# Patient Record
Sex: Female | Born: 1960 | Race: Black or African American | Hispanic: No | Smoking: Never smoker
Health system: Southern US, Community
[De-identification: ages and names within clinical notes are randomized; demographics above are authoritative.]

## PROBLEM LIST (undated history)

## (undated) DIAGNOSIS — E0590 Thyrotoxicosis, unspecified without thyrotoxic crisis or storm: Secondary | ICD-10-CM

## (undated) DIAGNOSIS — J309 Allergic rhinitis, unspecified: Secondary | ICD-10-CM

## (undated) DIAGNOSIS — N9489 Other specified conditions associated with female genital organs and menstrual cycle: Secondary | ICD-10-CM

## (undated) DIAGNOSIS — N760 Acute vaginitis: Secondary | ICD-10-CM

## (undated) DIAGNOSIS — L732 Hidradenitis suppurativa: Secondary | ICD-10-CM

## (undated) DIAGNOSIS — K5790 Diverticulosis of intestine, part unspecified, without perforation or abscess without bleeding: Secondary | ICD-10-CM

## (undated) DIAGNOSIS — I10 Essential (primary) hypertension: Secondary | ICD-10-CM

## (undated) DIAGNOSIS — D649 Anemia, unspecified: Secondary | ICD-10-CM

## (undated) DIAGNOSIS — K219 Gastro-esophageal reflux disease without esophagitis: Secondary | ICD-10-CM

## (undated) DIAGNOSIS — B9689 Other specified bacterial agents as the cause of diseases classified elsewhere: Secondary | ICD-10-CM

## (undated) DIAGNOSIS — E559 Vitamin D deficiency, unspecified: Secondary | ICD-10-CM

## (undated) DIAGNOSIS — F419 Anxiety disorder, unspecified: Secondary | ICD-10-CM

## (undated) DIAGNOSIS — M069 Rheumatoid arthritis, unspecified: Secondary | ICD-10-CM

## (undated) DIAGNOSIS — N189 Chronic kidney disease, unspecified: Secondary | ICD-10-CM

## (undated) DIAGNOSIS — E669 Obesity, unspecified: Secondary | ICD-10-CM

## (undated) DIAGNOSIS — T8859XA Other complications of anesthesia, initial encounter: Secondary | ICD-10-CM

## (undated) HISTORY — PX: CHOLECYSTECTOMY: SHX55

## (undated) HISTORY — DX: Chronic kidney disease, unspecified: N18.9

## (undated) HISTORY — DX: Obesity, unspecified: E66.9

## (undated) HISTORY — DX: Diverticulosis of intestine, part unspecified, without perforation or abscess without bleeding: K57.90

## (undated) HISTORY — DX: Other specified bacterial agents as the cause of diseases classified elsewhere: B96.89

## (undated) HISTORY — DX: Anemia, unspecified: D64.9

## (undated) HISTORY — DX: Essential (primary) hypertension: I10

## (undated) HISTORY — DX: Other specified conditions associated with female genital organs and menstrual cycle: N94.89

## (undated) HISTORY — PX: DILATION AND CURETTAGE OF UTERUS: SHX78

## (undated) HISTORY — DX: Vitamin D deficiency, unspecified: E55.9

## (undated) HISTORY — PX: AXILLARY HIDRADENITIS EXCISION: SUR522

## (undated) HISTORY — DX: Gastro-esophageal reflux disease without esophagitis: K21.9

## (undated) HISTORY — DX: Allergic rhinitis, unspecified: J30.9

## (undated) HISTORY — DX: Acute vaginitis: N76.0

## (undated) HISTORY — DX: Rheumatoid arthritis, unspecified: M06.9

---

## 1998-07-18 ENCOUNTER — Emergency Department (HOSPITAL_COMMUNITY): Admission: EM | Admit: 1998-07-18 | Discharge: 1998-07-18 | Payer: Self-pay

## 2000-04-11 ENCOUNTER — Emergency Department (HOSPITAL_COMMUNITY): Admission: EM | Admit: 2000-04-11 | Discharge: 2000-04-11 | Payer: Self-pay | Admitting: *Deleted

## 2001-05-02 ENCOUNTER — Emergency Department (HOSPITAL_COMMUNITY): Admission: EM | Admit: 2001-05-02 | Discharge: 2001-05-02 | Payer: Self-pay | Admitting: Emergency Medicine

## 2001-06-12 ENCOUNTER — Other Ambulatory Visit: Admission: RE | Admit: 2001-06-12 | Discharge: 2001-06-12 | Payer: Self-pay | Admitting: Obstetrics and Gynecology

## 2001-09-30 ENCOUNTER — Emergency Department (HOSPITAL_COMMUNITY): Admission: EM | Admit: 2001-09-30 | Discharge: 2001-09-30 | Payer: Self-pay | Admitting: Emergency Medicine

## 2002-03-20 ENCOUNTER — Emergency Department (HOSPITAL_COMMUNITY): Admission: EM | Admit: 2002-03-20 | Discharge: 2002-03-20 | Payer: Self-pay | Admitting: Emergency Medicine

## 2002-06-24 ENCOUNTER — Other Ambulatory Visit: Admission: RE | Admit: 2002-06-24 | Discharge: 2002-06-24 | Payer: Self-pay | Admitting: Obstetrics and Gynecology

## 2002-07-13 ENCOUNTER — Emergency Department (HOSPITAL_COMMUNITY): Admission: EM | Admit: 2002-07-13 | Discharge: 2002-07-13 | Payer: Self-pay | Admitting: Emergency Medicine

## 2002-09-26 ENCOUNTER — Emergency Department (HOSPITAL_COMMUNITY): Admission: EM | Admit: 2002-09-26 | Discharge: 2002-09-26 | Payer: Self-pay | Admitting: Emergency Medicine

## 2002-09-26 ENCOUNTER — Encounter: Payer: Self-pay | Admitting: Emergency Medicine

## 2003-09-15 ENCOUNTER — Other Ambulatory Visit: Admission: RE | Admit: 2003-09-15 | Discharge: 2003-09-15 | Payer: Self-pay | Admitting: Obstetrics and Gynecology

## 2004-09-17 ENCOUNTER — Other Ambulatory Visit: Admission: RE | Admit: 2004-09-17 | Discharge: 2004-09-17 | Payer: Self-pay | Admitting: Obstetrics and Gynecology

## 2004-12-03 ENCOUNTER — Encounter: Admission: RE | Admit: 2004-12-03 | Discharge: 2004-12-03 | Payer: Self-pay | Admitting: Family Medicine

## 2005-09-19 ENCOUNTER — Other Ambulatory Visit: Admission: RE | Admit: 2005-09-19 | Discharge: 2005-09-19 | Payer: Self-pay | Admitting: Obstetrics and Gynecology

## 2005-12-05 ENCOUNTER — Encounter: Admission: RE | Admit: 2005-12-05 | Discharge: 2005-12-05 | Payer: Self-pay | Admitting: Family Medicine

## 2006-01-06 ENCOUNTER — Emergency Department (HOSPITAL_COMMUNITY): Admission: EM | Admit: 2006-01-06 | Discharge: 2006-01-06 | Payer: Self-pay | Admitting: Emergency Medicine

## 2006-04-22 ENCOUNTER — Encounter (INDEPENDENT_AMBULATORY_CARE_PROVIDER_SITE_OTHER): Payer: Self-pay | Admitting: Specialist

## 2006-04-22 ENCOUNTER — Ambulatory Visit (HOSPITAL_BASED_OUTPATIENT_CLINIC_OR_DEPARTMENT_OTHER): Admission: RE | Admit: 2006-04-22 | Discharge: 2006-04-22 | Payer: Self-pay | Admitting: General Surgery

## 2006-12-08 ENCOUNTER — Encounter: Admission: RE | Admit: 2006-12-08 | Discharge: 2006-12-08 | Payer: Self-pay | Admitting: Family Medicine

## 2007-12-15 ENCOUNTER — Encounter: Admission: RE | Admit: 2007-12-15 | Discharge: 2007-12-15 | Payer: Self-pay | Admitting: Family Medicine

## 2008-07-15 ENCOUNTER — Ambulatory Visit (HOSPITAL_COMMUNITY): Admission: RE | Admit: 2008-07-15 | Discharge: 2008-07-15 | Payer: Self-pay | Admitting: Obstetrics and Gynecology

## 2008-07-15 ENCOUNTER — Encounter (INDEPENDENT_AMBULATORY_CARE_PROVIDER_SITE_OTHER): Payer: Self-pay | Admitting: Obstetrics and Gynecology

## 2008-07-15 HISTORY — PX: HYSTEROSCOPY W/D&C: SHX1775

## 2008-10-02 ENCOUNTER — Emergency Department (HOSPITAL_COMMUNITY): Admission: EM | Admit: 2008-10-02 | Discharge: 2008-10-03 | Payer: Self-pay | Admitting: Emergency Medicine

## 2008-10-28 ENCOUNTER — Emergency Department (HOSPITAL_COMMUNITY): Admission: EM | Admit: 2008-10-28 | Discharge: 2008-10-28 | Payer: Self-pay | Admitting: Emergency Medicine

## 2008-11-25 ENCOUNTER — Emergency Department (HOSPITAL_COMMUNITY): Admission: EM | Admit: 2008-11-25 | Discharge: 2008-11-25 | Payer: Self-pay | Admitting: Emergency Medicine

## 2008-12-15 ENCOUNTER — Encounter: Admission: RE | Admit: 2008-12-15 | Discharge: 2008-12-15 | Payer: Self-pay | Admitting: Family Medicine

## 2009-01-02 ENCOUNTER — Ambulatory Visit (HOSPITAL_COMMUNITY): Admission: RE | Admit: 2009-01-02 | Discharge: 2009-01-02 | Payer: Self-pay | Admitting: General Surgery

## 2009-01-02 ENCOUNTER — Encounter (INDEPENDENT_AMBULATORY_CARE_PROVIDER_SITE_OTHER): Payer: Self-pay | Admitting: General Surgery

## 2009-05-13 ENCOUNTER — Emergency Department (HOSPITAL_COMMUNITY): Admission: EM | Admit: 2009-05-13 | Discharge: 2009-05-14 | Payer: Self-pay | Admitting: Emergency Medicine

## 2009-10-22 ENCOUNTER — Emergency Department (HOSPITAL_COMMUNITY): Admission: EM | Admit: 2009-10-22 | Discharge: 2009-10-22 | Payer: Self-pay | Admitting: Family Medicine

## 2009-11-23 ENCOUNTER — Ambulatory Visit (HOSPITAL_COMMUNITY): Admission: RE | Admit: 2009-11-23 | Discharge: 2009-11-23 | Payer: Self-pay | Admitting: Obstetrics and Gynecology

## 2009-11-23 HISTORY — PX: HYSTEROSCOPY W/ ENDOMETRIAL ABLATION: SUR665

## 2010-01-01 ENCOUNTER — Encounter: Admission: RE | Admit: 2010-01-01 | Discharge: 2010-01-01 | Payer: Self-pay | Admitting: Family Medicine

## 2010-01-08 ENCOUNTER — Emergency Department (HOSPITAL_COMMUNITY)
Admission: EM | Admit: 2010-01-08 | Discharge: 2010-01-08 | Payer: Self-pay | Source: Home / Self Care | Admitting: Family Medicine

## 2010-06-08 ENCOUNTER — Ambulatory Visit: Payer: Self-pay | Admitting: Nurse Practitioner

## 2010-06-08 DIAGNOSIS — E669 Obesity, unspecified: Secondary | ICD-10-CM

## 2010-06-08 DIAGNOSIS — A6000 Herpesviral infection of urogenital system, unspecified: Secondary | ICD-10-CM | POA: Insufficient documentation

## 2010-06-08 DIAGNOSIS — L732 Hidradenitis suppurativa: Secondary | ICD-10-CM

## 2010-06-08 DIAGNOSIS — K219 Gastro-esophageal reflux disease without esophagitis: Secondary | ICD-10-CM | POA: Insufficient documentation

## 2010-06-13 ENCOUNTER — Encounter (INDEPENDENT_AMBULATORY_CARE_PROVIDER_SITE_OTHER): Payer: Self-pay | Admitting: Internal Medicine

## 2010-06-14 ENCOUNTER — Encounter (INDEPENDENT_AMBULATORY_CARE_PROVIDER_SITE_OTHER): Payer: Self-pay | Admitting: Nurse Practitioner

## 2010-06-22 ENCOUNTER — Telehealth (INDEPENDENT_AMBULATORY_CARE_PROVIDER_SITE_OTHER): Payer: Self-pay | Admitting: Nurse Practitioner

## 2010-07-01 ENCOUNTER — Encounter: Payer: Self-pay | Admitting: Family Medicine

## 2010-07-09 ENCOUNTER — Telehealth (INDEPENDENT_AMBULATORY_CARE_PROVIDER_SITE_OTHER): Payer: Self-pay | Admitting: Nurse Practitioner

## 2010-07-11 ENCOUNTER — Encounter: Payer: Self-pay | Admitting: Internal Medicine

## 2010-07-11 ENCOUNTER — Telehealth (INDEPENDENT_AMBULATORY_CARE_PROVIDER_SITE_OTHER): Payer: Self-pay | Admitting: Internal Medicine

## 2010-07-12 ENCOUNTER — Encounter (INDEPENDENT_AMBULATORY_CARE_PROVIDER_SITE_OTHER): Payer: Self-pay | Admitting: Internal Medicine

## 2010-07-12 NOTE — Letter (Signed)
Summary: REQUESTING RECORDS FROM DR. DEAN MITCHELL  REQUESTING RECORDS FROM DR. DEAN MITCHELL   Imported By: Roland Earl 06/14/2010 11:58:55  _____________________________________________________________________  External Attachment:    Type:   Image     Comment:   External Document

## 2010-07-12 NOTE — Assessment & Plan Note (Signed)
Summary: NEW - Establish care   Vital Signs:  Patient profile:   50 year old female Menstrual status:  endometrial abilation LMP:     12/2009 Height:      63 inches Weight:      260.0 pounds BMI:     46.22 Temp:     97.6 degrees F oral Pulse rate:   88 / minute Pulse rhythm:   regular Resp:     20 per minute BP sitting:   126 / 100  (left arm) Cuff size:   regular  Vitals Entered By: Isla Pence (June 08, 2010 11:32 AM)  Nutrition Counseling: Patient's BMI is greater than 25 and therefore counseled on weight management options. CC: new establish.......Marland Kitchenhidradentis and yeast infection, Abdominal Pain Is Patient Diabetic? No Pain Assessment Patient in pain? no       Does patient need assistance? Functional Status Self care Ambulation Normal LMP (date): 12/2009     Menstrual Status endometrial abilation Enter LMP: 12/2009 Last PAP Result historical per pt   CC:  new establish.......Marland Kitchenhidradentis and yeast infection and Abdominal Pain.  History of Present Illness:  Pt into the office to establish care. Pt was displaced from her job in September. She was working at [employer] as a Research scientist (physical sciences).  Pt was previously going to several specialist: [facility] - Dr. Francesca Oman OB-GYN - Dr. Raphael Gibney Dermatology - Dr. Allyson Sabal  Dyspepsia History:      She has no alarm features of dyspepsia including no history of melena, hematochezia, dysphagia, persistent vomiting, or involuntary weight loss > 5%.  There is a prior history of GERD.  The patient does not have a prior history of documented ulcer disease.  The dominant symptom is heartburn or acid reflux.  An H-2 blocker medication is not currently being taken.     Habits & Providers  Alcohol-Tobacco-Diet     Alcohol drinks/day: 0     Tobacco Status: never  Exercise-Depression-Behavior     Does Patient Exercise: yes     Exercise Counseling: not indicated; exercise is adequate     Type of exercise:  [employer]     Have you felt down or hopeless? no     Have you felt little pleasure in things? no     Depression Counseling: not indicated; screening negative for depression     Drug Use: never  Current Medications (verified): 1)  Alprazolam 0.5 Mg Tabs (Alprazolam) .... Take One Tablet By Mouth 3 Times A Day As Needed *lewis Mitchell 2)  Valacyclovir Hcl 500 Mg Tabs (Valacyclovir Hcl) .... Take One Tablet By Mouth Twice A Day If Needed For Outbreak For 3 Days Then Take 1 Tablet By Mouth Once Daily *carol Amber 3)  Ibuprofen 800 Mg Tabs (Ibuprofen) .... Take One Tablet By Mouth 3 Times A Day If Needed For Pain *dean Alroy Dust 4)  Pantoprazole Sodium 40 Mg Tbec (Pantoprazole Sodium) .... Take One Tablet By Mouth Every Day Donnie Coffin 5)  Ultra Woman  Cr-Tabs (Specialty Vitamins Products) .... Take One Tablet By Mouth Daily  Allergies (verified): No Known Drug Allergies  Family History: mother - htn, diabetes (deceased at age 60) father - htn (deceased at age 33) brother - htn Son - sickle Cell, leukemia (remission)  Social History: 1 child  widow denies any tobacco, ETOH or drug useSmoking Status:  never Drug Use:  never Does Patient Exercise:  yes  Review of Systems CV:  Denies chest pain or discomfort. Resp:  Denies cough.  GI:  Denies abdominal pain, nausea, and vomiting. GU:  Complains of discharge. MS:  Complains of joint pain; pt is taking ibuprofen as needed for aches.. Derm:  Complains of itching and lesion(s).  Physical Exam  General:  alert.   Head:  normocephalic.   Lungs:  normal breath sounds.   Heart:  normal rate and regular rhythm.   Skin:  color normal.   Psych:  Oriented X3.     Impression & Recommendations:  Problem # 1:  HIDRADENITIS SUPPURATIVA (ICD-705.83) handout given pt to take antibiotics will also advise pt to start betasept  Problem # 2:  ELEVATED BLOOD PRESSURE (ICD-796.2) DASH diet no current meds but advised pt to  monitor  Problem # 3:  OBESITY (ICD-278.00) pt to start going to the gym and decrease weight  Problem # 4:  NEED PROPHYLACTIC VACCINATION&INOCULATION FLU (ICD-V04.81) given today in office  Problem # 5:  CANDIDIASIS OF VULVA AND VAGINA (ICD-112.1)  Her updated medication list for this problem includes:    Fluconazole 150 Mg Tabs (Fluconazole) ..... One tablet by mouth x 1 dose  Problem # 6:  GERD (ICD-530.81)  Her updated medication list for this problem includes:    Pantoprazole Sodium 40 Mg Tbec (Pantoprazole sodium) ..... One tablet by mouth for stomach  Complete Medication List: 1)  Alprazolam 0.5 Mg Tabs (Alprazolam) .... One tablet by mouth daily as needed for anxiety 2)  Valacyclovir Hcl 500 Mg Tabs (Valacyclovir hcl) .... One tablet by mouth two times a day for outbreak and then daily for suppression 3)  Ibuprofen 800 Mg Tabs (Ibuprofen) .... Take one tablet by mouth 3 times a day if needed for pain *dean mitchell 4)  Pantoprazole Sodium 40 Mg Tbec (Pantoprazole sodium) .... One tablet by mouth for stomach 5)  Ultra Woman Cr-tabs (Specialty vitamins products) .... Take one tablet by mouth daily 6)  Cephalexin 500 Mg Caps (Cephalexin) .... One capsule by mouth two times a day  **rx by dr. Florene Glen** 7)  Fluconazole 150 Mg Tabs (Fluconazole) .... One tablet by mouth x 1 dose  Other Orders: Flu Vaccine 38yrs + [id number]) Admin 1st Vaccine [id number])  Patient Instructions: 1)  Be sure to sign a release to get records from Dr. [facility]. 2)  Your blood pressure is elevated today.  You should continue to monitor.  If high on next visit then you will need to start on medications. 3)  You should decrease sodium in your diet. 4)  Keep taking your antibiotics for your arms. 5)  You can get Betasept over the counter and apply to underarms.  let this sit for 5 mintutes then rinse off. 6)  Follow up in 3 months for blood pressure. Work on lifestyle  changes. Prescriptions: FLUCONAZOLE 150 MG TABS (FLUCONAZOLE) One tablet by mouth x 1 dose  #1 x 0   Entered and Authorized by:   Aurora Mask FNP   Signed by:   Aurora Mask FNP on 06/08/2010   Method used:   Print then Give to Patient   RxID:   [id number]    Orders Added: 1)  New Patient Level IV [id number] 2)  Flu Vaccine 28yrs + [id number] 3)  Admin 1st Vaccine [id number]   Immunizations Administered:  Influenza Vaccine # 1:    Vaccine Type: Fluvax 3+    Site: right deltoid    Mfr: GlaxoSmithKline    Dose: 0.5 ml    Route: IM    Given by: Hassan Rowan  Craddock    Exp. Date: 12/08/2010    Lot #: [id number]    VIS given: 01/02/10 version given June 08, 2010.  Flu Vaccine Consent Questions:    Do you have a history of severe allergic reactions to this vaccine? no    Any prior history of allergic reactions to egg and/or gelatin? no    Do you have a sensitivity to the preservative Thimersol? no    Do you have a past history of Guillan-Barre Syndrome? no    Do you currently have an acute febrile illness? no    Have you ever had a severe reaction to latex? no    Vaccine information given and explained to patient? yes    Are you currently pregnant? no    ndc  [phone]  Immunizations Administered:  Influenza Vaccine # 1:    Vaccine Type: Fluvax 3+    Site: right deltoid    Mfr: GlaxoSmithKline    Dose: 0.5 ml    Route: IM    Given by: Isla Pence    Exp. Date: 12/08/2010    Lot #: [id number]    VIS given: 01/02/10 version given June 08, 2010.

## 2010-07-12 NOTE — Progress Notes (Signed)
Summary: Wants refill of fluconazole  Phone Note Call from Patient   Summary of Call: REFILL ON THE ANTIBBIOTIC THAT MARTIN PUT HER ON THE 31ST//[facility]//[phone] Initial call taken by: Roland Earl,  June 22, 2010 9:51 AM  Follow-up for Phone Call        Left message on voicemail for pt. to return call.  Sherian Maroon RN  June 22, 2010 6:37 PM  Wants refill of fluconazole, still taking antibiotic.  Says she has yeast infection -- has itching, clear discharge, little odor.  Denied urinary symptoms.   Follow-up by: Sherian Maroon RN,  June 26, 2010 10:47 AM  Additional Follow-up for Phone Call Additional follow up Details #1::        Pt will need nurse visit - no wet prep done on last visit but is she is still having symptoms she will need it this time before med is refilled Additional Follow-up by: Aurora Mask FNP,  June 26, 2010 11:26 AM    Additional Follow-up for Phone Call Additional follow up Details #2::    Pt. states that she is drinking lots of water and it has relieved her symptoms.  She will call back if symptoms reoccur and she needs to be seen.  Advised re use of cotton or cotton-lined undergarments, to wash from front to back with perineal hygiene, don't douche and to avoid scented feminine products, soaps, lotions and powders.  Verbalized understanding and agreement.  Sherian Maroon RN  June 27, 2010 2:33 PM

## 2010-07-12 NOTE — Letter (Signed)
Summary: Handout Printed  Printed Handout:  - Hidradenitis Suppurativa, Sweat Gland Abscess

## 2010-07-18 NOTE — Progress Notes (Signed)
  Phone Note Outgoing Call   Summary of Call: Nora--derm referral Initial call taken by: Mack Hook MD,  July 11, 2010 1:14 PM

## 2010-07-18 NOTE — Assessment & Plan Note (Signed)
Summary: Lesions on body    Vital Signs:  Patient profile:   50 year old female Menstrual status:  endometrial abilation Weight:      259.44 pounds Temp:     97.0 degrees F oral Pulse rate:   106 / minute Pulse rhythm:   regular Resp:     28 per minute BP sitting:   142 / 104  (left arm) Cuff size:   regular  Vitals Entered By: Aquilla Solian CMA (July 11, 2010 11:39 AM) CC: Here for lesions on body that are draining. Very painful. It is a ongoing thing. Also having pain on right shoulder and left leg. Glands on neck are swollen.  Is Patient Diabetic? No Pain Assessment Patient in pain? yes     Location: Right shoulder  Intensity: 2 Type: aching Onset of pain  Constant  Does patient need assistance? Functional Status Self care Ambulation Normal   CC:  Here for lesions on body that are draining. Very painful. It is a ongoing thing. Also having pain on right shoulder and left leg. Glands on neck are swollen. Marland Kitchen  History of Present Illness: 1.  Hidradenitis suppuritiva:  Having lesions draining from axilla and vaginal area.  Has chronic issues with this .  States she is using Economist.  Lesions came up soon after finishing Cephalexin.  Having lots of pain with this and Ibuprofen just isn't helping  Current Medications (verified): 1)  Alprazolam 0.5 Mg Tabs (Alprazolam) .... One Tablet By Mouth Daily As Needed For Anxiety 2)  Valacyclovir Hcl 500 Mg Tabs (Valacyclovir Hcl) .... One Tablet By Mouth Two Times A Day For Outbreak and Then Daily For Suppression 3)  Ibuprofen 800 Mg Tabs (Ibuprofen) .... Take One Tablet By Mouth 3 Times A Day If Needed For Pain *dean Alroy Dust 4)  Pantoprazole Sodium 40 Mg Tbec (Pantoprazole Sodium) .... One Tablet By Mouth For Stomach 5)  Ultra Woman  Cr-Tabs (Specialty Vitamins Products) .... Take One Tablet By Mouth Daily 6)  Fluconazole 150 Mg Tabs (Fluconazole) .... One Tablet By Mouth X 1 Dose  Allergies (verified): No Known Drug  Allergies  Physical Exam  Skin:  Right axilla with granulomatous like tissue extending out from wound opening amongst lots of scar tissue.  Watery, bloody drainage. Left axilla without inflammation or drainage--lots of scar tissue. Perineal/groin/vulvar, buttock area with innumberable areas of  erythema and swelling and drainage of watery purulent and bloody fluid.  Swab taken of discharge expelled from right groin lesion.  All of this in background of lots of scarring.   Impression & Recommendations:  Problem # 1:  HIDRADENITIS SUPPURATIVA (ICD-705.83)  Significant flair Stop Betasept  Continue warm water soaks Start Bactrim DS Fluconazole if develops yeast vaginitis.  Orders: T-Culture, Wound ([id number]) Dermatology Referral ([facility])  Complete Medication List: 1)  Alprazolam 0.5 Mg Tabs (Alprazolam) .... One tablet by mouth daily as needed for anxiety 2)  Valacyclovir Hcl 500 Mg Tabs (Valacyclovir hcl) .... One tablet by mouth two times a day for outbreak and then daily for suppression 3)  Ibuprofen 800 Mg Tabs (Ibuprofen) .... Take one tablet by mouth 3 times a day if needed for pain *dean mitchell 4)  Pantoprazole Sodium 40 Mg Tbec (Pantoprazole sodium) .... One tablet by mouth for stomach 5)  Ultra Woman Cr-tabs (Specialty vitamins products) .... Take one tablet by mouth daily 6)  Fluconazole 150 Mg Tabs (Fluconazole) .... One tablet by mouth for one day if develop yeast infection 7)  Sulfamethoxazole-tmp Ds 800-160 Mg Tabs (Sulfamethoxazole-trimethoprim) .Marland Kitchen.. 1 tab by mouth two times a day for 14 days 8)  Tramadol Hcl 50 Mg Tabs (Tramadol hcl) .Marland Kitchen.. 1 tab by mouth two times a day for pain --use regularly until inflammation decreased  Patient Instructions: 1)  Referral to [facility] 2)  Follow up with Dr. Amil Amen in 1 week--same Prescriptions: TRAMADOL HCL 50 MG TABS (TRAMADOL HCL) 1 tab by mouth two times a day for pain --use regularly until inflammation decreased   #30 x 0   Entered and Authorized by:   Mack Hook MD   Signed by:   Mack Hook MD on 07/11/2010   Method used:   Print then Give to Patient   RxID:   [id number] FLUCONAZOLE 150 MG TABS (FLUCONAZOLE) One tablet by mouth for one day if develop yeast infection  #2 x 0   Entered and Authorized by:   Mack Hook MD   Signed by:   Mack Hook MD on 07/11/2010   Method used:   Print then Give to Patient   RxID:   [id number] SULFAMETHOXAZOLE-TMP DS 800-160 MG TABS (SULFAMETHOXAZOLE-TRIMETHOPRIM) 1 tab by mouth two times a day for 14 days  #28 x 0   Entered and Authorized by:   Mack Hook MD   Signed by:   Mack Hook MD on 07/11/2010   Method used:   Print then Give to Patient   RxID:   [id number]    Orders Added: 1)  Est. Patient Level III [id number] 2)  T-Culture, Wound [[id number]] 3)  Dermatology Referral [[facility]]

## 2010-07-18 NOTE — Progress Notes (Signed)
Summary: Requesting antibiotic  Phone Note Call from Patient   Caller: Patient Summary of Call: Pt. has vaginal lesions was given antibiotics with some improvement. Lesions have now opened back up, so irritated she can't sit down. Requesting more antibiotics will also need med for yeast inf. Not currently having problem but says once she completes antibiotics she will have one. Review of chart indicates Dr. Florene Glen had ordered antibiotic for her. Only seen here once 06/08/10. Advised pt to schedule an appt. Initial call taken by: Bridgett Larsson RN,  July 09, 2010 4:10 PM

## 2010-07-19 ENCOUNTER — Telehealth (INDEPENDENT_AMBULATORY_CARE_PROVIDER_SITE_OTHER): Payer: Self-pay | Admitting: Internal Medicine

## 2010-07-19 ENCOUNTER — Encounter: Payer: Self-pay | Admitting: Internal Medicine

## 2010-07-19 ENCOUNTER — Encounter (INDEPENDENT_AMBULATORY_CARE_PROVIDER_SITE_OTHER): Payer: Self-pay | Admitting: Internal Medicine

## 2010-07-20 ENCOUNTER — Telehealth (INDEPENDENT_AMBULATORY_CARE_PROVIDER_SITE_OTHER): Payer: Self-pay | Admitting: Internal Medicine

## 2010-07-26 ENCOUNTER — Encounter (INDEPENDENT_AMBULATORY_CARE_PROVIDER_SITE_OTHER): Payer: Self-pay | Admitting: Nurse Practitioner

## 2010-07-26 NOTE — Assessment & Plan Note (Signed)
Summary: 1 week f/u per dr. Amil Amen   Vital Signs:  Patient profile:   50 year old female Menstrual status:  endometrial abilation Weight:      258.19 pounds Temp:     96.8 degrees F oral Pulse rate:   92 / minute Pulse rhythm:   regular Resp:     24 per minute BP sitting:   112 / 84  (left arm) Cuff size:   regular  Vitals Entered By: Aquilla Solian CMA (July 19, 2010 4:13 PM) CC: 1 week f/u on Hidradentis. [place]erm. [place].  Is Patient Diabetic? No Pain Assessment Patient in pain? no       Does patient need assistance? Functional Status Self care Ambulation Normal   CC:  1 week f/u on Hidradentis. Has an appt. w/Derm. [place]. Marland Kitchen  History of Present Illness: 1.  Hidradenitis suppuritiva:  Still with some drainage, but improved--able to sit now.  Is doing the sitz baths two times a day for 30 minutes.  No fevers. Pt. has been treated with tetracyclines for prolonged courses in past without improvement.  She has had a response to Erythromycin.  Cannot recall if ever used Clindamycin.  Has never had any anti adrogenic medication or other hormonal manipulation she can recall.    2.  Yeast vaginitis:  already used dose of Fluconazole and with another 5 days of Bactrim left, having symptoms again--itching mainly.  Current Medications (verified): 1)  Alprazolam 0.5 Mg Tabs (Alprazolam) .... One Tablet By Mouth Daily As Needed For Anxiety 2)  Valacyclovir Hcl 500 Mg Tabs (Valacyclovir Hcl) .... One Tablet By Mouth Two Times A Day For Outbreak and Then Daily For Suppression 3)  Ibuprofen 800 Mg Tabs (Ibuprofen) .... Take One Tablet By Mouth 3 Times A Day If Needed For Pain *dean Alroy Dust 4)  Pantoprazole Sodium 40 Mg Tbec (Pantoprazole Sodium) .... One Tablet By Mouth For Stomach 5)  Ultra Woman  Cr-Tabs (Specialty Vitamins Products) .... Take One Tablet By Mouth Daily 6)  Fluconazole 150 Mg Tabs (Fluconazole) .... One Tablet By Mouth For One Day If Develop Yeast  Infection 7)  Sulfamethoxazole-Tmp Ds 800-160 Mg Tabs (Sulfamethoxazole-Trimethoprim) .Marland Kitchen.. 1 Tab By Mouth Two Times A Day For 14 Days 8)  Tramadol Hcl 50 Mg Tabs (Tramadol Hcl) .Marland Kitchen.. 1 Tab By Mouth Two Times A Day For Pain --Use Regularly Until Inflammation Decreased  Allergies (verified): No Known Drug Allergies  Physical Exam  General:  Morbidly obese. Skin:  Right axilla still with granulation like tissue from small opening draining scant clear liquid.  No surrounding erythema. Vulva, perineal, inguinal and buttock area with less gross inflammation, but still with many individual lesions--deep furuncles, suspect some with fistulous tracts between.     Impression & Recommendations:  Problem # 1:  HIDRADENITIS SUPPURATIVA (ICD-705.83) Mildly improved Switch to Erythromycin Stop Bactrim once start above. Spironolactone as an anti androgen Clindamycin topically two times a day  Benzoyl peroxide 10% wash two times a day   Complete Medication List: 1)  Alprazolam 0.5 Mg Tabs (Alprazolam) .... One tablet by mouth daily as needed for anxiety 2)  Valacyclovir Hcl 500 Mg Tabs (Valacyclovir hcl) .... One tablet by mouth two times a day for outbreak and then daily for suppression 3)  Ibuprofen 800 Mg Tabs (Ibuprofen) .... Take one tablet by mouth 3 times a day if needed for pain *dean mitchell 4)  Pantoprazole Sodium 40 Mg Tbec (Pantoprazole sodium) .... One tablet by mouth for  stomach 5)  Ultra Woman Cr-tabs (Specialty vitamins products) .... Take one tablet by mouth daily 6)  Fluconazole 150 Mg Tabs (Fluconazole) .... One tablet by mouth for one day if develop yeast infection 7)  Sulfamethoxazole-tmp Ds 800-160 Mg Tabs (Sulfamethoxazole-trimethoprim) .Marland Kitchen.. 1 tab by mouth two times a day for 14 days 8)  Tramadol Hcl 50 Mg Tabs (Tramadol hcl) .Marland Kitchen.. 1 tab by mouth two times a day for pain --use regularly until inflammation decreased 9)  Clindamycin Phosphate 1 % Soln (Clindamycin phosphate) ....  Apply two times a day to affected areas 10)  Spironolactone 50 Mg Tabs (Spironolactone) .Marland Kitchen.. 1 tab by mouth at bedtime 11)  Ery-tab 500 Mg Tbec (Erythromycin base) .Marland Kitchen.. 1 tab by mouth three times a day for 1 month-stop bactrim when you start this.  Patient Instructions: 1)  Benzoyl peroxide 10 wash to affected areas two times a day  2)  Wash off thoroughly. 3)  Follow up with Dr. Amil Amen in 1 month  Prescriptions: ERY-TAB 500 MG TBEC (ERYTHROMYCIN BASE) 1 tab by mouth three times a day for 1 month-stop Bactrim when you start this.  #90 x 0   Entered and Authorized by:   Mack Hook MD   Signed by:   Mack Hook MD on 07/19/2010   Method used:   Faxed to ...       [facility] (retail)       [place], [place]  [address]       Ph: [phone]       Fax: [phone]   RxID:   [id number] FLUCONAZOLE 150 MG TABS (FLUCONAZOLE) One tablet by mouth for one day if develop yeast infection  #2 x 0   Entered and Authorized by:   Mack Hook MD   Signed by:   Mack Hook MD on 07/19/2010   Method used:   Faxed to ...       [facility] (retail)       [place], [place]  [address]       Ph: [phone]       Fax: [phone]   RxID:   [id number] CLINDAMYCIN PHOSPHATE 1 % SOLN (CLINDAMYCIN PHOSPHATE) apply two times a day to affected areas  #60 ml x 6   Entered and Authorized by:   Mack Hook MD   Signed by:   Mack Hook MD on 07/19/2010   Method used:   Faxed to ...       [facility] (retail)       [place], [place]  [address]       Ph: [phone] [place]       Fax: [phone]   RxID:   [id number] SPIRONOLACTONE 50 MG TABS (SPIRONOLACTONE) 1 tab by mouth at bedtime  #30 x 1   Entered and Authorized by:   Mack Hook MD   Signed by:   Mack Hook  MD on 07/19/2010   Method used:   Faxed to ...       [facility] (retail)       [place], [place]  [address]       Ph: [phone]       Fax: [phone]   RxID:   [id number] CLINDAMYCIN PHOSPHATE  1 % SOLN (CLINDAMYCIN PHOSPHATE) apply two times a day to affected areas  #60 ml x 6   Entered and Authorized by:   Mack Hook MD   Signed by:   Mack Hook MD on 07/19/2010   Method used:   Print then Give to Patient   RxID:   [id number]    Orders Added: 1)  Est. Patient Level II [id number]

## 2010-07-31 ENCOUNTER — Encounter (INDEPENDENT_AMBULATORY_CARE_PROVIDER_SITE_OTHER): Payer: Self-pay | Admitting: Internal Medicine

## 2010-08-01 NOTE — Letter (Signed)
Summary: RECORDS RECEIVED FROM DR.[place] DR.DEAN MITCHELL   Imported By: Roland Earl 07/27/2010 09:20:19  _____________________________________________________________________  External Attachment:    Type:   Image     Comment:   External Document

## 2010-08-01 NOTE — Miscellaneous (Signed)
Summary: [facility]x [facility]  Clinical Lists Changes  Full chart from [facility] to be scanned in EMR  Problems: Added new problem of ANXIETY (ICD-300.00) Added new problem of OSTEOARTHRITIS, GENERALIZED (ICD-715.00) Observations: Added new observation of MAMMOGRAM: negative (12/15/2007 15:00)

## 2010-08-01 NOTE — Progress Notes (Signed)
Summary: Antibiotic  Phone Note From Pharmacy   Summary of Call: [facility] and said that they don't have the ery-tabs.... Shirl Harris says there is really nothing to sub for it Initial call taken by: [place] Shannon,  July 20, 2010 8:50 AM  Follow-up for Phone Call        They don't have any from or erythromycin? Follow-up by: Mack Hook MD,  July 20, 2010 8:57 AM  Additional Follow-up for Phone Call Additional follow up Details #1::        Doxy, Cipro, Septra, Clindamycin are all available. No longer carry erythromycin due to cost. Bridgett Larsson RN  July 20, 2010 11:03 AM  [facility] and [facility] have Erthromycin however it cost $70 for a 7 day supply Additional Follow-up by: Bridgett Larsson RN,  July 20, 2010 12:11 PM    Additional Follow-up for Phone Call Additional follow up Details #2::    pt called again to see if antibotic med was sent to pharmacy.Marland KitchenMarland KitchenThailand Shannon  July 20, 2010 4:56 PM Please let pt. know on Monday to go pick up new abx as we discussed at [place].  Unable to get the ERythromycin--so will try the Doxycycline instead.  Mack Hook MD  July 21, 2010 9:38 AM   Left message on answer machine for pt. to return call. Bridgett Larsson RN  July 24, 2010 9:15 AM  Pt. aware picked up medications yesterday. Bridgett Larsson RN  July 24, 2010 10:26 AM       New/Updated Medications: DOXYCYCLINE HYCLATE 100 MG TABS (DOXYCYCLINE HYCLATE) 1 tab by mouth two times a day for 30 days Prescriptions: DOXYCYCLINE HYCLATE 100 MG TABS (DOXYCYCLINE HYCLATE) 1 tab by mouth two times a day for 30 days  #60 x 0   Entered and Authorized by:   Mack Hook MD   Signed by:   Mack Hook MD on 07/21/2010   Method used:   Faxed to ...       [facility] (retail)       [address] [place], [place]  [address]       Ph: [phone]       Fax: [phone]   RxID:    [id number]

## 2010-08-07 NOTE — Progress Notes (Signed)
Summary: Needs appt.  Phone Note Outgoing Call   Summary of Call: Please call pt. and have her come in for bp check and BMet in 2 weeks after starting Spironolactone. Initial call taken by: Mack Hook MD,  July 19, 2010 6:33 PM  Follow-up for Phone Call        Left message on answer machine for pt. to return call. Bridgett Larsson RN  July 20, 2010 3:30 PM   Dr. Amil Amen -- Called pt. -- She states that the new Rx (Spironolactone) is making her sick -- She is taking 2 pills at bedtime since it's 25mg  -- when she wakes up, it makes her feel nauseas to the point that she is vomiting, loss of appetite and feeling dizzy. -- Took it on Thur, Fri, and Sat. and has stopped it -- Do you still want her to cont. the meds and have her  come in for BP check and BMET?? -- [place]  July 23, 2010 2:09 PM    Additional Follow-up for Phone Call Additional follow up Details #1::        See if she can try just 25 mg at bedtime.  If unable to tolerate the lower dose, just stop.   Have her return for bp check and BMET 1 week after starting the lower dose. To call if unable to even tolerate the lower dose. Additional Follow-up by: Mack Hook MD,  July 30, 2010 1:15 PM    Additional Follow-up for Phone Call Additional follow up Details #2::    Left message on answer machine for pt. to return call. Bridgett Larsson RN  July 31, 2010 1:40 PM  advised pt that once she decreases her dosage of medication for 1 week and is able to tolerate it. We need to schedule her for BP check and BP. Pt advised that if she is not able to take to please contact office asap. ................Marland KitchenMordecai Maes CMA  July 31, 2010 3:28 PM

## 2010-08-16 NOTE — Letter (Signed)
Summary: [facility]   Imported By: Roland Earl 08/08/2010 09:52:21  _____________________________________________________________________  External Attachment:    Type:   Image     Comment:   External Document

## 2010-08-26 LAB — CBC
HCT: 29.2 % — ABNORMAL LOW (ref 36.0–46.0)
Hemoglobin: 9.6 g/dL — ABNORMAL LOW (ref 12.0–15.0)
MCHC: 32.9 g/dL (ref 30.0–36.0)
Platelets: 531 10*3/uL — ABNORMAL HIGH (ref 150–400)
RDW: 16.2 % — ABNORMAL HIGH (ref 11.5–15.5)

## 2010-09-11 LAB — CBC
MCHC: 32.2 g/dL (ref 30.0–36.0)
RDW: 20.8 % — ABNORMAL HIGH (ref 11.5–15.5)

## 2010-09-11 LAB — POCT CARDIAC MARKERS
CKMB, poc: 2.7 ng/mL (ref 1.0–8.0)
Myoglobin, poc: 254 ng/mL (ref 12–200)
Troponin i, poc: <0.05 ng/mL (ref 0.00–0.09)

## 2010-09-11 LAB — BASIC METABOLIC PANEL
BUN: 10 mg/dL (ref 6–23)
Calcium: 8.9 mg/dL (ref 8.4–10.5)
GFR calc non Af Amer: >60 mL/min (ref >60)
Glucose, Bld: 93 mg/dL (ref 70–99)
Sodium: 137 mEq/L (ref 135–145)

## 2010-09-11 LAB — DIFFERENTIAL
Basophils Absolute: 0.3 10*3/uL — ABNORMAL HIGH (ref 0.0–0.1)
Basophils Relative: 4 % — ABNORMAL HIGH (ref 0–1)
Neutro Abs: 3 10*3/uL (ref 1.7–7.7)
Neutrophils Relative %: 49 % (ref 43–77)

## 2010-09-16 LAB — CBC
HCT: 29.2 % — ABNORMAL LOW (ref 36.0–46.0)
Hemoglobin: 9.3 g/dL — ABNORMAL LOW (ref 12.0–15.0)
Platelets: 500 10*3/uL — ABNORMAL HIGH (ref 150–400)
WBC: 5.2 10*3/uL (ref 4.0–10.5)

## 2010-09-16 LAB — DIFFERENTIAL
Basophils Absolute: 0.1 10*3/uL (ref 0.0–0.1)
Eosinophils Absolute: 0.2 10*3/uL (ref 0.0–0.7)
Lymphocytes Relative: 37 % (ref 12–46)
Lymphs Abs: 1.9 10*3/uL (ref 0.7–4.0)
Monocytes Absolute: 0.3 10*3/uL (ref 0.1–1.0)
Neutro Abs: 2.7 10*3/uL (ref 1.7–7.7)

## 2010-09-16 LAB — COMPREHENSIVE METABOLIC PANEL
ALT: 10 U/L (ref 0–35)
AST: 15 U/L (ref 0–37)
CO2: 24 mEq/L (ref 19–32)
Chloride: 108 mEq/L (ref 96–112)
Creatinine, Ser: 0.99 mg/dL (ref 0.4–1.2)
GFR calc non Af Amer: >60 mL/min (ref >60)
Total Bilirubin: 0.2 mg/dL — ABNORMAL LOW (ref 0.3–1.2)

## 2010-09-16 LAB — URINALYSIS, ROUTINE W REFLEX MICROSCOPIC
Glucose, UA: NEGATIVE mg/dL
Protein, ur: NEGATIVE mg/dL
pH: 6 (ref 5.0–8.0)

## 2010-09-19 LAB — POCT I-STAT, CHEM 8
Calcium, Ion: 1.17 mmol/L (ref 1.12–1.32)
Glucose, Bld: 86 mg/dL (ref 70–99)
HCT: 33 % — ABNORMAL LOW (ref 36.0–46.0)
Hemoglobin: 11.2 g/dL — ABNORMAL LOW (ref 12.0–15.0)
TCO2: 24 mmol/L (ref 0–100)

## 2010-09-25 LAB — CBC
Platelets: 565 10*3/uL — ABNORMAL HIGH (ref 150–400)
RDW: 17.2 % — ABNORMAL HIGH (ref 11.5–15.5)

## 2010-10-23 NOTE — Op Note (Signed)
Elizabeth Griffin, Elizabeth Griffin               ACCOUNT NO.:  [id number]   MEDICAL RECORD NO.:  [id number]          PATIENT TYPE:  AMB   LOCATION:  DAY                          FACILITY:  [facility]   PHYSICIAN:  Marland Kitchen T. Hoxworth, M.D.DATE OF BIRTH:  Sep 25, 1960   DATE OF PROCEDURE:  01/02/2009  DATE OF DISCHARGE:                               OPERATIVE REPORT   PREOPERATIVE DIAGNOSIS:  Severe hidradenitis, left axilla.   POSTOPERATIVE DIAGNOSIS:  Severe hidradenitis, left axilla.   SURGICAL PROCEDURE:  Extensive excision of hidradenitis, left axilla.   SURGEON:  Darene Lamer. Hoxworth, M.D.   ANESTHESIA:  General.   BRIEF HISTORY:  Ms. Hearst is a 50 year old female with a long history of  extensive severe bilateral axillary hidradenitis.  I had previously done  a wide extensive excision of the right axilla in November of 2007 with  good long-term results.  She now presents with persistent and increasing  chronic infection on the left side with multiple chronic draining sinus  tracts throughout the axilla extending up over the edge of the  pectoralis and toward the back.  We have discussed options and elected  to proceed with excision of this area.  It encompasses at least a 10 x 5  cm area.  The nature of the procedure, indications, risks of bleeding,  infection, wound healing problems, recurrence, anesthetic complications  were discussed and understood.  She is now brought to operating room for  this procedure.   DESCRIPTION OF OPERATION:  The patient was brought to the operating  room, placed in the supine position on the operating room table where  general orotracheal anesthesia was induced.  The left arm was carefully  positioned extended on arm board with the shoulder bumped up, exposing  the axilla.  Hair was clipped.  There area was widely sterilely prepped  and draped with Betadine.  She received preoperative IV vancomycin.  An  anterior-posterior oriented elliptical excision was  planned encompassing  all the hair bearing area of the axilla and the multiple draining sinus  tracts.  This involved approximately a 15 cm length excision x 7 or 8 cm  in width.  The elliptical skin incision was performed and then  dissection was carried down to the deep subcutaneous tissue with  cautery.  Anteriorly, dissection was carried down onto the pectoralis  fascia and then the specimen was lifted up off of the pectoralis fascia  and the lateral border of the pectoralis major.  There was inflammation  extending down to the low axilla and the excision was carried down  across the low axilla.  Several perforating vessels were divided between  clamps.  The final incision was carried down toward the posterior axilla  and back in the deep subcutaneous and the specimen removed.  There was  no evidence of any of chronic sinus tracts or abscesses left behind.  Several vessels were tied with 3-0 Vicryl and suture ligated and  complete hemostasis obtained.  The area was copiously irrigated with  saline and gloves changed.  Following this, the wound was closed in  layers with deep subcutaneous  interrupted 3-0 Vicryl  and then skin was closed with interrupted simple and mattress sutures of  3-0 nylon.  The skin came together nicely with no undue tension.  The  sponge, needle and instrument counts were correct.  Gauze dressing was  applied.  The patient was taken to the recovery room in good condition.      Darene Lamer. Hoxworth, M.D.  Electronically Signed     BTH/MEDQ  D:  01/02/2009  T:  01/02/2009  Job:  [id number]

## 2010-10-23 NOTE — Op Note (Signed)
Elizabeth Griffin, Elizabeth Griffin               ACCOUNT NO.:  [id number]   MEDICAL RECORD NO.:  [id number]          PATIENT TYPE:  AMB   LOCATION:  SDC                           FACILITY:  [facility]   PHYSICIAN:  Everett Graff, M.D.   DATE OF BIRTH:  [birth year]   DATE OF PROCEDURE:  07/15/2008  DATE OF DISCHARGE:                               OPERATIVE REPORT   PREOPERATIVE DIAGNOSIS:  Menometrorrhagia.   POSTOPERATIVE DIAGNOSIS:  Menometrorrhagia.   PROCEDURE:  1. Hysteroscopy.  2. Dilation and curettage.   ATTENDING DOCTOR:  Everett Graff, MD   ANESTHESIA:  General via LMA.   SPECIMENS TO PATHOLOGY:  Endometrial curettings.   FLUIDS:  1200 mL.   URINE OUTPUT:  Quantity sufficient via straight cath prior to procedure.   ESTIMATED BLOOD LOSS:  Minimal.   COMPLICATIONS:  None.   PROCEDURE:  The patient was taken to the operating room after risks,  benefits, and alternatives were discussed with the patient, the patient  verbalized understanding, and consent signed and witnessed.  The patient  was placed under general by Anesthesia and prepped and draped in a  normal sterile fashion in the dorsal lithotomy position.  A bivalve  speculum was placed in the patient's vagina and the anterior lip of the  cervix was grasped with a single-tooth tenaculum.  A paracervical block  was administered using a total of 10 mL of 1% lidocaine.  The uterus  sounded to approximately 10.5 cm and the cervix was dilated for passage  of the hysteroscope.  The hysteroscope was introduced and no obvious  intracavitary lesions were noted.  However, there was large amount of  clots and some tissue in the uterine cavity.  Curettage was performed  and a gritty texture noted.  The hysteroscope was reintroduced and no  obvious intracavitary lesions were noted.  Endometrial curettings were  sent to pathology.  All instruments  were removed.  There was some bleeding at the tenaculum sites, which was  made hemostatic with  silver nitrate.  Count was correct.  The patient  tolerated the procedure well and is currently awaiting transfer to the  recovery room in good condition.      Everett Graff, M.D.  Electronically Signed     AR/MEDQ  D:  07/15/2008  T:  07/16/2008  Job:  [id number]

## 2010-10-26 NOTE — Op Note (Signed)
NAMESHAMONICA, GOULDING               ACCOUNT NO.:  [id number]   MEDICAL RECORD NO.:  [id number]          PATIENT TYPE:  AMB   LOCATION:  [place]                          FACILITY:  [facility]   PHYSICIAN:  Marland Kitchen T. Hoxworth, M.D.DATE OF BIRTH:  Oct 19, 1960   DATE OF PROCEDURE:  04/22/2006  DATE OF DISCHARGE:                               OPERATIVE REPORT   PREOPERATIVE DIAGNOSIS:  Hidradenitis right axilla.   POSTOPERATIVE DIAGNOSIS:  Hidradenitis right axilla.   SURGICAL PROCEDURE:  Excision hidradenitis right axilla.   SURGEON:  Darene Lamer. Hoxworth, M.D.   ANESTHESIA:  General.   BRIEF HISTORY:  Elizabeth Griffin is a 39-year black female with a long  history of a bilateral axillary hidradenitis.  She had a previous  central excision of hair-bearing skin on the right about 10 years ago.  She now presents with persistent draining sinuses very troublesome to  her, unresponsive medical management which have recurred both anterior  and posterior to the previous excision site.  Centrally, the skin  appears normal in the axilla.  I have recommended proceeding with  excision of the two separate areas anterior and posterior in the right  axilla of chronic hidradenitis.  The nature of procedure indications,  risks of bleeding, infection and recurrence were discussed and  understood.  She is now brought to the operating room for this  procedure.   DESCRIPTION OF PROCEDURE:  Patient brought to the operating room, placed  in supine position on operating able and general orotracheal anesthesia  was induced.  The right arm was carefully positioned extended on arm  board and the right arm and axilla sterilely prepped and draped.  She  received IV vancomycin preoperatively.  Correct patient and procedure  were verified.  In each case, an elliptical excision of skin  encompassing all the draining sinus tracts back to healthy skin were  performed.  These were again two separate areas anterior and  posterior  in the right axilla.  Each measured approximately 6-7 cm in length x 3-4  cm in width.  The excisions were carried down with cautery into the deep  subcutaneous tissue and the specimens removed.  Hemostasis obtained with  cautery.  Each wound was then closed with running mattress suture of 3-0  nylon.  Sponge, needle and instrument counts were correct.  Dry sterile  dressings were applied and the patient was taken to the recovery room in  good condition.      Darene Lamer. Hoxworth, M.D.  Electronically Signed     BTH/MEDQ  D:  04/22/2006  T:  04/22/2006  Job:  [id number]

## 2010-10-27 ENCOUNTER — Emergency Department (HOSPITAL_COMMUNITY)
Admission: EM | Admit: 2010-10-27 | Discharge: 2010-10-28 | Disposition: A | Discharge status: Home / Self Care | Payer: Medicaid Other | Attending: Emergency Medicine | Admitting: Emergency Medicine

## 2010-10-27 DIAGNOSIS — IMO0001 Reserved for inherently not codable concepts without codable children: Secondary | ICD-10-CM | POA: Insufficient documentation

## 2010-10-27 DIAGNOSIS — N949 Unspecified condition associated with female genital organs and menstrual cycle: Secondary | ICD-10-CM | POA: Insufficient documentation

## 2010-10-27 DIAGNOSIS — L732 Hidradenitis suppurativa: Secondary | ICD-10-CM | POA: Insufficient documentation

## 2010-10-27 DIAGNOSIS — R6883 Chills (without fever): Secondary | ICD-10-CM | POA: Insufficient documentation

## 2010-10-27 DIAGNOSIS — K219 Gastro-esophageal reflux disease without esophagitis: Secondary | ICD-10-CM | POA: Insufficient documentation

## 2010-10-27 DIAGNOSIS — E669 Obesity, unspecified: Secondary | ICD-10-CM | POA: Insufficient documentation

## 2010-11-20 ENCOUNTER — Inpatient Hospital Stay (INDEPENDENT_AMBULATORY_CARE_PROVIDER_SITE_OTHER)
Admission: RE | Admit: 2010-11-20 | Discharge: 2010-11-20 | Disposition: A | Discharge status: Home / Self Care | Payer: Self-pay | Source: Ambulatory Visit | Attending: Emergency Medicine | Admitting: Emergency Medicine

## 2010-11-20 DIAGNOSIS — L732 Hidradenitis suppurativa: Secondary | ICD-10-CM

## 2010-11-30 ENCOUNTER — Other Ambulatory Visit: Payer: Self-pay | Admitting: Family Medicine

## 2010-11-30 DIAGNOSIS — Z1231 Encounter for screening mammogram for malignant neoplasm of breast: Secondary | ICD-10-CM

## 2010-12-14 ENCOUNTER — Encounter (INDEPENDENT_AMBULATORY_CARE_PROVIDER_SITE_OTHER): Payer: Self-pay | Admitting: Surgery

## 2010-12-17 ENCOUNTER — Other Ambulatory Visit: Payer: Self-pay | Admitting: Family Medicine

## 2010-12-18 ENCOUNTER — Encounter (INDEPENDENT_AMBULATORY_CARE_PROVIDER_SITE_OTHER): Payer: Self-pay | Admitting: Surgery

## 2010-12-18 ENCOUNTER — Ambulatory Visit (INDEPENDENT_AMBULATORY_CARE_PROVIDER_SITE_OTHER): Payer: Self-pay | Admitting: Surgery

## 2010-12-18 VITALS — BP 134/90 | HR 88 | Temp 96.4°F | Ht 64.0 in | Wt 267.8 lb

## 2010-12-18 DIAGNOSIS — L732 Hidradenitis suppurativa: Secondary | ICD-10-CM

## 2010-12-18 NOTE — Progress Notes (Signed)
Subjective:     Patient ID: Elizabeth Griffin, female   DOB: 03/20/1961, 50 y.o.   MRN: [id number]    BP 134/90  Pulse 88  Temp 96.4 F (35.8 C)  Ht 5\' 4"  (1.626 m)  Wt 267 lb 12.8 oz (121.473 kg)  BMI 45.97 kg/m2    HPI  This is a 50 year old female referred for chronic hidradenitis. She has had multiple surgical procedures in the past for this. This has included both her axilla. She has had chronic areas on her right buttocks and right groin. She has been on intermittent antibiotics and steroids with minimal relief for several years. She currently denies fevers. She is still having moderate drainage. She is currently on antibiotics.  Past Medical History  Diagnosis Date  . Abdominal pain   . Nausea & vomiting   . Diarrhea   . GERD (gastroesophageal reflux disease)   . Allergy   . Anemia     Past Surgical History  Procedure Date  . Cholecystectomy   . Axillary lymph node dissection   . Laser ablation     uterous   No Known Allergies  History   Social History  . Marital Status: Widowed    Spouse Name: N/A    Number of Children: N/A  . Years of Education: N/A   Occupational History  . Not on file.   Social History Main Topics  . Smoking status: Never Smoker   . Smokeless tobacco: Not on file  . Alcohol Use: No  . Drug Use: No  . Sexually Active:    Other Topics Concern  . Not on file   Social History Narrative  . No narrative on file   Current outpatient prescriptions:ALPRAZolam (XANAX) 0.5 MG tablet, Take 0.5 mg by mouth 3 (three) times daily as needed.  , Disp: , Rfl: ;  ibuprofen (ADVIL,MOTRIN) 800 MG tablet, Take 800 mg by mouth every 8 (eight) hours as needed.  , Disp: , Rfl: ;  pantoprazole (PROTONIX) 40 MG tablet, Take 40 mg by mouth 3 (three) times daily.  , Disp: , Rfl: ;  traMADol (ULTRAM) 50 MG tablet, Take 50 mg by mouth every 6 (six) hours as needed.  , Disp: , Rfl:   Review of Systems  Constitutional: Negative.   HENT: Negative.   Respiratory:  Negative.   Cardiovascular: Negative.   Gastrointestinal: Negative.   Genitourinary: Negative.   Musculoskeletal: Negative.   Skin: Positive for wound.  Neurological: Negative.   Hematological: Negative.        Objective:    Physical Exam  Constitutional: She appears well-developed and well-nourished.  HENT:  Head: Normocephalic and atraumatic.  Right Ear: External ear normal.  Left Ear: External ear normal.  Mouth/Throat: Oropharynx is clear and moist.  Eyes: EOM are normal. Pupils are equal, round, and reactive to light. No scleral icterus.  Neck: Normal range of motion. Neck supple. No tracheal deviation present. No thyromegaly present.  Cardiovascular: Normal rate, regular rhythm and normal heart sounds.   No murmur heard. Pulmonary/Chest: Effort normal and breath sounds normal. No respiratory distress.  Abdominal: Soft. Bowel sounds are normal. She exhibits no distension. There is no tenderness.  Musculoskeletal: Normal range of motion.  Skin: Lesion noted.   Patient has extensive chronic hidradenitis involving the right groin and right buttocks over a large area   Assessment:        Chronic hidradenitis of the buttocks and groin Plan:     Because she has failed  conservative management and because this is causing her such discomfort she would like to have this area widely excised which I feel is reasonable. I would only do this as a stage procedure. I will do the buttocks area first. She understands that this procedure includes risks. These include but are not limited to bleeding, infection, injury, having a chronic large open wound, etc. Surgery will be scheduled.

## 2011-01-03 ENCOUNTER — Ambulatory Visit
Admission: RE | Admit: 2011-01-03 | Discharge: 2011-01-03 | Disposition: A | Discharge status: Home / Self Care | Payer: Medicaid Other | Source: Ambulatory Visit | Attending: Family Medicine | Admitting: Family Medicine

## 2011-01-03 DIAGNOSIS — Z1231 Encounter for screening mammogram for malignant neoplasm of breast: Secondary | ICD-10-CM

## 2011-01-15 ENCOUNTER — Encounter (HOSPITAL_COMMUNITY)
Admission: RE | Admit: 2011-01-15 | Discharge: 2011-01-15 | Disposition: A | Discharge status: Home / Self Care | Payer: Self-pay | Source: Ambulatory Visit | Attending: Surgery | Admitting: Surgery

## 2011-01-15 DIAGNOSIS — Z01818 Encounter for other preprocedural examination: Secondary | ICD-10-CM | POA: Insufficient documentation

## 2011-01-15 DIAGNOSIS — Z01812 Encounter for preprocedural laboratory examination: Secondary | ICD-10-CM | POA: Insufficient documentation

## 2011-01-15 LAB — CBC
HCT: 29.7 % — ABNORMAL LOW (ref 36.0–46.0)
MCV: 75.6 fL — ABNORMAL LOW (ref 78.0–100.0)
RBC: 3.93 MIL/uL (ref 3.87–5.11)
RDW: 18.4 % — ABNORMAL HIGH (ref 11.5–15.5)
WBC: 6.7 10*3/uL (ref 4.0–10.5)

## 2011-01-15 LAB — SURGICAL PCR SCREEN
MRSA, PCR: NEGATIVE
Staphylococcus aureus: NEGATIVE

## 2011-01-23 ENCOUNTER — Ambulatory Visit (HOSPITAL_COMMUNITY)
Admission: RE | Admit: 2011-01-23 | Discharge: 2011-01-23 | Disposition: A | Discharge status: Home / Self Care | Payer: Medicaid Other | Source: Ambulatory Visit | Attending: Surgery | Admitting: Surgery

## 2011-01-23 ENCOUNTER — Other Ambulatory Visit (INDEPENDENT_AMBULATORY_CARE_PROVIDER_SITE_OTHER): Payer: Self-pay | Admitting: Surgery

## 2011-01-23 DIAGNOSIS — E669 Obesity, unspecified: Secondary | ICD-10-CM | POA: Insufficient documentation

## 2011-01-23 DIAGNOSIS — Z01818 Encounter for other preprocedural examination: Secondary | ICD-10-CM | POA: Insufficient documentation

## 2011-01-23 DIAGNOSIS — Z01812 Encounter for preprocedural laboratory examination: Secondary | ICD-10-CM | POA: Insufficient documentation

## 2011-01-23 DIAGNOSIS — L732 Hidradenitis suppurativa: Secondary | ICD-10-CM

## 2011-01-23 DIAGNOSIS — K219 Gastro-esophageal reflux disease without esophagitis: Secondary | ICD-10-CM | POA: Insufficient documentation

## 2011-01-23 HISTORY — PX: PERINEAL HIDRADENITIS EXCISION: SUR524

## 2011-01-25 ENCOUNTER — Encounter (INDEPENDENT_AMBULATORY_CARE_PROVIDER_SITE_OTHER): Payer: Self-pay | Admitting: Surgery

## 2011-01-27 NOTE — Op Note (Signed)
  NAMEMAGIE, MAHLE               ACCOUNT NO.:  [id number]  MEDICAL RECORD NO.:  [id number]  LOCATION:  SDSC                         FACILITY:  [facility]  PHYSICIAN:  Coralie Keens, M.D. DATE OF BIRTH:  07/07/1960  DATE OF PROCEDURE:  01/23/2011 DATE OF DISCHARGE:  01/23/2011                              OPERATIVE REPORT   PREOPERATIVE DIAGNOSIS:  Chronic hidradenitis, right buttocks.  POSTOPERATIVE DIAGNOSIS:  Chronic hidradenitis, right buttocks.  PROCEDURE:  Wide excision (12 cm) hidradenitis right buttock.  SURGEON:  Coralie Keens, MD  ANESTHESIA:  General and injectable Exparel.  ESTIMATED BLOOD LOSS:  Minimal.  INDICATIONS:  This is a 50 year old female who has had significant hidradenitis in her groins and buttocks which has failed conservative management.  Decision was made to proceed with wide excision of the right buttocks.  PROCEDURE IN DETAIL:  The patient was brought to the operating room, identified as [employer].  The patient was then placed in a prone position on the operating room table after general anesthesia.  Her right buttock was then prepped and draped in the usual sterile fashion. I then performed a wide excision approximately 12 cm incised of all the areas of chronic drainage on the buttocks.  The incision was taken down into the deep subcutaneous tissue with electrocautery.  I completed the wide excision with the electrocautery removing all the inflamed and draining sinus tracts.  This tissue was then sent to pathology for evaluation.  I then irrigated the wound thoroughly with normal saline and achieved hemostasis with cautery.  I then injected Exparel circumferentially into the wound.  I placed a Penrose drain in the wound.  I then closed the incision with interrupted 2-0 nylon sutures. I placed a few 3-0 Vicryl sutures as well.  Gauze and tape were then applied over the incision.  The patient tolerated the procedure well. All sponge,  needle and instrument counts were correct at the end of procedure.  The patient was then extubated in the operating room and taken in stable condition to recovery room.     Coralie Keens, M.D.     DB/MEDQ  D:  01/23/2011  T:  01/23/2011  Job:  [id number]  Electronically Signed by Coralie Keens M.D. on 01/27/2011 05:40:24 PM

## 2011-01-29 ENCOUNTER — Ambulatory Visit (INDEPENDENT_AMBULATORY_CARE_PROVIDER_SITE_OTHER): Payer: PRIVATE HEALTH INSURANCE | Admitting: Surgery

## 2011-01-29 ENCOUNTER — Encounter (INDEPENDENT_AMBULATORY_CARE_PROVIDER_SITE_OTHER): Payer: Self-pay | Admitting: Surgery

## 2011-01-29 VITALS — Temp 96.9°F

## 2011-01-29 DIAGNOSIS — Z09 Encounter for follow-up examination after completed treatment for conditions other than malignant neoplasm: Secondary | ICD-10-CM

## 2011-01-29 NOTE — Progress Notes (Signed)
Subjective:     Patient ID: Elizabeth Griffin, female   DOB: 06-Jun-1961, 51 y.o.   MRN: [id number]  HPI She is here for her postoperative visit status post wide excision of hidradenitis on her right buttocks. She is doing moderately well. She is still on pain medication and antibiotics  Review of Systems     Objective:   Physical Exam On exam, her incision is healing well. The sutures are intact. I removed the Penrose drain.    Assessment:    Patient status post excision of lymphadenitis of the right buttocks     Plan:    She will continue her local wound care. I will see her back next week to see if the sutures can come out.

## 2011-02-01 ENCOUNTER — Encounter (INDEPENDENT_AMBULATORY_CARE_PROVIDER_SITE_OTHER): Payer: Self-pay | Admitting: Surgery

## 2011-02-05 ENCOUNTER — Ambulatory Visit (INDEPENDENT_AMBULATORY_CARE_PROVIDER_SITE_OTHER): Payer: PRIVATE HEALTH INSURANCE | Admitting: Surgery

## 2011-02-05 ENCOUNTER — Encounter (INDEPENDENT_AMBULATORY_CARE_PROVIDER_SITE_OTHER): Payer: Self-pay | Admitting: Surgery

## 2011-02-05 VITALS — BP 144/103 | HR 72

## 2011-02-05 DIAGNOSIS — Z09 Encounter for follow-up examination after completed treatment for conditions other than malignant neoplasm: Secondary | ICD-10-CM

## 2011-02-05 NOTE — Progress Notes (Signed)
Subjective:     Patient ID: Elizabeth Griffin, female   DOB: March 07, 1961, 50 y.o.   MRN: [id number]  HPI She is here for another postoperative visit. She is doing well. She has no complaints. Review of Systems     Objective:   Physical Exam   On exam, there is skin breakdown at several areas but no evidence of infection. I removed all the nylon sutures Assessment:        Patient status post excision of a large area of hidradenitis on her right buttock Plan:     She will start placing Neosporin on the open areas daily. I will see her back in 2

## 2011-02-25 ENCOUNTER — Ambulatory Visit (INDEPENDENT_AMBULATORY_CARE_PROVIDER_SITE_OTHER): Payer: Medicaid Other | Admitting: Surgery

## 2011-02-25 ENCOUNTER — Encounter (INDEPENDENT_AMBULATORY_CARE_PROVIDER_SITE_OTHER): Payer: Self-pay | Admitting: Surgery

## 2011-02-25 VITALS — BP 147/89 | HR 77

## 2011-02-25 DIAGNOSIS — Z09 Encounter for follow-up examination after completed treatment for conditions other than malignant neoplasm: Secondary | ICD-10-CM

## 2011-02-25 NOTE — Progress Notes (Signed)
Subjective:     Patient ID: Elizabeth Griffin, female   DOB: 1960/08/29, 50 y.o.   MRN: [id number]  HPI  She is here for another postoperative visit regarding her hidradenitis Review of Systems     Objective:   Physical Exam On exam, the wound continues to heal well. There are 2 small open areas which are treated with silver nitrate    Assessment:       Patient with extensive hidradenitis status post excision of the right buttocks Plan:        She will continue her local wound care. I will see her back in one month. She will call should she have a flareup in her groin

## 2011-03-07 DIAGNOSIS — D573 Sickle-cell trait: Secondary | ICD-10-CM | POA: Insufficient documentation

## 2011-03-07 DIAGNOSIS — F419 Anxiety disorder, unspecified: Secondary | ICD-10-CM | POA: Insufficient documentation

## 2011-03-28 ENCOUNTER — Encounter (INDEPENDENT_AMBULATORY_CARE_PROVIDER_SITE_OTHER): Payer: Self-pay | Admitting: Surgery

## 2011-04-01 ENCOUNTER — Ambulatory Visit (INDEPENDENT_AMBULATORY_CARE_PROVIDER_SITE_OTHER): Payer: Medicaid Other | Admitting: Surgery

## 2011-04-01 ENCOUNTER — Encounter (INDEPENDENT_AMBULATORY_CARE_PROVIDER_SITE_OTHER): Payer: Self-pay | Admitting: Surgery

## 2011-04-01 VITALS — BP 130/84 | HR 66 | Temp 96.7°F | Resp 16 | Ht 64.0 in | Wt 267.4 lb

## 2011-04-01 DIAGNOSIS — L732 Hidradenitis suppurativa: Secondary | ICD-10-CM

## 2011-04-01 NOTE — Progress Notes (Signed)
Subjective:     Patient ID: Elizabeth Griffin, female   DOB: April 02, 1961, 50 y.o.   MRN: [id number]  HPI She is here for another followup of her hidradenitis. She reports that the buttocks incision is healing well. She is having increasing discomfort in her right groin with some mild increased drainage.  Review of Systems     Objective:   Physical Exam    On exam, the buttocks incision is again well healed with only one tiny open area. She has active  drainage in multiples chronic sinus tracts of the right groin at the inguinal crease. There is only mild erythema Assessment:     Patient with chronic hidradenitis    Plan:       After discussion, she would like to go ahead and proceed with resection of the right groin/inguinal area with the active hidradenitis going on. Undergoing better on antibiotics preoperatively. I discussed the risks of surgery which includes bleeding, infection, wound breakdown with an open wound, etc. She again and understand that this is not a curable disease. Likelihood of a good outcome as moderate. Surgery will be scheduled

## 2011-04-11 ENCOUNTER — Encounter (INDEPENDENT_AMBULATORY_CARE_PROVIDER_SITE_OTHER): Payer: Self-pay | Admitting: Surgery

## 2011-04-23 ENCOUNTER — Emergency Department (HOSPITAL_COMMUNITY)
Admission: EM | Admit: 2011-04-23 | Discharge: 2011-04-23 | Disposition: A | Discharge status: Other Acute Inpatient Hospital | Payer: Medicaid Other | Source: Home / Self Care

## 2011-04-24 ENCOUNTER — Other Ambulatory Visit (INDEPENDENT_AMBULATORY_CARE_PROVIDER_SITE_OTHER): Payer: Self-pay | Admitting: General Surgery

## 2011-04-26 ENCOUNTER — Encounter (HOSPITAL_BASED_OUTPATIENT_CLINIC_OR_DEPARTMENT_OTHER): Payer: Self-pay | Admitting: *Deleted

## 2011-04-26 NOTE — Pre-Procedure Instructions (Signed)
States drainage from hidradenitis sites left buttock, left and right groin

## 2011-05-21 ENCOUNTER — Telehealth (INDEPENDENT_AMBULATORY_CARE_PROVIDER_SITE_OTHER): Payer: Self-pay | Admitting: General Surgery

## 2011-05-21 NOTE — Telephone Encounter (Signed)
Pt was called for a per op ov before sx for Hida buttock on 07/01/11 and a reminder card was mailed to pt

## 2011-05-30 ENCOUNTER — Ambulatory Visit (HOSPITAL_BASED_OUTPATIENT_CLINIC_OR_DEPARTMENT_OTHER): Admission: RE | Admit: 2011-05-30 | Payer: Medicaid Other | Source: Ambulatory Visit | Admitting: Surgery

## 2011-05-30 ENCOUNTER — Encounter (HOSPITAL_BASED_OUTPATIENT_CLINIC_OR_DEPARTMENT_OTHER): Admission: RE | Payer: Self-pay | Source: Ambulatory Visit

## 2011-05-30 HISTORY — DX: Anxiety disorder, unspecified: F41.9

## 2011-05-30 SURGERY — EXCISION, HIDRADENITIS, INGUINAL REGION
Anesthesia: General | Laterality: Right

## 2011-06-20 ENCOUNTER — Ambulatory Visit (INDEPENDENT_AMBULATORY_CARE_PROVIDER_SITE_OTHER): Payer: PRIVATE HEALTH INSURANCE | Admitting: Surgery

## 2011-06-20 ENCOUNTER — Encounter (INDEPENDENT_AMBULATORY_CARE_PROVIDER_SITE_OTHER): Payer: Self-pay | Admitting: Surgery

## 2011-06-20 VITALS — BP 138/96 | HR 80 | Temp 97.4°F | Resp 24 | Ht 63.0 in | Wt 263.6 lb

## 2011-06-20 DIAGNOSIS — L732 Hidradenitis suppurativa: Secondary | ICD-10-CM

## 2011-06-20 DIAGNOSIS — Z01818 Encounter for other preprocedural examination: Secondary | ICD-10-CM

## 2011-06-20 NOTE — Progress Notes (Signed)
Patient ID: Elizabeth Griffin, Elizabeth   DOB: [birth year], 51 y.o.   MRN: [id number]  Chief Complaint  Patient presents with  . Pre-op Exam    hidradenitis of groin    HPI Elizabeth Griffin is a 51 y.o. Elizabeth.   HPI She is here for a preoperative visit regarding excision of hidradenitis of the right groin. She has had no changes since I saw her last and has no complaints today Past Medical History  Diagnosis Date  . GERD (gastroesophageal reflux disease)   . Anemia   . Anxiety   . Sickle cell trait   . Hidradenitis     Past Surgical History  Procedure Date  . Cholecystectomy   . Axillary hidradenitis excision     right - 04/22/2006; left - 01/02/2009  . Perineal hidradenitis excision 01/23/2011    right buttock  . Hysteroscopy w/d&c 07/15/2008  . Hysteroscopy w/ endometrial ablation 11/23/2009    and D & C    Family History  Problem Relation Age of Onset  . Hypertension Brother   . Cancer Son     leukemia    Social History History  Substance Use Topics  . Smoking status: Never Smoker   . Smokeless tobacco: Never Used  . Alcohol Use: No    Allergies  Allergen Reactions  . Adhesive (Tape) Hives    Current Outpatient Prescriptions  Medication Sig Dispense Refill  . ALPRAZolam (XANAX) 0.5 MG tablet Take 0.5 mg by mouth as needed.       . ferrous sulfate 325 (65 FE) MG tablet Take 325 mg by mouth daily with breakfast.        . Fluconazole (DIFLUCAN PO) Take by mouth daily.       Marland Kitchen ibuprofen (ADVIL,MOTRIN) 800 MG tablet Take 800 mg by mouth every 8 (eight) hours as needed.        . pantoprazole (PROTONIX) 40 MG tablet Take 40 mg by mouth daily. AM      . traMADol (ULTRAM) 50 MG tablet Take 50 mg by mouth as needed.         Review of Systems Review of Systems Her review of systems is negative from a cardiopulmonary standpoint. Blood pressure 138/96, pulse 80, temperature 97.4 F (36.3 C), temperature source Temporal, resp. rate 24, height 5\' 3"  (1.6 m), weight 263 lb  9.3 oz (119.559 kg).  Physical Exam Physical Exam Lungs: Clear to auscultation bilaterally Cardiovascular: Regular rate and rhythm Right groin: Areas of hidradenitis with chronic draining sinus tracts Assessment    Right groin hidradenitis    Plan    I plan to widely excise the area in the right groin. I discussed the risk of surgery with her which includes but is not limited to bleeding, infection, wound breakdown with an open wound, and injury to structures, et Ronney Asters. She understands and wishes to proceed. Surgery has already been scheduled. Likelihood of success is moderate       Tynetta Bachmann A 06/20/2011, 2:31 PM

## 2011-07-25 ENCOUNTER — Encounter (HOSPITAL_BASED_OUTPATIENT_CLINIC_OR_DEPARTMENT_OTHER): Payer: Self-pay | Admitting: *Deleted

## 2011-07-25 NOTE — Progress Notes (Signed)
No labs needed

## 2011-07-31 ENCOUNTER — Encounter (HOSPITAL_BASED_OUTPATIENT_CLINIC_OR_DEPARTMENT_OTHER): Payer: Self-pay | Admitting: Anesthesiology

## 2011-07-31 ENCOUNTER — Encounter (HOSPITAL_BASED_OUTPATIENT_CLINIC_OR_DEPARTMENT_OTHER)
Admission: RE | Disposition: A | Discharge status: Home / Self Care | Payer: Self-pay | Source: Ambulatory Visit | Attending: Surgery

## 2011-07-31 ENCOUNTER — Ambulatory Visit (HOSPITAL_BASED_OUTPATIENT_CLINIC_OR_DEPARTMENT_OTHER): Payer: Medicaid Other | Admitting: Anesthesiology

## 2011-07-31 ENCOUNTER — Other Ambulatory Visit (INDEPENDENT_AMBULATORY_CARE_PROVIDER_SITE_OTHER): Payer: Self-pay | Admitting: Surgery

## 2011-07-31 ENCOUNTER — Encounter (HOSPITAL_BASED_OUTPATIENT_CLINIC_OR_DEPARTMENT_OTHER): Payer: Self-pay | Admitting: *Deleted

## 2011-07-31 ENCOUNTER — Ambulatory Visit (HOSPITAL_BASED_OUTPATIENT_CLINIC_OR_DEPARTMENT_OTHER)
Admission: RE | Admit: 2011-07-31 | Discharge: 2011-07-31 | Disposition: A | Discharge status: Home / Self Care | Payer: Medicaid Other | Source: Ambulatory Visit | Attending: Surgery | Admitting: Surgery

## 2011-07-31 DIAGNOSIS — L732 Hidradenitis suppurativa: Secondary | ICD-10-CM | POA: Insufficient documentation

## 2011-07-31 DIAGNOSIS — K219 Gastro-esophageal reflux disease without esophagitis: Secondary | ICD-10-CM | POA: Insufficient documentation

## 2011-07-31 HISTORY — PX: HYDRADENITIS EXCISION: SHX5243

## 2011-07-31 SURGERY — EXCISION, HIDRADENITIS, INGUINAL REGION
Anesthesia: General | Laterality: Right

## 2011-07-31 MED ORDER — CEFAZOLIN SODIUM-DEXTROSE 2-3 GM-% IV SOLR
2.0000 g | INTRAVENOUS | Status: AC
  Administered 2011-07-31: 2 g via INTRAVENOUS

## 2011-07-31 MED ORDER — ACETAMINOPHEN 10 MG/ML IV SOLN
1000.0000 mg | Freq: Once | INTRAVENOUS | Status: AC
  Administered 2011-07-31: 1000 mg via INTRAVENOUS

## 2011-07-31 MED ORDER — MIDAZOLAM HCL 2 MG/2ML IJ SOLN: 0.5000 mg | INTRAMUSCULAR | Status: DC | PRN

## 2011-07-31 MED ORDER — FLUCONAZOLE 150 MG PO TABS: 150.0000 mg | ORAL_TABLET | Freq: Once | ORAL | Status: AC

## 2011-07-31 MED ORDER — TRAMADOL HCL 50 MG PO TABS
50.0000 mg | ORAL_TABLET | Freq: Four times a day (QID) | ORAL | Status: AC | PRN
Start: 2011-07-31 — End: 2011-08-10

## 2011-07-31 MED ORDER — METOCLOPRAMIDE HCL 5 MG/ML IJ SOLN: 10.0000 mg | Freq: Once | INTRAMUSCULAR | Status: DC | PRN

## 2011-07-31 MED ORDER — MIDAZOLAM HCL 5 MG/5ML IJ SOLN
INTRAMUSCULAR | Status: DC | PRN
  Administered 2011-07-31: 2 mg via INTRAVENOUS

## 2011-07-31 MED ORDER — FENTANYL CITRATE 0.05 MG/ML IJ SOLN: 25.0000 ug | INTRAMUSCULAR | Status: DC | PRN

## 2011-07-31 MED ORDER — TRAMADOL HCL 50 MG PO TABS: 50.0000 mg | ORAL_TABLET | Freq: Four times a day (QID) | ORAL | Status: DC | PRN

## 2011-07-31 MED ORDER — PROMETHAZINE HCL 25 MG/ML IJ SOLN: 12.5000 mg | Freq: Four times a day (QID) | INTRAMUSCULAR | Status: DC | PRN

## 2011-07-31 MED ORDER — DEXAMETHASONE SODIUM PHOSPHATE 4 MG/ML IJ SOLN
INTRAMUSCULAR | Status: DC | PRN
  Administered 2011-07-31: 10 mg via INTRAVENOUS

## 2011-07-31 MED ORDER — BUPIVACAINE-EPINEPHRINE 0.5% -1:200000 IJ SOLN
INTRAMUSCULAR | Status: DC | PRN
  Administered 2011-07-31: 20 mL

## 2011-07-31 MED ORDER — KETOROLAC TROMETHAMINE 30 MG/ML IJ SOLN
INTRAMUSCULAR | Status: DC | PRN
  Administered 2011-07-31: 30 mg via INTRAVENOUS

## 2011-07-31 MED ORDER — FENTANYL CITRATE 0.05 MG/ML IJ SOLN
INTRAMUSCULAR | Status: DC | PRN
  Administered 2011-07-31: 100 ug via INTRAVENOUS
  Administered 2011-07-31 (×2): 25 ug via INTRAVENOUS

## 2011-07-31 MED ORDER — SODIUM CHLORIDE 0.9 % IV SOLN: 250.0000 mL | INTRAVENOUS | Status: DC | PRN

## 2011-07-31 MED ORDER — ACETAMINOPHEN 325 MG PO TABS: 650.0000 mg | ORAL_TABLET | ORAL | Status: DC | PRN

## 2011-07-31 MED ORDER — LACTATED RINGERS IV SOLN
INTRAVENOUS | Status: DC
  Administered 2011-07-31 (×2): via INTRAVENOUS

## 2011-07-31 MED ORDER — DROPERIDOL 2.5 MG/ML IJ SOLN
INTRAMUSCULAR | Status: DC | PRN
  Administered 2011-07-31: 0.625 mg via INTRAVENOUS

## 2011-07-31 MED ORDER — MORPHINE SULFATE 2 MG/ML IJ SOLN: 0.0500 mg/kg | INTRAMUSCULAR | Status: DC | PRN

## 2011-07-31 MED ORDER — OXYCODONE HCL 5 MG PO TABS: 5.0000 mg | ORAL_TABLET | ORAL | Status: DC | PRN

## 2011-07-31 MED ORDER — LIDOCAINE HCL (CARDIAC) 20 MG/ML IV SOLN
INTRAVENOUS | Status: DC | PRN
  Administered 2011-07-31: 60 mg via INTRAVENOUS

## 2011-07-31 MED ORDER — CEPHALEXIN 500 MG PO CAPS: 500.0000 mg | ORAL_CAPSULE | Freq: Three times a day (TID) | ORAL | Status: AC

## 2011-07-31 MED ORDER — ONDANSETRON HCL 4 MG/2ML IJ SOLN: 4.0000 mg | Freq: Four times a day (QID) | INTRAMUSCULAR | Status: DC | PRN

## 2011-07-31 MED ORDER — PROPOFOL 10 MG/ML IV EMUL
INTRAVENOUS | Status: DC | PRN
  Administered 2011-07-31: 300 mg via INTRAVENOUS

## 2011-07-31 MED ORDER — SODIUM CHLORIDE 0.9 % IJ SOLN: 3.0000 mL | INTRAMUSCULAR | Status: DC | PRN

## 2011-07-31 MED ORDER — ACETAMINOPHEN 650 MG RE SUPP: 650.0000 mg | RECTAL | Status: DC | PRN

## 2011-07-31 MED ORDER — SODIUM CHLORIDE 0.9 % IJ SOLN: 3.0000 mL | Freq: Two times a day (BID) | INTRAMUSCULAR | Status: DC

## 2011-07-31 MED ORDER — MORPHINE SULFATE 2 MG/ML IJ SOLN: 2.0000 mg | INTRAMUSCULAR | Status: DC | PRN

## 2011-07-31 SURGICAL SUPPLY — 38 items
BLADE HEX COATED 2.75 (ELECTRODE) ×2 IMPLANT
BLADE SURG 15 STRL LF DISP TIS (BLADE) ×1 IMPLANT
BLADE SURG 15 STRL SS (BLADE) ×2
BLADE SURG ROTATE 9660 (MISCELLANEOUS) ×1 IMPLANT
CANISTER SUCTION 1200CC (MISCELLANEOUS) ×2 IMPLANT
CHLORAPREP W/TINT 26ML (MISCELLANEOUS) IMPLANT
CLOTH BEACON ORANGE TIMEOUT ST (SAFETY) ×2 IMPLANT
COVER MAYO STAND STRL (DRAPES) ×2 IMPLANT
COVER TABLE BACK 60X90 (DRAPES) ×2 IMPLANT
DECANTER SPIKE VIAL GLASS SM (MISCELLANEOUS) IMPLANT
DRAPE PED LAPAROTOMY (DRAPES) ×2 IMPLANT
DRAPE UTILITY XL STRL (DRAPES) ×2 IMPLANT
ELECT REM PT RETURN 9FT ADLT (ELECTROSURGICAL) ×2
ELECTRODE REM PT RTRN 9FT ADLT (ELECTROSURGICAL) ×1 IMPLANT
GAUZE SPONGE 4X4 12PLY STRL LF (GAUZE/BANDAGES/DRESSINGS) ×2 IMPLANT
GLOVE SURG SIGNA 7.5 PF LTX (GLOVE) ×2 IMPLANT
GOWN PREVENTION PLUS XLARGE (GOWN DISPOSABLE) ×2 IMPLANT
GOWN PREVENTION PLUS XXLARGE (GOWN DISPOSABLE) ×2 IMPLANT
NDL HYPO 25X1 1.5 SAFETY (NEEDLE) ×1 IMPLANT
NEEDLE HYPO 25X1 1.5 SAFETY (NEEDLE) ×2 IMPLANT
NS IRRIG 1000ML POUR BTL (IV SOLUTION) ×2 IMPLANT
PACK BASIN DAY SURGERY FS (CUSTOM PROCEDURE TRAY) ×2 IMPLANT
PENCIL BUTTON HOLSTER BLD 10FT (ELECTRODE) ×2 IMPLANT
SLEEVE SCD COMPRESS KNEE MED (MISCELLANEOUS) IMPLANT
SPONGE GAUZE 4X4 12PLY (GAUZE/BANDAGES/DRESSINGS) ×1 IMPLANT
SPONGE LAP 18X18 X RAY DECT (DISPOSABLE) ×1 IMPLANT
SPONGE LAP 4X18 X RAY DECT (DISPOSABLE) ×1 IMPLANT
SUT ETHILON 2 0 FS 18 (SUTURE) ×12 IMPLANT
SUT ETHILON 3 0 FSL (SUTURE) IMPLANT
SUT VIC AB 2-0 SH 27 (SUTURE) ×2
SUT VIC AB 2-0 SH 27XBRD (SUTURE) IMPLANT
SYR BULB 3OZ (MISCELLANEOUS) ×1 IMPLANT
SYR CONTROL 10ML LL (SYRINGE) ×2 IMPLANT
TOWEL OR 17X24 6PK STRL BLUE (TOWEL DISPOSABLE) ×2 IMPLANT
TOWEL OR NON WOVEN STRL DISP B (DISPOSABLE) ×2 IMPLANT
TUBE CONNECTING 20X1/4 (TUBING) ×2 IMPLANT
WATER STERILE IRR 1000ML POUR (IV SOLUTION) ×1 IMPLANT
YANKAUER SUCT BULB TIP NO VENT (SUCTIONS) ×2 IMPLANT

## 2011-07-31 NOTE — Interval H&P Note (Signed)
History and Physical Interval Note: she has had no change in her history or exam  07/31/2011 6:26 AM  Elizabeth Griffin  has presented today for surgery, with the diagnosis of hidradenitis right groin  The various methods of treatment have been discussed with the patient and family. After consideration of risks, benefits and other options for treatment, the patient has consented to  Procedure(s) (LRB): EXCISION HYDRADENITIS GROIN (Right) as a surgical intervention .  The patients' history has been reviewed, patient examined, no change in status, stable for surgery.  I have reviewed the patients' chart and labs.  Questions were answered to the patient's satisfaction.     Cristofher Livecchi A

## 2011-07-31 NOTE — Anesthesia Preprocedure Evaluation (Signed)
Anesthesia Evaluation  Patient identified by MRN, date of birth, ID band Patient awake    Reviewed: Allergy & Precautions, H&P , NPO status , Patient's Chart, lab work & pertinent test results, reviewed documented beta blocker date and time   Airway Mallampati: II TM Distance: >3 FB Neck ROM: full    Dental   Pulmonary neg pulmonary ROS,          Cardiovascular neg cardio ROS     Neuro/Psych Negative Neurological ROS  Negative Psych ROS   GI/Hepatic negative GI ROS, Neg liver ROS, GERD-  Medicated and Controlled,  Endo/Other  Morbid obesity  Renal/GU negative Renal ROS  Genitourinary negative   Musculoskeletal   Abdominal   Peds  Hematology negative hematology ROS (+)   Anesthesia Other Findings See surgeon's H&P   Reproductive/Obstetrics negative OB ROS                           Anesthesia Physical Anesthesia Plan  ASA: III  Anesthesia Plan: General   Post-op Pain Management:    Induction: Intravenous  Airway Management Planned: LMA  Additional Equipment:   Intra-op Plan:   Post-operative Plan: Extubation in OR  Informed Consent: I have reviewed the patients History and Physical, chart, labs and discussed the procedure including the risks, benefits and alternatives for the proposed anesthesia with the patient or authorized representative who has indicated his/her understanding and acceptance.     Plan Discussed with: CRNA and Surgeon  Anesthesia Plan Comments:         Anesthesia Quick Evaluation

## 2011-07-31 NOTE — Op Note (Signed)
07/31/2011  9:17 AM  PATIENT:  Elizabeth Griffin  51 y.o. female  PRE-OPERATIVE DIAGNOSIS:  hidradenitis right groin  POST-OPERATIVE DIAGNOSIS:  hidradenitis right groin  PROCEDURE:  Procedure(s) (LRB): EXCISION HYDRADENITIS GROIN (Right)  SURGEON:  Surgeon(s) and Role:    * Harl Bowie, MD - Primary  PHYSICIAN ASSISTANT:   ASSISTANTS: none   ANESTHESIA:   general  EBL:  Total I/O In: 1300 [I.V.:1300] Out: -   BLOOD ADMINISTERED:none  DRAINS: none   LOCAL MEDICATIONS USED:  MARCAINE     SPECIMEN:  Excision  DISPOSITION OF SPECIMEN:  PATHOLOGY  COUNTS:  YES  TOURNIQUET:  * No tourniquets in log *  DICTATION: Viviann Spare Dictation Indications: This is a 51 year old female with chronic hidradenitis. She now wishes to proceed with excision of the chronic hidradenitis in her right groin.  Procedure: The patient was brought to the operating room identified as the correct patient. She was placed upon the operating room table and general anesthesia was induced. She was then placed in the frog-leg position. Her right groin was then prepped and draped in the usual sterile fashion. I anesthetized the skin with Marcaine. I then performed a wide elliptical incision along the inguinal ligament but proximal and distal and medial and lateral going all the way down to the labia. I did this into the subcutaneous tissue with electrocautery and completed a wide excision of all this obtained tissue and chronic draining sinus tract. Several large lymph nodes were removed the specimen. I tied off one vein with a Vicryl suture. The specimen was sent to pathology for evaluation. I thoroughly irrigated with saline and anesthetized for the Marcaine. I then closed subcutaneous tissue lightly with interrupted 2-0 Vicryl sutures. I then closed the skin with interrupted 2-0 nylon sutures. Gauze and tape were applied. The patient tolerated well. All the counts were correct at the end of the procedure. The  patient was then extubated in the operating room and taken in a stable condition to the recovery room. PLAN OF CARE: Discharge to home after PACU  PATIENT DISPOSITION:  PACU - hemodynamically stable.   Delay start of Pharmacological VTE agent (>24hrs) due to surgical blood loss or risk of bleeding: not applicable

## 2011-07-31 NOTE — Anesthesia Procedure Notes (Signed)
Procedure Name: LMA Insertion Date/Time: 07/31/2011 8:33 AM Performed by: Signa Kell Pre-anesthesia Checklist: Patient identified, Emergency Drugs available, Suction available and Patient being monitored Patient Re-evaluated:Patient Re-evaluated prior to inductionOxygen Delivery Method: Circle System Utilized Preoxygenation: Pre-oxygenation with 100% oxygen Intubation Type: IV induction Ventilation: Mask ventilation without difficulty LMA: LMA with gastric port inserted LMA Size: 4.0 Tube type: Oral Number of attempts: 1 Placement Confirmation: breath sounds checked- equal and bilateral and positive ETCO2 Tube secured with: Tape Dental Injury: Teeth and Oropharynx as per pre-operative assessment

## 2011-07-31 NOTE — Transfer of Care (Signed)
Immediate Anesthesia Transfer of Care Note  Patient: Elizabeth Griffin  Procedure(s) Performed: Procedure(s) (LRB): EXCISION HYDRADENITIS GROIN (Right)  Patient Location: PACU  Anesthesia Type: General  Level of Consciousness: awake and alert   Airway & Oxygen Therapy: Patient Spontanous Breathing and Patient connected to face mask oxygen  Post-op Assessment: Report given to PACU RN and Post -op Vital signs reviewed and stable  Post vital signs: Reviewed and stable  Complications: No apparent anesthesia complications

## 2011-07-31 NOTE — Anesthesia Postprocedure Evaluation (Signed)
Anesthesia Post Note  Patient: Elizabeth Griffin  Procedure(s) Performed: Procedure(s) (LRB): EXCISION HYDRADENITIS GROIN (Right)  Anesthesia type: General  Patient location: PACU  Post pain: Pain level controlled  Post assessment: Patient's Cardiovascular Status Stable  Last Vitals:  Filed Vitals:   07/31/11 1015  BP: 145/80  Pulse: 76  Temp:   Resp: 17    Post vital signs: Reviewed and stable  Level of consciousness: alert  Complications: No apparent anesthesia complications

## 2011-07-31 NOTE — H&P (Signed)
  Patient ID: Elizabeth Griffin, female DOB: [birth year], 51 y.o. MRN: [id number]  Chief Complaint   Patient presents with   .  Pre-op Exam     hidradenitis of groin    HPI  Elizabeth Griffin is a 51 y.o. female.  HPI  She is here for a preoperative visit regarding excision of hidradenitis of the right groin. She has had no changes since I saw her last and has no complaints today  Past Medical History   Diagnosis  Date   .  GERD (gastroesophageal reflux disease)    .  Anemia    .  Anxiety    .  Sickle cell trait    .  Hidradenitis     Past Surgical History   Procedure  Date   .  Cholecystectomy    .  Axillary hidradenitis excision      right - 04/22/2006; left - 01/02/2009   .  Perineal hidradenitis excision  01/23/2011     right buttock   .  Hysteroscopy w/d&c  07/15/2008   .  Hysteroscopy w/ endometrial ablation  11/23/2009     and D & C    Family History   Problem  Relation  Age of Onset   .  Hypertension  Brother    .  Cancer  Son       leukemia    Social History  History   Substance Use Topics   .  Smoking status:  Never Smoker   .  Smokeless tobacco:  Never Used   .  Alcohol Use:  No    Allergies   Allergen  Reactions   .  Adhesive (Tape)  Hives    Current Outpatient Prescriptions   Medication  Sig  Dispense  Refill   .  ALPRAZolam (XANAX) 0.5 MG tablet  Take 0.5 mg by mouth as needed.     .  ferrous sulfate 325 (65 FE) MG tablet  Take 325 mg by mouth daily with breakfast.     .  Fluconazole (DIFLUCAN PO)  Take by mouth daily.     Marland Kitchen  ibuprofen (ADVIL,MOTRIN) 800 MG tablet  Take 800 mg by mouth every 8 (eight) hours as needed.     .  pantoprazole (PROTONIX) 40 MG tablet  Take 40 mg by mouth daily. AM     .  traMADol (ULTRAM) 50 MG tablet  Take 50 mg by mouth as needed.      Review of Systems  Review of Systems  Her review of systems is negative from a cardiopulmonary standpoint.  Blood pressure 138/96, pulse 80, temperature 97.4 F (36.3 C), temperature source  Temporal, resp. rate 24, height 5\' 3"  (1.6 m), weight 263 lb 9.3 oz (119.559 kg).  Physical Exam  Physical Exam  Lungs: Clear to auscultation bilaterally  Cardiovascular: Regular rate and rhythm  Right groin: Areas of hidradenitis with chronic draining sinus tracts  Assessment   Right groin hidradenitis   Plan   I plan to widely excise the area in the right groin. I discussed the risk of surgery with her which includes but is not limited to bleeding, infection, wound breakdown with an open wound, and injury to structures, et Ronney Asters. She understands and wishes to proceed. Surgery has already been scheduled. Likelihood of success is moderate   Lorenzo Arscott A

## 2011-08-01 ENCOUNTER — Encounter (HOSPITAL_BASED_OUTPATIENT_CLINIC_OR_DEPARTMENT_OTHER): Payer: Self-pay | Admitting: Surgery

## 2011-08-13 ENCOUNTER — Encounter (INDEPENDENT_AMBULATORY_CARE_PROVIDER_SITE_OTHER): Payer: Self-pay | Admitting: Surgery

## 2011-08-13 ENCOUNTER — Ambulatory Visit (INDEPENDENT_AMBULATORY_CARE_PROVIDER_SITE_OTHER): Payer: PRIVATE HEALTH INSURANCE | Admitting: Surgery

## 2011-08-13 VITALS — BP 122/82 | HR 98 | Temp 98.4°F | Ht 63.0 in | Wt 267.6 lb

## 2011-08-13 DIAGNOSIS — Z09 Encounter for follow-up examination after completed treatment for conditions other than malignant neoplasm: Secondary | ICD-10-CM

## 2011-08-13 NOTE — Progress Notes (Signed)
Subjective:     Patient ID: Elizabeth Griffin, female   DOB: 11-18-1960, 51 y.o.   MRN: [id number]  HPI She is here for her postoperative visit status post excision of hidradenitis in the right groin. She is doing fairly well except for yeast infection. She is still on antibiotics  Review of Systems     Objective:   Physical Exam On exam, her incision is healing well except for the center portion of the sutures are pulled apart. I removed to the sutures. I left the rest of the sutures intact. There is no evidence of infection    Assessment:     Patient status post excision of hidradenitis in the right groin    Plan:     She will stop the Keflex. She will treat the open area with Neosporin. I removed her tramadol and Diflucan. I will see her back next week

## 2011-08-20 ENCOUNTER — Ambulatory Visit (INDEPENDENT_AMBULATORY_CARE_PROVIDER_SITE_OTHER): Payer: PRIVATE HEALTH INSURANCE | Admitting: Surgery

## 2011-08-20 ENCOUNTER — Encounter (INDEPENDENT_AMBULATORY_CARE_PROVIDER_SITE_OTHER): Payer: Self-pay | Admitting: Surgery

## 2011-08-20 VITALS — BP 134/88 | HR 72 | Temp 97.8°F | Resp 16 | Ht 63.0 in | Wt 266.0 lb

## 2011-08-20 DIAGNOSIS — Z09 Encounter for follow-up examination after completed treatment for conditions other than malignant neoplasm: Secondary | ICD-10-CM

## 2011-08-20 NOTE — Progress Notes (Signed)
Subjective:     Patient ID: Elizabeth Griffin, female   DOB: 10-Dec-1960, 51 y.o.   MRN: [id number]  HPI  She complains of some mild wound drainage but is otherwise doing wellReview of Systems     Objective:   Physical Exam The wound remains clean. There are several open areas in the right groin. There is no evidence of infection. I removed the rest of the sutures    Assessment:     Patient status post wide excision of hidradenitis in the right groin.    Plan:     She will dress the wound with dry bandages. I will see her back in 3 weeks

## 2011-08-26 ENCOUNTER — Telehealth (INDEPENDENT_AMBULATORY_CARE_PROVIDER_SITE_OTHER): Payer: Self-pay | Admitting: General Surgery

## 2011-08-26 NOTE — Telephone Encounter (Signed)
Elizabeth Griffin called and stated that she felt a suture in her groin area. She came in for a nurse visit, she had 3 suture this in the groin area, I removed the sutures and told her if she had any other problems to call me and I told her to keep her appt with Dr Ninfa Linden on the 09-13-11

## 2011-08-26 NOTE — Telephone Encounter (Signed)
Called pt and she need a refill on Fluconazole 150 mg #3. Ok the refill and faxed back to [facility]

## 2011-09-09 ENCOUNTER — Emergency Department (INDEPENDENT_AMBULATORY_CARE_PROVIDER_SITE_OTHER)
Admission: EM | Admit: 2011-09-09 | Discharge: 2011-09-09 | Disposition: A | Discharge status: Home / Self Care | Payer: Self-pay | Source: Home / Self Care | Attending: Family Medicine | Admitting: Family Medicine

## 2011-09-09 ENCOUNTER — Encounter (HOSPITAL_COMMUNITY): Payer: Self-pay | Admitting: *Deleted

## 2011-09-09 DIAGNOSIS — N952 Postmenopausal atrophic vaginitis: Secondary | ICD-10-CM

## 2011-09-09 LAB — WET PREP, GENITAL

## 2011-09-09 LAB — POCT URINALYSIS DIP (DEVICE)
Bilirubin Urine: NEGATIVE
Hgb urine dipstick: NEGATIVE
Leukocytes, UA: NEGATIVE
Nitrite: NEGATIVE
Protein, ur: 100 mg/dL — AB
Urobilinogen, UA: 0.2 mg/dL (ref 0.0–1.0)
pH: 6 (ref 5.0–8.0)

## 2011-09-09 MED ORDER — FLUCONAZOLE 150 MG PO TABS: 150.0000 mg | ORAL_TABLET | Freq: Once | ORAL | Status: AC

## 2011-09-09 MED ORDER — METRONIDAZOLE 0.75 % VA GEL: 1.0000 | VAGINAL | Status: AC

## 2011-09-09 NOTE — ED Provider Notes (Addendum)
History     CSN: [id number]  Arrival date & time 09/09/11  1220   First MD Initiated Contact with Patient 09/09/11 1423      Chief Complaint  Patient presents with  . Vaginal Discharge    (Consider location/radiation/quality/duration/timing/severity/associated sxs/prior treatment) Patient is a 51 y.o. female presenting with vaginal discharge. The history is provided by the patient.  Vaginal Discharge This is a new problem. The current episode started more than 2 days ago (had hidradenitis surg and abx by dr blackmon recently,current sx since, also with menopausal change.). The problem has been gradually worsening. Pertinent negatives include no abdominal pain.    Past Medical History  Diagnosis Date  . GERD (gastroesophageal reflux disease)   . Anemia   . Anxiety   . Sickle cell trait   . Hidradenitis     Past Surgical History  Procedure Date  . Cholecystectomy   . Axillary hidradenitis excision     right - 04/22/2006; left - 01/02/2009  . Perineal hidradenitis excision 01/23/2011    right buttock  . Hysteroscopy w/d&c 07/15/2008  . Hysteroscopy w/ endometrial ablation 11/23/2009    and D & C  . Hydradenitis excision 07/31/2011    Procedure: EXCISION HYDRADENITIS GROIN;  Surgeon: Harl Bowie, MD;  Location: [place];  Service: General;  Laterality: Right;    Family History  Problem Relation Age of Onset  . Hypertension Brother   . Cancer Son     leukemia    History  Substance Use Topics  . Smoking status: Never Smoker   . Smokeless tobacco: Never Used  . Alcohol Use: No    OB History    Grav Para Term Preterm Abortions TAB SAB Ect Mult Living                  Review of Systems  Constitutional: Negative.   Gastrointestinal: Negative.  Negative for abdominal pain.  Genitourinary: Positive for frequency and vaginal discharge.    Allergies  Adhesive  Home Medications   Current Outpatient Rx  Name Route Sig Dispense Refill  .  ALPRAZOLAM 0.5 MG PO TABS Oral Take 0.5 mg by mouth as needed.     Marland Kitchen DIPHENHYDRAMINE HCL (SLEEP) 25 MG PO TABS Oral Take 25 mg by mouth at bedtime as needed.    Marland Kitchen FERROUS SULFATE 325 (65 FE) MG PO TABS Oral Take 325 mg by mouth daily with breakfast.      . DIFLUCAN PO Oral Take by mouth daily.     . IBUPROFEN 800 MG PO TABS Oral Take 800 mg by mouth every 8 (eight) hours as needed.      Marland Kitchen PANTOPRAZOLE SODIUM 40 MG PO TBEC Oral Take 40 mg by mouth daily. AM    . TRAMADOL HCL 50 MG PO TABS Oral Take 50 mg by mouth as needed.       BP 138/82  Pulse 91  Temp(Src) 98.8 F (37.1 C) (Oral)  Resp 12  SpO2 100%  Physical Exam  Nursing note and vitals reviewed. Constitutional: She is oriented to person, place, and time. She appears well-developed and well-nourished.  HENT:  Head: Normocephalic.  Abdominal: Soft. Bowel sounds are normal. She exhibits no distension and no mass. There is no tenderness. There is no rebound and no guarding.       Healing right groin scar.  Genitourinary: Vagina normal and uterus normal. No vaginal discharge found.  Neurological: She is alert and oriented to person, place, and  time.  Skin: Skin is warm and dry.    ED Course  Procedures (including critical care time)  Labs Reviewed  POCT URINALYSIS DIP (DEVICE) - Abnormal; Notable for the following:    Protein, ur 100 (*)    All other components within normal limits  WET PREP, GENITAL - Abnormal; Notable for the following:    Clue Cells Wet Prep HPF POC FEW (*)    WBC, Wet Prep HPF POC FEW (*)    All other components within normal limits  POCT PREGNANCY, URINE  GC/CHLAMYDIA PROBE AMP, GENITAL  LAB REPORT - SCANNED   No results found.   1. Vaginitis, atrophic       MDM          Billy Fischer, MD 09/09/11 [place], MD 10/05/11 [phone]

## 2011-09-09 NOTE — ED Notes (Signed)
Pt  Reports    Symptoms  Of  Urinary  Frequency  And  Discomfort  She  Reports  Also  Has  Vaginal  Discharge  And  Irritation

## 2011-09-09 NOTE — Discharge Instructions (Signed)
Use medicine as prescribed, we will call if tests show a need for other treatment.

## 2011-09-10 LAB — GC/CHLAMYDIA PROBE AMP, GENITAL: Chlamydia, DNA Probe: NEGATIVE

## 2011-09-13 ENCOUNTER — Encounter (INDEPENDENT_AMBULATORY_CARE_PROVIDER_SITE_OTHER): Payer: Self-pay | Admitting: Surgery

## 2011-09-13 ENCOUNTER — Ambulatory Visit (INDEPENDENT_AMBULATORY_CARE_PROVIDER_SITE_OTHER): Payer: PRIVATE HEALTH INSURANCE | Admitting: Surgery

## 2011-09-13 VITALS — BP 130/84 | HR 74 | Temp 98.6°F | Resp 16 | Ht 63.0 in | Wt 264.8 lb

## 2011-09-13 DIAGNOSIS — Z09 Encounter for follow-up examination after completed treatment for conditions other than malignant neoplasm: Secondary | ICD-10-CM

## 2011-09-13 NOTE — Progress Notes (Signed)
Subjective:     Patient ID: Elizabeth Griffin, female   DOB: [birth year], 51 y.o.   MRN: [id number]  HPI She is here for another postoperative visit. She has normal discomfort in her drainage is decreasing  Review of Systems     Objective:   Physical Exam    On exam, her incisions are continuing to heal. I treated the wound with silver nitrate. Assessment:     Patient status post wide excision of hidradenitis    Plan:     I will see her back in one

## 2011-10-11 ENCOUNTER — Ambulatory Visit (INDEPENDENT_AMBULATORY_CARE_PROVIDER_SITE_OTHER): Payer: PRIVATE HEALTH INSURANCE | Admitting: Surgery

## 2011-10-11 ENCOUNTER — Encounter (INDEPENDENT_AMBULATORY_CARE_PROVIDER_SITE_OTHER): Payer: Self-pay | Admitting: Surgery

## 2011-10-11 VITALS — BP 152/98 | HR 76 | Temp 97.4°F | Resp 14 | Ht 62.0 in | Wt 266.2 lb

## 2011-10-11 DIAGNOSIS — T8189XA Other complications of procedures, not elsewhere classified, initial encounter: Secondary | ICD-10-CM

## 2011-10-11 NOTE — Progress Notes (Signed)
Subjective:     Patient ID: Elizabeth Griffin, female   DOB: 12-23-1960, 51 y.o.   MRN: [id number]  HPI She is here for followup of her right groin wound status post excision of hidradenitis. She reports that she has had no flareups. She still has an open wound draining some fluid  Review of Systems     Objective:   Physical Exam    Most of the incision has completely healed. There is still a small area with open granulation tissue which I again treated with silver nitrate Assessment:     Nonhealing surgical wound status post excision of hidradenitis    Plan:     She will continue her current wound care. I will see her back in one month

## 2011-11-12 ENCOUNTER — Encounter (INDEPENDENT_AMBULATORY_CARE_PROVIDER_SITE_OTHER): Payer: PRIVATE HEALTH INSURANCE | Admitting: Surgery

## 2011-11-15 ENCOUNTER — Ambulatory Visit (INDEPENDENT_AMBULATORY_CARE_PROVIDER_SITE_OTHER): Payer: PRIVATE HEALTH INSURANCE | Admitting: Surgery

## 2011-11-15 ENCOUNTER — Encounter (INDEPENDENT_AMBULATORY_CARE_PROVIDER_SITE_OTHER): Payer: Self-pay | Admitting: Surgery

## 2011-11-15 VITALS — BP 136/86 | HR 76 | Temp 97.3°F | Resp 14 | Ht 64.0 in | Wt 268.8 lb

## 2011-11-15 DIAGNOSIS — L732 Hidradenitis suppurativa: Secondary | ICD-10-CM

## 2011-11-15 NOTE — Progress Notes (Signed)
Subjective:     Patient ID: Elizabeth Griffin, female   DOB: [birth year], 51 y.o.   MRN: [id number]  HPI She is here for followup for hidradenitis. She reports her groins are doing mildly well but she has flare up of both her axilla. She has been off antibiotics for some time  Review of Systems     Objective:   Physical Exam    She does have flare up mildly of hidradenitis in both her axilla and in her left groin. Her right groins healing well Assessment:     Hidradenitis    Plan:     Under resume her antibiotics. I will see her back in a month unless  she acutely worsens

## 2011-12-04 ENCOUNTER — Other Ambulatory Visit: Payer: Self-pay | Admitting: Family Medicine

## 2011-12-04 DIAGNOSIS — Z1231 Encounter for screening mammogram for malignant neoplasm of breast: Secondary | ICD-10-CM

## 2011-12-10 ENCOUNTER — Ambulatory Visit (INDEPENDENT_AMBULATORY_CARE_PROVIDER_SITE_OTHER): Payer: PRIVATE HEALTH INSURANCE | Admitting: Surgery

## 2011-12-10 VITALS — BP 128/88 | HR 84 | Temp 97.1°F | Resp 18 | Ht 63.0 in | Wt 266.0 lb

## 2011-12-10 DIAGNOSIS — L732 Hidradenitis suppurativa: Secondary | ICD-10-CM

## 2011-12-10 NOTE — Progress Notes (Signed)
Subjective:     Patient ID: Elizabeth Griffin, female   DOB: Jul 28, 1960, 51 y.o.   MRN: [id number]  HPI She is here for a followup of her hidradenitis. She reports her groins are doing well but her right axilla has flared slightly.  Review of Systems     Objective:   Physical Exam On exam, the groins are well healed. There is only a small open area in the right axilla    Assessment:     Chronic hidradenitis    Plan:     I am going to try her on Cipro for the axilla. I wrote her for Diflucan as she is very prone to yeast infections. I will see her back in one month

## 2012-01-07 ENCOUNTER — Ambulatory Visit
Admission: RE | Admit: 2012-01-07 | Discharge: 2012-01-07 | Disposition: A | Discharge status: Home / Self Care | Payer: Self-pay | Source: Ambulatory Visit | Attending: Family Medicine | Admitting: Family Medicine

## 2012-01-07 ENCOUNTER — Encounter (INDEPENDENT_AMBULATORY_CARE_PROVIDER_SITE_OTHER): Payer: PRIVATE HEALTH INSURANCE | Admitting: Surgery

## 2012-01-07 DIAGNOSIS — Z1231 Encounter for screening mammogram for malignant neoplasm of breast: Secondary | ICD-10-CM

## 2012-01-09 ENCOUNTER — Other Ambulatory Visit: Payer: Self-pay | Admitting: Family Medicine

## 2012-01-09 DIAGNOSIS — R928 Other abnormal and inconclusive findings on diagnostic imaging of breast: Secondary | ICD-10-CM

## 2012-01-15 ENCOUNTER — Encounter (INDEPENDENT_AMBULATORY_CARE_PROVIDER_SITE_OTHER): Payer: Self-pay | Admitting: Surgery

## 2012-01-15 ENCOUNTER — Ambulatory Visit (INDEPENDENT_AMBULATORY_CARE_PROVIDER_SITE_OTHER): Payer: PRIVATE HEALTH INSURANCE | Admitting: Surgery

## 2012-01-15 VITALS — BP 104/74 | HR 95 | Temp 96.4°F | Ht 62.0 in | Wt 264.6 lb

## 2012-01-15 DIAGNOSIS — L732 Hidradenitis suppurativa: Secondary | ICD-10-CM

## 2012-01-15 NOTE — Progress Notes (Signed)
Subjective:     Patient ID: Elizabeth Griffin, female   DOB: 1961/01/15, 51 y.o.   MRN: [id number]  HPI She is here for another visit regarding her hidradenitis. She reports the Cipro didn't help significantly and she has now flared up when the antibiotics stopped. She has flared up in the right axilla and on both breasts.  Review of Systems     Objective:   Physical Exam On exam, there were tiny areas of both breasts as well as a small area in the right axilla with chronic hidradenitis and mild erythema. There is no fluctuance   Assessment:     Chronic hidradenitis    Plan:     I'm going to continue her on Cipro as this seems to be working well for her. I also wrote for Diflucan as she is prone to yeast infections. I will see her back in one month

## 2012-01-17 ENCOUNTER — Ambulatory Visit
Admission: RE | Admit: 2012-01-17 | Discharge: 2012-01-17 | Disposition: A | Discharge status: Home / Self Care | Payer: Self-pay | Source: Ambulatory Visit | Attending: Family Medicine | Admitting: Family Medicine

## 2012-01-17 DIAGNOSIS — R928 Other abnormal and inconclusive findings on diagnostic imaging of breast: Secondary | ICD-10-CM

## 2012-02-12 ENCOUNTER — Telehealth (INDEPENDENT_AMBULATORY_CARE_PROVIDER_SITE_OTHER): Payer: Self-pay | Admitting: General Surgery

## 2012-02-12 NOTE — Telephone Encounter (Signed)
Called in Diflucan 150 mg x 1 dose with 3 refill call into [facility] [phone]

## 2012-02-12 NOTE — Telephone Encounter (Signed)
Called patient to advised prescription for Doxycycline 100 mg issue with 3 week supply. Advised her to give the pharmacy at least 45 minutes before calling to make sure it has been filled before she goes there. Patient stated that she also needs Diflucan issued and stated Dr. Ninfa Linden knows that she would need this medication issued. States it is due to reaction from antibiotics. Advised information would be forwarded to Dr. Ninfa Linden.  Doxycycline was called in the [facility] on [address] [phone].

## 2012-02-12 NOTE — Telephone Encounter (Signed)
Patient called in stating she has another area of hidradenitis on her upper thigh that she was taking antibiotics for. Patient stated she still had some left from the surgery (which she should have completed), said Doxycycline worked, but she was given Cipro and it is not working as well. Patient would like another antibiotic issued since she is out of them at this time. Patient confirmed appointment with Dr. Ninfa Linden next week. Advised information would be sent to Dr. Ninfa Linden and Deneise Lever since he is in the office this week.

## 2012-02-12 NOTE — Telephone Encounter (Signed)
BLACKMAN,DOUGLAS A, MD 02/12/2012 11:18 AM Signed  Call in Doxycycline 100 mg po BID for 3 week supply with 2 refills.  Thank you  Called in prescription request approved about to [facility] on [address] # [phone].

## 2012-02-12 NOTE — Telephone Encounter (Signed)
Call in Doxycycline 100 mg po BID for 3 week supply with 2 refills. Thank you

## 2012-02-17 ENCOUNTER — Encounter (INDEPENDENT_AMBULATORY_CARE_PROVIDER_SITE_OTHER): Payer: Self-pay | Admitting: Surgery

## 2012-02-17 ENCOUNTER — Ambulatory Visit (INDEPENDENT_AMBULATORY_CARE_PROVIDER_SITE_OTHER): Payer: PRIVATE HEALTH INSURANCE | Admitting: Surgery

## 2012-02-17 VITALS — BP 118/80 | HR 82 | Temp 97.6°F | Resp 18 | Ht 62.0 in | Wt 267.2 lb

## 2012-02-17 DIAGNOSIS — L732 Hidradenitis suppurativa: Secondary | ICD-10-CM

## 2012-02-17 NOTE — Progress Notes (Signed)
Subjective:     Patient ID: Elizabeth Griffin, female   DOB: September 24, 1960, 51 y.o.   MRN: [id number]  HPI She had an area of hidradenitis flareup of her left medial thigh which is now spontaneously drained. She did have significant pain until then.  Review of Systems     Objective:   Physical Exam    on exam, there is an abscess site on the left inner thigh which is now completely drained. Assessment:     hidradenitis    Plan:     I'm going to  resume the Cipro.  I also prescribed Diflucan as she is prone to yeast infections. I will also resume her tramadol and see her back in 3 weeks

## 2012-03-07 ENCOUNTER — Inpatient Hospital Stay (HOSPITAL_COMMUNITY)
Admission: EM | Admit: 2012-03-07 | Discharge: 2012-03-10 | DRG: 640 | Disposition: A | Discharge status: Home / Self Care | Payer: Medicaid Other | Attending: Internal Medicine | Admitting: Internal Medicine

## 2012-03-07 DIAGNOSIS — D573 Sickle-cell trait: Secondary | ICD-10-CM | POA: Diagnosis present

## 2012-03-07 DIAGNOSIS — I959 Hypotension, unspecified: Secondary | ICD-10-CM

## 2012-03-07 DIAGNOSIS — I10 Essential (primary) hypertension: Secondary | ICD-10-CM | POA: Diagnosis present

## 2012-03-07 DIAGNOSIS — N179 Acute kidney failure, unspecified: Secondary | ICD-10-CM

## 2012-03-07 DIAGNOSIS — Y92009 Unspecified place in unspecified non-institutional (private) residence as the place of occurrence of the external cause: Secondary | ICD-10-CM

## 2012-03-07 DIAGNOSIS — F411 Generalized anxiety disorder: Secondary | ICD-10-CM

## 2012-03-07 DIAGNOSIS — T46905A Adverse effect of unspecified agents primarily affecting the cardiovascular system, initial encounter: Secondary | ICD-10-CM | POA: Diagnosis present

## 2012-03-07 DIAGNOSIS — N170 Acute kidney failure with tubular necrosis: Secondary | ICD-10-CM | POA: Diagnosis present

## 2012-03-07 DIAGNOSIS — D509 Iron deficiency anemia, unspecified: Secondary | ICD-10-CM | POA: Diagnosis present

## 2012-03-07 DIAGNOSIS — R197 Diarrhea, unspecified: Secondary | ICD-10-CM

## 2012-03-07 DIAGNOSIS — I951 Orthostatic hypotension: Secondary | ICD-10-CM | POA: Diagnosis present

## 2012-03-07 DIAGNOSIS — Z6841 Body Mass Index (BMI) 40.0 and over, adult: Secondary | ICD-10-CM

## 2012-03-07 DIAGNOSIS — E860 Dehydration: Principal | ICD-10-CM | POA: Diagnosis present

## 2012-03-07 DIAGNOSIS — E669 Obesity, unspecified: Secondary | ICD-10-CM | POA: Diagnosis present

## 2012-03-07 DIAGNOSIS — Z9089 Acquired absence of other organs: Secondary | ICD-10-CM

## 2012-03-07 DIAGNOSIS — N289 Disorder of kidney and ureter, unspecified: Secondary | ICD-10-CM

## 2012-03-07 DIAGNOSIS — K219 Gastro-esophageal reflux disease without esophagitis: Secondary | ICD-10-CM

## 2012-03-07 DIAGNOSIS — E876 Hypokalemia: Secondary | ICD-10-CM | POA: Diagnosis present

## 2012-03-07 DIAGNOSIS — R55 Syncope and collapse: Secondary | ICD-10-CM

## 2012-03-07 DIAGNOSIS — Z8249 Family history of ischemic heart disease and other diseases of the circulatory system: Secondary | ICD-10-CM

## 2012-03-07 HISTORY — DX: Hidradenitis suppurativa: L73.2

## 2012-03-07 MED ORDER — SODIUM CHLORIDE 0.9 % IV BOLUS (SEPSIS)
1000.0000 mL | Freq: Once | INTRAVENOUS | Status: AC
  Administered 2012-03-07: 1000 mL via INTRAVENOUS

## 2012-03-07 NOTE — ED Notes (Signed)
Pt states  PCP started her on new BP medication.  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 She remembers feeling hot and passed out. She was brought in by Elder and son. She is alert and oriented x 4.  Not clammy or diaphoretic, and  denies chest pain.   Pt states she fell at [employer] on Thursday and hurt hip.

## 2012-03-08 ENCOUNTER — Emergency Department (HOSPITAL_COMMUNITY): Payer: Medicaid Other

## 2012-03-08 ENCOUNTER — Encounter (HOSPITAL_COMMUNITY): Payer: Self-pay | Admitting: *Deleted

## 2012-03-08 DIAGNOSIS — N179 Acute kidney failure, unspecified: Secondary | ICD-10-CM

## 2012-03-08 DIAGNOSIS — E860 Dehydration: Principal | ICD-10-CM

## 2012-03-08 DIAGNOSIS — K219 Gastro-esophageal reflux disease without esophagitis: Secondary | ICD-10-CM

## 2012-03-08 DIAGNOSIS — R55 Syncope and collapse: Secondary | ICD-10-CM

## 2012-03-08 DIAGNOSIS — I959 Hypotension, unspecified: Secondary | ICD-10-CM

## 2012-03-08 DIAGNOSIS — R197 Diarrhea, unspecified: Secondary | ICD-10-CM

## 2012-03-08 LAB — RETICULOCYTES
Retic Count, Absolute: 61.7 10*3/uL (ref 19.0–186.0)
Retic Ct Pct: 1.8 % (ref 0.4–3.1)

## 2012-03-08 LAB — COMPREHENSIVE METABOLIC PANEL
ALT: 13 U/L (ref 0–35)
AST: 20 U/L (ref 0–37)
Albumin: 2.7 g/dL — ABNORMAL LOW (ref 3.5–5.2)
Alkaline Phosphatase: 60 U/L (ref 39–117)
Calcium: 7.9 mg/dL — ABNORMAL LOW (ref 8.4–10.5)
Potassium: 3 mEq/L — ABNORMAL LOW (ref 3.5–5.1)
Sodium: 136 mEq/L (ref 135–145)
Total Protein: 8.1 g/dL (ref 6.0–8.3)

## 2012-03-08 LAB — URINALYSIS, ROUTINE W REFLEX MICROSCOPIC
Bilirubin Urine: NEGATIVE
Glucose, UA: NEGATIVE mg/dL
Hgb urine dipstick: NEGATIVE
Nitrite: NEGATIVE
Specific Gravity, Urine: 1.017 (ref 1.005–1.030)
pH: 5.5 (ref 5.0–8.0)

## 2012-03-08 LAB — CBC WITH DIFFERENTIAL/PLATELET
Basophils Absolute: 0 10*3/uL (ref 0.0–0.1)
Eosinophils Absolute: 0 10*3/uL (ref 0.0–0.7)
Eosinophils Relative: 0 % (ref 0–5)
Lymphocytes Relative: 19 % (ref 12–46)
Lymphs Abs: 2.3 10*3/uL (ref 0.7–4.0)
MCH: 25.1 pg — ABNORMAL LOW (ref 26.0–34.0)
MCV: 75.9 fL — ABNORMAL LOW (ref 78.0–100.0)
Neutrophils Relative %: 78 % — ABNORMAL HIGH (ref 43–77)
Platelets: 589 10*3/uL — ABNORMAL HIGH (ref 150–400)
RBC: 3.86 MIL/uL — ABNORMAL LOW (ref 3.87–5.11)
RDW: 17.7 % — ABNORMAL HIGH (ref 11.5–15.5)
WBC: 11.8 10*3/uL — ABNORMAL HIGH (ref 4.0–10.5)

## 2012-03-08 LAB — TROPONIN I: Troponin I: <0.3 ng/mL (ref <0.30)

## 2012-03-08 LAB — FERRITIN: Ferritin: 29 ng/mL (ref 10–291)

## 2012-03-08 LAB — URINE MICROSCOPIC-ADD ON

## 2012-03-08 LAB — IRON AND TIBC: Iron: 33 ug/dL — ABNORMAL LOW (ref 42–135)

## 2012-03-08 MED ORDER — POTASSIUM CHLORIDE CRYS ER 20 MEQ PO TBCR
40.0000 meq | EXTENDED_RELEASE_TABLET | ORAL | Status: AC
  Administered 2012-03-08: 40 meq via ORAL
  Filled 2012-03-08: qty 2

## 2012-03-08 MED ORDER — ALUM & MAG HYDROXIDE-SIMETH 200-200-20 MG/5ML PO SUSP: 30.0000 mL | Freq: Four times a day (QID) | ORAL | Status: DC | PRN

## 2012-03-08 MED ORDER — HEPARIN SODIUM (PORCINE) 5000 UNIT/ML IJ SOLN
5000.0000 [IU] | Freq: Three times a day (TID) | INTRAMUSCULAR | Status: DC
  Administered 2012-03-08 – 2012-03-10 (×7): 5000 [IU] via SUBCUTANEOUS
  Filled 2012-03-08 (×10): qty 1

## 2012-03-08 MED ORDER — LOPERAMIDE HCL 2 MG PO CAPS
2.0000 mg | ORAL_CAPSULE | ORAL | Status: DC | PRN
  Administered 2012-03-08: 2 mg via ORAL
  Filled 2012-03-08: qty 1

## 2012-03-08 MED ORDER — POTASSIUM CHLORIDE 20 MEQ/15ML (10%) PO LIQD
40.0000 meq | Freq: Once | ORAL | Status: AC
  Administered 2012-03-08: 40 meq via ORAL
  Filled 2012-03-08: qty 30

## 2012-03-08 MED ORDER — ONDANSETRON HCL 4 MG/2ML IJ SOLN: 4.0000 mg | Freq: Four times a day (QID) | INTRAMUSCULAR | Status: DC | PRN

## 2012-03-08 MED ORDER — DIPHENHYDRAMINE HCL 50 MG/ML IJ SOLN
25.0000 mg | Freq: Once | INTRAMUSCULAR | Status: AC
Start: 2012-03-08 — End: 2012-03-08
  Administered 2012-03-08: 25 mg via INTRAVENOUS
  Filled 2012-03-08: qty 1

## 2012-03-08 MED ORDER — PNEUMOCOCCAL VAC POLYVALENT 25 MCG/0.5ML IJ INJ
0.5000 mL | INJECTION | INTRAMUSCULAR | Status: AC
  Administered 2012-03-08: 0.5 mL via INTRAMUSCULAR
  Filled 2012-03-08: qty 0.5

## 2012-03-08 MED ORDER — PANTOPRAZOLE SODIUM 40 MG PO TBEC
40.0000 mg | DELAYED_RELEASE_TABLET | Freq: Every day | ORAL | Status: DC
  Administered 2012-03-08 – 2012-03-10 (×3): 40 mg via ORAL
  Filled 2012-03-08 (×4): qty 1

## 2012-03-08 MED ORDER — ONDANSETRON HCL 4 MG/2ML IJ SOLN
4.0000 mg | Freq: Once | INTRAMUSCULAR | Status: AC
  Administered 2012-03-08: 4 mg via INTRAVENOUS
  Filled 2012-03-08: qty 2

## 2012-03-08 MED ORDER — ONDANSETRON HCL 4 MG PO TABS: 4.0000 mg | ORAL_TABLET | Freq: Four times a day (QID) | ORAL | Status: DC | PRN

## 2012-03-08 MED ORDER — POTASSIUM CHLORIDE CRYS ER 20 MEQ PO TBCR
40.0000 meq | EXTENDED_RELEASE_TABLET | Freq: Once | ORAL | Status: AC
  Administered 2012-03-08: 40 meq via ORAL
  Filled 2012-03-08: qty 2

## 2012-03-08 MED ORDER — SODIUM CHLORIDE 0.9 % IV SOLN: INTRAVENOUS | Status: AC

## 2012-03-08 MED ORDER — ACETAMINOPHEN 650 MG RE SUPP: 650.0000 mg | Freq: Four times a day (QID) | RECTAL | Status: DC | PRN

## 2012-03-08 MED ORDER — INFLUENZA VIRUS VACC SPLIT PF IM SUSP
0.5000 mL | INTRAMUSCULAR | Status: AC
  Administered 2012-03-08: 0.5 mL via INTRAMUSCULAR
  Filled 2012-03-08: qty 0.5

## 2012-03-08 MED ORDER — SODIUM CHLORIDE 0.9 % IV SOLN
INTRAVENOUS | Status: DC
  Administered 2012-03-08: 100 mL/h via INTRAVENOUS

## 2012-03-08 MED ORDER — SODIUM CHLORIDE 0.9 % IV BOLUS (SEPSIS)
1000.0000 mL | Freq: Once | INTRAVENOUS | Status: AC
  Administered 2012-03-08: 1000 mL via INTRAVENOUS

## 2012-03-08 MED ORDER — POTASSIUM CHLORIDE 10 MEQ/100ML IV SOLN
10.0000 meq | Freq: Once | INTRAVENOUS | Status: DC
  Administered 2012-03-08: 10 meq via INTRAVENOUS
  Filled 2012-03-08: qty 100

## 2012-03-08 MED ORDER — ACETAMINOPHEN 325 MG PO TABS
650.0000 mg | ORAL_TABLET | Freq: Four times a day (QID) | ORAL | Status: DC | PRN
  Administered 2012-03-09: 650 mg via ORAL
  Filled 2012-03-08: qty 2

## 2012-03-08 NOTE — ED Provider Notes (Signed)
History     CSN: [id number]  Arrival date & time 03/07/12  2104   First MD Initiated Contact with Patient 03/07/12 2303      Chief Complaint  Patient presents with  . Hypotension  . Loss of Consciousness    (Consider location/radiation/quality/duration/timing/severity/associated sxs/prior treatment) HPI Pt recently finished abx and has had diarrhea for several days. No blood or mucous in stool. Mild episodc abd cramping. Pt recently started on Lisinopril by PMD. 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 No head or neck trauma. Pt states she is feeling much better at this point. No CP, SOB, fever or chills.  Past Medical History  Diagnosis Date  . GERD (gastroesophageal reflux disease)   . Anemia   . Anxiety   . Sickle cell trait   . Hidradenitis     Past Surgical History  Procedure Date  . Cholecystectomy   . Axillary hidradenitis excision     right - 04/22/2006; left - 01/02/2009  . Perineal hidradenitis excision 01/23/2011    right buttock  . Hysteroscopy w/d&c 07/15/2008  . Hysteroscopy w/ endometrial ablation 11/23/2009    and D & C  . Hydradenitis excision 07/31/2011    Procedure: EXCISION HYDRADENITIS GROIN;  Surgeon: Harl Bowie, MD;  Location: [facility];  Service: General;  Laterality: Right;    Family History  Problem Relation Age of Onset  . Hypertension Brother   . Cancer Son     leukemia    History  Substance Use Topics  . Smoking status: Never Smoker   . Smokeless tobacco: Never Used  . Alcohol Use: No    OB History    Grav Para Term Preterm Abortions TAB SAB Ect Mult Living                  Review of Systems  Constitutional: Positive for diaphoresis. Negative for fever and chills.  HENT: Negative for neck pain.   Eyes: Positive for visual disturbance.  Respiratory: Negative for shortness of breath.   Cardiovascular: Negative for chest pain.    Gastrointestinal: Positive for nausea and vomiting. Negative for diarrhea.  Genitourinary: Negative for dysuria and flank pain.  Musculoskeletal: Negative for back pain.  Skin: Negative for rash and wound.  Neurological: Positive for dizziness, syncope and light-headedness. Negative for weakness, numbness and headaches.    Allergies  Adhesive and Lisinopril  Home Medications   Current Outpatient Rx  Name Route Sig Dispense Refill  . ALPRAZOLAM 0.5 MG PO TABS Oral Take 0.5 mg by mouth 3 (three) times daily as needed. For anxiety    . DIPHENHYDRAMINE HCL 25 MG PO TABS Oral Take 50 mg by mouth at bedtime.    Marland Kitchen FLUCONAZOLE 150 MG PO TABS Oral Take 150 mg by mouth.    Marland Kitchen HYDROCHLOROTHIAZIDE 25 MG PO TABS Oral Take 25 mg by mouth daily.    . IBUPROFEN 800 MG PO TABS Oral Take 800 mg by mouth every 8 (eight) hours as needed.      Marland Kitchen LISINOPRIL 10 MG PO TABS Oral Take 10 mg by mouth 2 (two) times daily.    . ADULT MULTIVITAMIN W/MINERALS CH Oral Take 1 tablet by mouth daily.    Marland Kitchen PANTOPRAZOLE SODIUM 40 MG PO TBEC Oral Take 40 mg by mouth daily. AM    . TRAMADOL HCL 50 MG PO TABS Oral Take 50 mg by mouth every 6 (six) hours as needed.  For pain    . CIPROFLOXACIN HCL 500 MG PO TABS Oral Take 500 mg by mouth 2 (two) times daily.    Marland Kitchen METRONIDAZOLE 500 MG PO TABS Oral Take 500 mg by mouth 2 (two) times daily.      BP 108/52  Pulse 96  Resp 24  SpO2 96%  Physical Exam  Nursing note and vitals reviewed. Constitutional: She is oriented to person, place, and time. She appears well-developed and well-nourished. No distress.  HENT:  Head: Normocephalic and atraumatic.  Mouth/Throat: Oropharynx is clear and moist.  Eyes: EOM are normal. Pupils are equal, round, and reactive to light.  Neck: Normal range of motion. Neck supple.       No posterior cervical TTP  Cardiovascular: Normal rate and regular rhythm.   Pulmonary/Chest: Effort normal and breath sounds normal. No respiratory distress. She  has no wheezes. She has no rales.  Abdominal: Soft. Bowel sounds are normal. There is no tenderness. There is no rebound and no guarding.  Musculoskeletal: Normal range of motion. She exhibits tenderness (mild TTP over R hip on lateral surface. FROM at the joint). She exhibits no edema.  Neurological: She is alert and oriented to person, place, and time.       5/5 motor in all ext. Sensation intact  Skin: Skin is warm and dry. No rash noted. No erythema.  Psychiatric: She has a normal mood and affect. Her behavior is normal.    ED Course  Procedures (including critical care time)  Labs Reviewed  CBC WITH DIFFERENTIAL - Abnormal; Notable for the following:    WBC 11.8 (*)     RBC 3.86 (*)     Hemoglobin 9.7 (*)     HCT 29.3 (*)     MCV 75.9 (*)     MCH 25.1 (*)     RDW 17.7 (*)     Platelets 589 (*)     Neutrophils Relative 78 (*)     Neutro Abs 9.2 (*)     Monocytes Relative 2 (*)     All other components within normal limits  COMPREHENSIVE METABOLIC PANEL - Abnormal; Notable for the following:    Potassium 3.0 (*)     Glucose, Bld 125 (*)     Creatinine, Ser 2.36 (*)     Calcium 7.9 (*)     Albumin 2.7 (*)     Total Bilirubin 0.1 (*)     GFR calc non Af Amer 23 (*)     GFR calc Af Amer 26 (*)     All other components within normal limits  TROPONIN I  URINALYSIS, ROUTINE W REFLEX MICROSCOPIC  CLOSTRIDIUM DIFFICILE BY PCR   Dg Hip Complete Right  03/08/2012  *RADIOLOGY REPORT*  Clinical Data: Hypotension, loss of consciousness  RIGHT HIP - COMPLETE 2+ VIEW  Comparison: None.  Findings: Hips are located.  No evidence of pelvic fracture sacral fracture.  Dedicated views right hip demonstrates no evidence of femoral neck fracture.  IMPRESSION: No evidence of pelvic fracture or right hip fracture.   Original Report Authenticated By: Suzy Bouchard, M.D.      1. Hypotension   2. Renal insufficiency       MDM  Discussed with [facility]. Will admit the pt.        Julianne Rice, MD 03/08/12 [phone]

## 2012-03-08 NOTE — Progress Notes (Signed)
51 year old female with pmh of HTN, GERD, hydradenitis and obesity; came tot he hospital secondary to syncope episode. Patient has been admitted after midnight by my colleague Dr. Claria Dice, please referred to her H&P for further details on admission. Patient currently w/o CP or SOB; no further episodes of syncope. Case appears to be related to orthostatic hypotension as cause for syncope.   She was also found with elevated Cr and acute renal failure; also with mild leukocytosis (possible demargination).  Will continue hydration, stop offending drugs and follow 2-D echo and Ce'z results (so far negative).  Jencarlo Bonadonna [phone]

## 2012-03-08 NOTE — ED Notes (Signed)
Pt. reminded for urinalysis,verbalized understanding .

## 2012-03-08 NOTE — H&P (Signed)
PCP:   Dr. 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 Patient has a history of hidradenitis, she  has a boil in her groin. For this reason she was started on Cipro which she's been on for 4 weeks. She was more recently started on Diflucan for 7 days, I'm unclear as to the reason. She's also been taking Diflucan intermittently as she develops yeast over the past 4 weeks. She was recently diagnosed with hypertension in June and started on hydrochlorothiazide. On a recent followup visit her creatinine was noted to be slightly elevated, lisinopril was added for renal protection per patient. She was prescribed 10 mg twice daily, she started  lisinopril on Thursday. Thursday evening she developed diarrhea which has been ongoing. She is also had intermittent nausea over the past 4 weeks. She fell on Friday, she believes due to a slip and fall. Today she syncopized.  In the ER the patient was hypotensive and orthostatic, her creatinine is elevated. When reclined patient's systolic blood pressure is in the low 100s, on standing her systolic blood pressures in the 80s. Patient also tachycardic. History provided by patient. Family members at bedside.   Review of Systems:  The patient denies anorexia, fever, weight loss,, vision loss, decreased hearing, hoarseness, chest pain, dyspnea on exertion, peripheral edema, balance deficits, hemoptysis, melena, hematochezia, severe indigestion/heartburn, hematuria, incontinence, genital sores, muscle weakness, suspicious skin lesions, transient blindness, difficulty walking, depression, unusual weight change, abnormal bleeding, enlarged lymph nodes, angioedema, and breast masses.  Past Medical History: Past Medical History  Diagnosis Date  . GERD (gastroesophageal reflux disease)   . Anemia   . Anxiety   . Sickle cell trait   . Hidradenitis    Past Surgical History    Procedure Date  . Cholecystectomy   . Axillary hidradenitis excision     right - 04/22/2006; left - 01/02/2009  . Perineal hidradenitis excision 01/23/2011    right buttock  . Hysteroscopy w/d&c 07/15/2008  . Hysteroscopy w/ endometrial ablation 11/23/2009    and D & C  . Hydradenitis excision 07/31/2011    Procedure: EXCISION HYDRADENITIS GROIN;  Surgeon: Harl Bowie, MD;  Location: [facility];  Service: General;  Laterality: Right;    Medications: Prior to Admission medications   Medication Sig Start Date End Date Taking? Authorizing Provider  ALPRAZolam Duanne Moron) 0.5 MG tablet Take 0.5 mg by mouth 3 (three) times daily as needed. For anxiety   Yes Historical Provider, MD  diphenhydrAMINE (BENADRYL) 25 MG tablet Take 50 mg by mouth at bedtime.   Yes Historical Provider, MD  fluconazole (DIFLUCAN) 150 MG tablet Take 150 mg by mouth.   Yes Historical Provider, MD  hydrochlorothiazide (HYDRODIURIL) 25 MG tablet Take 25 mg by mouth daily.   Yes Historical Provider, MD  ibuprofen (ADVIL,MOTRIN) 800 MG tablet Take 800 mg by mouth every 8 (eight) hours as needed.     Yes Historical Provider, MD  lisinopril (PRINIVIL,ZESTRIL) 10 MG tablet Take 10 mg by mouth 2 (two) times daily.   Yes Historical Provider, MD  Multiple Vitamin (MULTIVITAMIN WITH MINERALS) TABS Take 1 tablet by mouth daily.   Yes Historical Provider, MD  pantoprazole (PROTONIX) 40 MG tablet Take 40 mg by mouth daily. AM   Yes Historical Provider, MD  traMADol (ULTRAM) 50 MG tablet Take 50 mg by mouth every 6 (six) hours as needed. For pain  Yes Historical Provider, MD  ciprofloxacin (CIPRO) 500 MG tablet Take 500 mg by mouth 2 (two) times daily.    Historical Provider, MD  metroNIDAZOLE (FLAGYL) 500 MG tablet Take 500 mg by mouth 2 (two) times daily. 02/29/12   Historical Provider, MD    Allergies:   Allergies  Allergen Reactions  . Adhesive (Tape) Hives  . Lisinopril     dehydration    Social History:   reports that she has never smoked. She has never used smokeless tobacco. She reports that she does not drink alcohol or use illicit drugs.  Family History: Family History  Problem Relation Age of Onset  . Hypertension Brother   . Cancer Son     leukemia    Physical Exam: Filed Vitals:   03/07/12 2339 03/07/12 2341 03/07/12 2342 03/08/12 0252  BP: 92/61 91/58 84/61  108/52  Pulse: 96 97 101 96  Resp:    24  SpO2:    96%    General:  Alert and oriented times three, obese, no acute distress Eyes: PERRLA, pink conjunctiva, no scleral icterus ENT: Dry oral mucosa, neck supple, no thyromegaly Lungs: clear to ascultation, no wheeze, no crackles, no use of accessory muscles Cardiovascular: regular rate and rhythm, no regurgitation, no gallops, no murmurs. No carotid bruits, no JVD Abdomen: soft, positive BS, non-tender, non-distended, no organomegaly, not an acute abdomen GU: not examined Neuro: CN II - XII grossly intact, sensation intact Musculoskeletal: strength 5/5 all extremities, no clubbing, cyanosis or edema Skin: no rash, no subcutaneous crepitation, no decubitus Psych: appropriate patient   Labs on Admission:   [facility] 03/08/12 0145  NA 136  K 3.0*  CL 101  CO2 22  GLUCOSE 125*  BUN 21  CREATININE 2.36*  CALCIUM 7.9*  MG --  PHOS --    Basename 03/08/12 0145  AST 20  ALT 13  ALKPHOS 60  BILITOT 0.1*  PROT 8.1  ALBUMIN 2.7*   No results found for this basename: LIPASE:2,AMYLASE:2 in the last 72 hours  Basename 03/08/12 0145  WBC 11.8*  NEUTROABS 9.2*  HGB 9.7*  HCT 29.3*  MCV 75.9*  PLT 589*    Basename 03/08/12 0145  CKTOTAL --  CKMB --  CKMBINDEX --  TROPONINI <0.30   No components found with this basename: POCBNP:3 No results found for this basename: DDIMER:2 in the last 72 hours No results found for this basename: HGBA1C:2 in the last 72 hours No results found for this basename: CHOL:2,HDL:2,LDLCALC:2,TRIG:2,CHOLHDL:2,LDLDIRECT:2 in the  last 72 hours No results found for this basename: TSH,T4TOTAL,FREET3,T3FREE,THYROIDAB in the last 72 hours No results found for this basename: VITAMINB12:2,FOLATE:2,FERRITIN:2,TIBC:2,IRON:2,RETICCTPCT:2 in the last 72 hours  Micro Results: None   Radiological Exams on Admission: Dg Hip Complete Right  03/08/2012  *RADIOLOGY REPORT*  Clinical Data: Hypotension, loss of consciousness  RIGHT HIP - COMPLETE 2+ VIEW  Comparison: None.  Findings: Hips are located.  No evidence of pelvic fracture sacral fracture.  Dedicated views right hip demonstrates no evidence of femoral neck fracture.  IMPRESSION: No evidence of pelvic fracture or right hip fracture.   Original Report Authenticated By: Suzy Bouchard, M.D.     Assessment/Plan Present on Admission:  .Acute kidney injury/ATN Dehydration Diarrhea Syncope and collapse Orthostatic hypotension  admit to telemetry Patient symptoms are multifactorial, due to diarrhea and new blood pressure medication lisinopril. Will discontinue HCTZ and lisinopril. Continue IV fluid hydration Will order C. difficile toxins. Will discontinue patient's multiple antibiotics including Cipro, Flagyl and Diflucan Will be  ordered orthostatic vitals in the a.m. Repeat BMP in a.m. Syncope and collapse likely due to orthostatic hypotension Will order urinalysis Hypokalemia Likely due to diarrhea plus HCTZ. HCTZ discontinued  Replete potassium orally Morbid obesity .ANXIETY   Full code DVT prophylaxis   Casen Pryor 03/08/2012, 4:21 AM

## 2012-03-08 NOTE — ED Notes (Signed)
[id number] Expected date:<BR> Expected time:<BR> Means of arrival:<BR> Comments:<BR> Hold for RM 2.

## 2012-03-09 DIAGNOSIS — I951 Orthostatic hypotension: Secondary | ICD-10-CM

## 2012-03-09 LAB — BASIC METABOLIC PANEL
BUN: 18 mg/dL (ref 6–23)
CO2: 21 mEq/L (ref 19–32)
Chloride: 109 mEq/L (ref 96–112)
GFR calc Af Amer: 36 mL/min — ABNORMAL LOW (ref >90)
Glucose, Bld: 106 mg/dL — ABNORMAL HIGH (ref 70–99)
Potassium: 3.8 mEq/L (ref 3.5–5.1)

## 2012-03-09 LAB — CBC
HCT: 24.8 % — ABNORMAL LOW (ref 36.0–46.0)
Hemoglobin: 8.2 g/dL — ABNORMAL LOW (ref 12.0–15.0)
MCHC: 33.1 g/dL (ref 30.0–36.0)

## 2012-03-09 MED ORDER — POLYSACCHARIDE IRON COMPLEX 150 MG PO CAPS
150.0000 mg | ORAL_CAPSULE | Freq: Two times a day (BID) | ORAL | Status: DC
  Administered 2012-03-09 – 2012-03-10 (×2): 150 mg via ORAL
  Filled 2012-03-09 (×4): qty 1

## 2012-03-09 MED ORDER — DIPHENHYDRAMINE HCL 50 MG/ML IJ SOLN
50.0000 mg | Freq: Every evening | INTRAMUSCULAR | Status: DC | PRN
  Administered 2012-03-09: 50 mg via INTRAVENOUS
  Filled 2012-03-09: qty 1

## 2012-03-09 NOTE — Progress Notes (Signed)
  Echocardiogram 2D Echocardiogram has been performed.  Yamira Papa, [place] 03/09/2012, 1:08 PM

## 2012-03-09 NOTE — Progress Notes (Signed)
[facility] PROGRESS NOTE  Elizabeth Griffin [id number] DOB: [birth year] DOA: 03/07/2012 PCP: Dorna Mai, MD  Assessment/Plan: 1-Syncope: most likely secondary to dehydration and orthostasis. Symptoms improved/practically resolved with IVF's. No abnormalities on telemetry and CE'z negative. 2-D echo pending. BP well controlled without meds. Will repeat labs in am, follow 2-D echo and discharge home w/o antihypertensive drugs.  2-OBESITY: hypocaloric diet and exercise discussed with patient   3-ANXIETY:stable; currently no using any medications for anxiety  4-Acute kidney injury: pre-renal in nature due to volume depletion with diarrhea and diuretics; with some coadjuvant effect from ACE inhibitors. Improved with IVF's and after stopping nephrotoxic agents.  5-Dehydration: resolved with IVF's  6-Diarrhea: c. Diff negative. 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 Patient has a history of hidradenitis, she has a boil in her groin. For this reason she was started on Cipro which she's been on for 4 weeks. She was more recently started on Diflucan for 7 days, I'm unclear as to the reason. She's also been taking Diflucan intermittently as she develops yeast over the past 4 weeks. She was recently diagnosed with hypertension in June and started on hydrochlorothiazide. On a recent followup visit her creatinine was noted to be slightly elevated, lisinopril was added for renal protection per patient. She was prescribed 10 mg twice daily, she started lisinopril on Thursday. Thursday evening she developed diarrhea which has been ongoing. She is also had intermittent nausea over  the past 4 weeks. She fell on Friday, she believes due to a slip and fall. On the day of admission she syncopized  Antibiotics:  none  HPI/Subjective: Afebrile, no acute distress, no vomiting, no diarrhea; feeling a lot better.  Objective: Filed Vitals:   03/08/12 1500 03/08/12 2118 03/09/12 0701 03/09/12 1500  BP: 112/66 103/61 111/59 111/67  Pulse: 91 83 85 80  Temp: 98.2 F (36.8 C) 98.6 F (37 C) 97.9 F (36.6 C) 98.1 F (36.7 C)  TempSrc:  Oral Oral Oral  Resp: 24 20 20 19   Height:      Weight:      SpO2: 100% 100% 100% 100%    Intake/Output Summary (Last 24 hours) at 03/09/12 1633 Last data filed at 03/09/12 1300  Gross per 24 hour  Intake    580 ml  Output      0 ml  Net    580 ml   Filed Weights   03/07/12 2202 03/08/12 0600  Weight: 104.327 kg (230 lb) 122.154 kg (269 lb 4.8 oz)    Exam:   General:  Feeling a lot better, NO fever; NAD  Cardiovascular: RRR, no rubs or gallops  Respiratory: CTA bilaterally  Abdomen: obese, soft, NT, ND; positive BS  Extremities: trace edema bilaterally  Neuro:non focal  Data Reviewed: Basic Metabolic Panel:  Lab [id number] 03/08/12 0145  NA 139 136  K 3.8 3.0*  CL 109 101  CO2 21 22  GLUCOSE 106* 125*  BUN 18 21  CREATININE 1.83* 2.36*  CALCIUM 7.6* 7.9*  MG 1.7 --  PHOS -- --   Liver Function Tests:  Lab 03/08/12 0145  AST 20  ALT 13  ALKPHOS 60  BILITOT 0.1*  PROT 8.1  ALBUMIN 2.7*   CBC:  Lab 03/09/12 0416 03/15/2012 0145  WBC 7.0 11.8*  NEUTROABS -- 9.2*  HGB 8.2* 9.7*  HCT 24.8* 29.3*  MCV 76.5* 75.9*  PLT 467* 589*   Cardiac Enzymes:  Lab 2012-03-15 0145  CKTOTAL --  CKMB --  CKMBINDEX --  TROPONINI <0.30     Recent Results (from the past 240 hour(s))  CLOSTRIDIUM DIFFICILE BY PCR     Status: Normal   Collection Time   March 15, 2012  2:27 AM      Component Value Range Status Comment   C difficile by pcr NEGATIVE  NEGATIVE Final      Studies: Dg Hip Complete  Right  03-15-2012  *RADIOLOGY REPORT*  Clinical Data: Hypotension, loss of consciousness  RIGHT HIP - COMPLETE 2+ VIEW  Comparison: None.  Findings: Hips are located.  No evidence of pelvic fracture sacral fracture.  Dedicated views right hip demonstrates no evidence of femoral neck fracture.  IMPRESSION: No evidence of pelvic fracture or right hip fracture.   Original Report Authenticated By: Suzy Bouchard, M.D.     Scheduled Meds:   . diphenhydrAMINE  25 mg Intravenous Once  . heparin  5,000 Units Subcutaneous Q8H  . iron polysaccharides  150 mg Oral BID WC  . pantoprazole  40 mg Oral Daily   Continuous Infusions:   . sodium chloride 125 mL/hr at March 15, 2012 1230    Time spent: >30 minutes    Nicolette Gieske  [facility] Pager [phone]. If 8PM-8AM, please contact night-coverage at [email or web address], password [facility] 03/09/2012, 4:33 PM  LOS: 2 days

## 2012-03-10 ENCOUNTER — Encounter (HOSPITAL_COMMUNITY): Payer: Self-pay | Admitting: *Deleted

## 2012-03-10 DIAGNOSIS — F411 Generalized anxiety disorder: Secondary | ICD-10-CM

## 2012-03-10 LAB — URINALYSIS, ROUTINE W REFLEX MICROSCOPIC
Ketones, ur: NEGATIVE mg/dL
Leukocytes, UA: NEGATIVE
Nitrite: NEGATIVE
Specific Gravity, Urine: 1.008 (ref 1.005–1.030)
pH: 6 (ref 5.0–8.0)

## 2012-03-10 LAB — BASIC METABOLIC PANEL
CO2: 23 mEq/L (ref 19–32)
Calcium: 7.9 mg/dL — ABNORMAL LOW (ref 8.4–10.5)
GFR calc Af Amer: 46 mL/min — ABNORMAL LOW (ref >90)
GFR calc non Af Amer: 40 mL/min — ABNORMAL LOW (ref >90)
Sodium: 138 mEq/L (ref 135–145)

## 2012-03-10 MED ORDER — POTASSIUM CHLORIDE CRYS ER 20 MEQ PO TBCR
40.0000 meq | EXTENDED_RELEASE_TABLET | ORAL | Status: AC
  Administered 2012-03-10 (×2): 40 meq via ORAL
  Filled 2012-03-10 (×2): qty 2

## 2012-03-10 MED ORDER — TRAMADOL HCL 50 MG PO TABS: 50.0000 mg | ORAL_TABLET | Freq: Four times a day (QID) | ORAL | Status: DC | PRN

## 2012-03-10 MED ORDER — POLYSACCHARIDE IRON COMPLEX 150 MG PO CAPS: 150.0000 mg | ORAL_CAPSULE | Freq: Two times a day (BID) | ORAL | Status: DC

## 2012-03-10 NOTE — Discharge Summary (Signed)
Physician Discharge Summary  Elizabeth Griffin [id number] DOB: 1961-01-19 DOA: 03/07/2012  PCP: Dorna Mai, MD  Admit date: 03/07/2012 Discharge date: 03/10/2012  Recommendations for Outpatient Follow-up:  1. PCP followup in 2 weeks (please repeat basic metabolic panel in order to assess electrolytes and also kidney function; recheck patient blood pressure and decide if further antihypertensive regimen is needed, and in that case please consider the use of Norvasc or any other antihypertensive agent except ACE inhibitors.)  Discharge Diagnoses:  Active Problems:  OBESITY  ANXIETY  Syncope and collapse  Acute kidney injury  Dehydration  Diarrhea  Orthostatic hypotension   Discharge Condition: Stable and improved; patient without orthostatic changes on her vital signs at the moment of discharge. Afebrile, no acute distress. She was instructed to follow a low-sodium diet (1.5-2 g per day) and she will follow with PCP over the next 10-14 days for further evaluation and treatment of her chronic conditions.  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 Patient has a history of hidradenitis, she has a boil in her groin. For this reason she was started on Cipro which she's been on for 4 weeks. She was more recently started on Diflucan for 7 days, I'm unclear as to the reason. She's also been taking Diflucan intermittently as she develops yeast over the past 4 weeks. She was recently diagnosed with hypertension in June and started on hydrochlorothiazide. On a recent followup visit her creatinine was noted to be slightly elevated, lisinopril was added for renal protection per patient. She was prescribed 10 mg twice daily, she started lisinopril on Thursday. Thursday evening she developed  diarrhea which has been ongoing. She is also had intermittent nausea over the past 4 weeks. She fell on Friday, she believes due to a slip and fall. On the day of admission she syncopized   Hospital Course:  1-Syncope: most likely secondary to dehydration and orthostasis. Symptoms improved/practically resolved with IVF's. No abnormalities on telemetry, CE'z negative and 2-D echo with findings of grade 1 diastolic dysfunction otherwise within normal limits, ejection fraction 60-65%. BP well controlled without meds. Negative orthostatic vital signs on the date of discharge. Patient syncope episode most likely secondary to orthostasis due to what combination of diuretics and x-ray GI losses with her diarrhea. At this point antihypertensive drugs has been discontinue; patient advised to keep herself well hydrated, to follow a low-sodium diet and to follow with primary care physician over the next 2 weeks.  2-OBESITY: hypocaloric diet and exercise discussed with patient.  3-ANXIETY:stable; currently no using any medications for anxiety   4-Acute kidney injury: pre-renal in nature due to volume depletion with diarrhea and diuretics; with some coadjuvant effect from ACE inhibitors. Improved with IVF's and after stopping nephrotoxic agents. At discharge patient creatinine 1.4. No antihypertensive drugs has been prescribed at this moment. Patient has been advised to keep herself hydrated and she will follow with PCP in the next 10-14 days for further evaluation and treatment.  5-Dehydration: resolved with IVF's   6-Diarrhea: c. Diff negative. PRN lomotil given inside the hospital with good response; at discharge no further active diarrhea.  7-anemia: with mild iron deficiency component; will start her on iron pills and will recommend colonoscopy as an outpatient.    Procedures:  2D echo:  Ejection fraction 60-65%, no wall motion abnormalities; concentric hypertrophy of left ventricle with grade 1  diastolic dysfunction.  Consultations:  none  Discharge Exam: Filed Vitals:   03/10/12 0625 03/10/12 0629 03/10/12 0633 03/10/12 1100  BP: 99/63 120/70 127/83 112/53  Pulse: 80 90 95 89  Temp: 98.1 F (36.7 C)   98.1 F (36.7 C)  TempSrc: Oral   Oral  Resp: 24   20  Height:      Weight:      SpO2: 98%   100%    General: No acute distress, alert, awake and oriented x3; afebrile. Cardiovascular: S1 and S2; no rubs, no gallops Respiratory: CTA Abdomen: soft, NT, no distended; positive BS Extremities: trace edema bilaterally Neuro: non focal  Discharge Instructions  Discharge Orders    Future Appointments: Provider: Department: Dept Phone: Center:   03/11/2012 2:10 PM Harl Bowie, MD [facility] [phone] None     Future Orders Please Complete By Expires   Diet - low sodium heart healthy      Discharge instructions      Comments:   -Take medications as prescribed. -Arrange followup with PCP in about 2 weeks. -Keep herself well hydrated. -Stop taking hydrochlorothiazide and lisinopril. -Follow a low-sodium heart healthy diet (1.5-2 g of sodium restriction per day) -Follow a low calorie diet and increase physical activity to help with weight loss.       Medication List     As of 03/10/2012  1:23 PM    STOP taking these medications         ciprofloxacin 500 MG tablet   Commonly known as: CIPRO      fluconazole 150 MG tablet   Commonly known as: DIFLUCAN      hydrochlorothiazide 25 MG tablet   Commonly known as: HYDRODIURIL      ibuprofen 800 MG tablet   Commonly known as: ADVIL,MOTRIN      lisinopril 10 MG tablet   Commonly known as: PRINIVIL,ZESTRIL      metroNIDAZOLE 500 MG tablet   Commonly known as: FLAGYL      TAKE these medications         ALPRAZolam 0.5 MG tablet   Commonly known as: XANAX   Take 0.5 mg by mouth 3 (three) times daily as needed. For anxiety      diphenhydrAMINE 25 MG tablet   Commonly known as: BENADRYL   Take  50 mg by mouth at bedtime.      iron polysaccharides 150 MG capsule   Commonly known as: NIFEREX   Take 1 capsule (150 mg total) by mouth 2 (two) times daily with a meal.      multivitamin with minerals Tabs   Take 1 tablet by mouth daily.      pantoprazole 40 MG tablet   Commonly known as: PROTONIX   Take 40 mg by mouth daily. AM      traMADol 50 MG tablet   Commonly known as: ULTRAM   Take 1 tablet (50 mg total) by mouth every 6 (six) hours as needed. For pain           Follow-up Information    Follow up with [facility], [place]. Schedule an appointment as soon as possible for a visit in 2 weeks.   Contact information:   [address] [place] [address] [phone]           The results of significant diagnostics from this hospitalization (including imaging, microbiology, ancillary and laboratory)  are listed below for reference.    Significant Diagnostic Studies: Dg Hip Complete Right  03/08/2012  *RADIOLOGY REPORT*  Clinical Data: Hypotension, loss of consciousness  RIGHT HIP - COMPLETE 2+ VIEW  Comparison: None.  Findings: Hips are located.  No evidence of pelvic fracture sacral fracture.  Dedicated views right hip demonstrates no evidence of femoral neck fracture.  IMPRESSION: No evidence of pelvic fracture or right hip fracture.   Original Report Authenticated By: Suzy Bouchard, M.D.     Microbiology: Recent Results (from the past 240 hour(s))  CLOSTRIDIUM DIFFICILE BY PCR     Status: Normal   Collection Time   03/08/12  2:27 AM      Component Value Range Status Comment   C difficile by pcr NEGATIVE  NEGATIVE Final      Labs: Basic Metabolic Panel:  Lab [id number] 03/09/12 0416 03/08/12 0145  NA 138 139 136  K 3.4* 3.8 3.0*  CL 107 109 101  CO2 23 21 22   GLUCOSE 128* 106* 125*  BUN 13 18 21   CREATININE 1.49* 1.83* 2.36*  CALCIUM 7.9* 7.6* 7.9*  MG -- 1.7 --  PHOS -- -- --   Liver Function Tests:  Lab 03/08/12 0145  AST 20  ALT 13    ALKPHOS 60  BILITOT 0.1*  PROT 8.1  ALBUMIN 2.7*   CBC:  Lab 03/09/12 0416 03/08/12 0145  WBC 7.0 11.8*  NEUTROABS -- 9.2*  HGB 8.2* 9.7*  HCT 24.8* 29.3*  MCV 76.5* 75.9*  PLT 467* 589*   Cardiac Enzymes:  Lab 03/08/12 0145  CKTOTAL --  CKMB --  CKMBINDEX --  TROPONINI <0.30    Time coordinating discharge: > 30 minutes  Signed:  Gicela Schwarting  [facility] 03/10/2012, 1:23 PM

## 2012-03-11 ENCOUNTER — Ambulatory Visit (INDEPENDENT_AMBULATORY_CARE_PROVIDER_SITE_OTHER): Payer: Medicaid Other | Admitting: Surgery

## 2012-03-11 ENCOUNTER — Encounter (INDEPENDENT_AMBULATORY_CARE_PROVIDER_SITE_OTHER): Payer: Self-pay | Admitting: Surgery

## 2012-03-11 VITALS — BP 112/70 | HR 100 | Temp 98.2°F | Resp 16 | Ht 61.0 in | Wt 274.0 lb

## 2012-03-11 DIAGNOSIS — L732 Hidradenitis suppurativa: Secondary | ICD-10-CM

## 2012-03-11 NOTE — Progress Notes (Signed)
Subjective:     Patient ID: Elizabeth Griffin, female   DOB: [birth year], 51 y.o.   MRN: [id number]  HPI She is here for a followup visit. She is now off antibiotics and has had no problems with the left groin  Review of Systems     Objective:   Physical Exam On exam, the left one was back to baseline with no active purulence    Assessment:     Stable hidradenitis    Plan:     I will further antibiotics. She will call me back when she flares up

## 2012-04-24 ENCOUNTER — Ambulatory Visit (INDEPENDENT_AMBULATORY_CARE_PROVIDER_SITE_OTHER): Payer: Medicaid Other | Admitting: General Surgery

## 2012-04-24 ENCOUNTER — Telehealth (INDEPENDENT_AMBULATORY_CARE_PROVIDER_SITE_OTHER): Payer: Self-pay

## 2012-04-24 ENCOUNTER — Telehealth (INDEPENDENT_AMBULATORY_CARE_PROVIDER_SITE_OTHER): Payer: Self-pay | Admitting: General Surgery

## 2012-04-24 ENCOUNTER — Encounter (INDEPENDENT_AMBULATORY_CARE_PROVIDER_SITE_OTHER): Payer: Self-pay | Admitting: General Surgery

## 2012-04-24 VITALS — BP 134/88 | HR 92 | Temp 98.5°F | Resp 18 | Ht 65.0 in | Wt 259.0 lb

## 2012-04-24 DIAGNOSIS — L0390 Cellulitis, unspecified: Secondary | ICD-10-CM

## 2012-04-24 DIAGNOSIS — L0291 Cutaneous abscess, unspecified: Secondary | ICD-10-CM

## 2012-04-24 MED ORDER — OXYCODONE-ACETAMINOPHEN 5-325 MG PO TABS: 1.0000 | ORAL_TABLET | ORAL | Status: DC | PRN

## 2012-04-24 MED ORDER — SULFAMETHOXAZOLE-TRIMETHOPRIM 800-160 MG PO TABS: 1.0000 | ORAL_TABLET | Freq: Two times a day (BID) | ORAL | Status: AC

## 2012-04-24 NOTE — Telephone Encounter (Signed)
Patient notified of appointment provider & time changed to 2:55 pm w/Dr. Rosendo Gros.

## 2012-04-24 NOTE — Telephone Encounter (Signed)
Pt called to report worsening of pain and swelling in the groin, spreading now to her thigh.  She is on antibiotic and Tramadol, and stated no relief from the pain med at all.  She is having trouble walking, secondary to pain and swelling.  Using a warm compress to the site.  Pt requesting "something to get me through the weekend."  Has appt on Monday with  Dr. Ninfa Linden.

## 2012-04-24 NOTE — Telephone Encounter (Signed)
Called patient back this morning and she was telling me how her thigh was doing and I told her that she needed to come in to see Dr Lilyan Punt who is doing the urgent office this afternoon and I was going to be there in the room with her and she will be here at 4:45 and I also kept her apt with Dr Ninfa Linden for Monday, the area in her groin is getting bigger and more painful

## 2012-04-24 NOTE — Progress Notes (Signed)
Patient ID: Elizabeth Griffin, female   DOB: [birth year], 51 y.o.   MRN: [id number] Patient is well-known patient of Dr. Ninfa Linden with history of hidradenitis of her bilateral groins. Patient presents today with a two-week history of left-sided pain inflammation. Patient had no drainage from this area but there has been warm. He has been on by mouth antibiotics Cipro without any reduction inflammation or pain.  On exam: Patient has approximately 1 cm abscess in the left anterior thigh.with Surrounding erythema  I proceeded to incise and drain this area after numbing up well with 1% lidocaine. I made a wound approximately a centimeter was minimal express pus from this area was packed with quarter inch gauze  Assessment and plan: 1. Patient follow up with Dr. Rush Farmer Monday  2. Patient is to be started on Bactrim double strength, and back area twice a, rule outpatient premedication

## 2012-04-27 ENCOUNTER — Ambulatory Visit (INDEPENDENT_AMBULATORY_CARE_PROVIDER_SITE_OTHER): Payer: Medicaid Other | Admitting: Surgery

## 2012-04-27 ENCOUNTER — Encounter (INDEPENDENT_AMBULATORY_CARE_PROVIDER_SITE_OTHER): Payer: Self-pay | Admitting: Surgery

## 2012-04-27 VITALS — BP 138/82 | HR 88 | Temp 98.4°F | Resp 20 | Ht 62.0 in | Wt 264.4 lb

## 2012-04-27 DIAGNOSIS — Z09 Encounter for follow-up examination after completed treatment for conditions other than malignant neoplasm: Secondary | ICD-10-CM

## 2012-04-27 NOTE — Progress Notes (Signed)
Subjective:     Patient ID: Elizabeth Griffin, female   DOB: [birth year], 51 y.o.   MRN: [id number]  HPI  She is here for a followup status post incision and drainage of left thigh abscess 3 days ago. She still has discomfort but it is much better Review of Systems     Objective:   Physical Exam There is still marked erythema and induration of the thigh. I probed the abscess cavity and found no further purulence    Assessment:     Left thigh abscess and chronic hidradenitis    Plan:     We will continue the Bactrim and Percocet. She will also continue the wound care and I will see her back in one week

## 2012-04-30 ENCOUNTER — Telehealth (INDEPENDENT_AMBULATORY_CARE_PROVIDER_SITE_OTHER): Payer: Self-pay | Admitting: General Surgery

## 2012-04-30 NOTE — Telephone Encounter (Signed)
Pt called to report that her antibiotic(Bactrim) is making her feel nauseated and she is experiencing some stomach  Cramping/ I reviewed this with Dr. Lucia Gaskins since Dr. Ninfa Linden is not pagable. Dr. Lucia Gaskins said for pt to stop antibiotic/ I notified pt, she indicated that skin around wound is less red and irritated.She has a F/U scheduled with Dr. Ninfa Linden on Monday. She will call office if skin around wound  becomes more inflammed, or does not continue to improve. gy

## 2012-05-04 ENCOUNTER — Ambulatory Visit (INDEPENDENT_AMBULATORY_CARE_PROVIDER_SITE_OTHER): Payer: Medicaid Other | Admitting: Surgery

## 2012-05-04 ENCOUNTER — Encounter (INDEPENDENT_AMBULATORY_CARE_PROVIDER_SITE_OTHER): Payer: Self-pay | Admitting: Surgery

## 2012-05-04 VITALS — BP 122/74 | HR 74 | Temp 98.1°F | Resp 18 | Ht 62.0 in | Wt 260.8 lb

## 2012-05-04 DIAGNOSIS — L732 Hidradenitis suppurativa: Secondary | ICD-10-CM

## 2012-05-04 NOTE — Progress Notes (Signed)
Subjective:     Patient ID: Elizabeth Griffin, female   DOB: Sep 04, 1960, 51 y.o.   MRN: [id number]  HPI She is here for another recheck. She continues to slowly improve. She is using a cream that she found on the Internet for her hidradenitis  Review of Systems     Objective:   Physical Exam On exam, there still some granulation tissue in the left groin which I treated with silver nitrate. The abscess site is healing well with no induration or erythema    Assessment:     Chronic hidradenitis    Plan:     She will continue her current care. We will hold on any antibiotics. I will see her back in 3 weeks

## 2012-05-14 ENCOUNTER — Telehealth (INDEPENDENT_AMBULATORY_CARE_PROVIDER_SITE_OTHER): Payer: Self-pay

## 2012-05-14 ENCOUNTER — Encounter (INDEPENDENT_AMBULATORY_CARE_PROVIDER_SITE_OTHER): Payer: Self-pay | Admitting: Surgery

## 2012-05-14 ENCOUNTER — Ambulatory Visit (INDEPENDENT_AMBULATORY_CARE_PROVIDER_SITE_OTHER): Payer: Medicaid Other | Admitting: Surgery

## 2012-05-14 VITALS — BP 134/88 | HR 72 | Temp 98.3°F | Resp 16 | Ht 62.0 in | Wt 261.0 lb

## 2012-05-14 DIAGNOSIS — L732 Hidradenitis suppurativa: Secondary | ICD-10-CM

## 2012-05-14 NOTE — Telephone Encounter (Signed)
The pt called reporting she has hidradenitis and she has a new area that is very tender on the top of her thigh.  She started the Cipro last Thursday that Dr Ninfa Linden gave her.  She is not sure she can wait until her appointment with Dr Ninfa Linden.  I offered her an appointment today on urgent clinic with Dr Lucia Gaskins at 3:45 since it hasn't got any better on the abx.

## 2012-05-14 NOTE — Progress Notes (Signed)
[place], MD,  [address]  [place], [place]    [place] Phone:  [phone] FAX:  [phone]   Re:   Elizabeth Griffin DOB:   07/12/1960 MRN:   [id number]  Urgent Office  ASSESSMENT AND PLAN: 1.  Hidradenitis, recurrent.  Left groin/anterior thigh abscess.  This is a larger area than was addressed previously by Dr. Rosendo Gros.  I&D in office today.  Already on Cipro  To start sitzs baths tomorrow.  She already has an appt to see Dr. Ninfa Linden next week.  2.  GERD 3.  Anxiety 4.  Sickle cell trait 5.  Morbid obesity.  HISTORY OF PRESENT ILLNESS: Chief Complaint  Patient presents with  . Cyst    Hidradenitis inner thigh   Elizabeth Griffin is a 51 y.o. (DOB: 23-Jan-1961)  AA female who is a patient of KATES, Carloyn Jaeger, MD (Dr. Clearence Cheek left in November.  She has a new physician whom she has not met) and comes to the urgent office today for hidradenitis.  She has seen Dr. Ninfa Linden for some time.  She last saw Dr. Rosendo Gros on 04/24/2012 for and I&D of left anterior thigh abscess, then saw Dr. Ninfa Linden in follow up 04/27/2012 and 05/04/2012.  She tried taking Bactrim, but this caused nausea.  She is currently on Cipro since Thursday, 05/07/2012.  Over the last few days, she has had increasing left groin pain and swelling and said that she could not wait to see Dr. Ninfa Linden.  So she was brought into the [facility].  Hospitalized Sept 2013 for syncope, probably secondary to dehydration.  Past Medical History  Diagnosis Date  . GERD (gastroesophageal reflux disease)   . Anemia   . Anxiety   . Sickle cell trait   . Hydradenitis    Allergies  Allergen Reactions  . Adhesive (Tape) Hives  . Bactrim (Sulfamethoxazole W-Trimethoprim)   . Lisinopril     dehydration   Social History: On disability for "skin infections" since 2010.  PHYSICAL EXAM: BP 134/88  Pulse 72  Temp 98.3 F (36.8 C) (Temporal)  Resp 16  Ht 5\' 2"   (1.575 m)  Wt 261 lb (118.389 kg)  BMI 47.74 kg/m2  Left groin/thigh:  1.5 cm open wound (this is the incision Dr. Rosendo Gros did).  I can express pus by pushing medial to this incision.  Medial to this, she has a 4 cm indurated area c/w an abscess.  Procedure:  While in the office, I cleaned her left thigh with betadine.  I infiltrated about 10 cc of 1% xylocaine.  I then extended the incision medially to about 8 cm.  I entered at least 2 pockets of pus.  I dressed it with gauze.  DATA REVIEWED: Epic notes.  Alphonsa Overall, MD, [place] Office:  [phone]

## 2012-05-22 ENCOUNTER — Ambulatory Visit (INDEPENDENT_AMBULATORY_CARE_PROVIDER_SITE_OTHER): Payer: Medicaid Other | Admitting: Surgery

## 2012-05-22 ENCOUNTER — Encounter (INDEPENDENT_AMBULATORY_CARE_PROVIDER_SITE_OTHER): Payer: Self-pay | Admitting: Surgery

## 2012-05-22 VITALS — BP 128/88 | HR 82 | Temp 98.1°F | Resp 12 | Ht 61.0 in | Wt 257.8 lb

## 2012-05-22 DIAGNOSIS — Z09 Encounter for follow-up examination after completed treatment for conditions other than malignant neoplasm: Secondary | ICD-10-CM

## 2012-05-22 NOTE — Progress Notes (Signed)
Subjective:     Patient ID: Elizabeth Griffin, female   DOB: 1961-04-09, 51 y.o.   MRN: [id number]  HPI  She is doing much better since her last incision and drainage of the left thigh. She continues on Cipro. She has another yeast infection Review of Systems     Objective:   Physical Exam On exam, there is no swelling and no erythema. The open wounds are clean.    Assessment:     Chronic hidradenitis status post incision and drainage    Plan:     She will continue her current care. I renewed her Percocet. I also wrote her for Diflucan. I will see her back on the 23rd

## 2012-05-26 ENCOUNTER — Encounter (INDEPENDENT_AMBULATORY_CARE_PROVIDER_SITE_OTHER): Payer: Medicaid Other | Admitting: Surgery

## 2012-06-01 ENCOUNTER — Ambulatory Visit (INDEPENDENT_AMBULATORY_CARE_PROVIDER_SITE_OTHER): Payer: Medicaid Other | Admitting: Surgery

## 2012-06-01 ENCOUNTER — Encounter (INDEPENDENT_AMBULATORY_CARE_PROVIDER_SITE_OTHER): Payer: Self-pay | Admitting: Surgery

## 2012-06-01 VITALS — BP 126/84 | HR 68 | Resp 16 | Ht 61.0 in | Wt 256.6 lb

## 2012-06-01 DIAGNOSIS — L732 Hidradenitis suppurativa: Secondary | ICD-10-CM

## 2012-06-01 NOTE — Progress Notes (Signed)
Subjective:     Patient ID: Elizabeth Griffin, female   DOB: [birth year], 51 y.o.   MRN: [id number]  HPI She is now doing well and tolerating the antibiotics. She has no discomfort  Review of Systems     Objective:   Physical Exam The left thigh wounds were healing well    Assessment:     Chronic adenitis    Plan:     She will finish her course of antibiotics. I will see her back as needed

## 2012-06-16 ENCOUNTER — Telehealth (INDEPENDENT_AMBULATORY_CARE_PROVIDER_SITE_OTHER): Payer: Self-pay | Admitting: General Surgery

## 2012-06-16 NOTE — Telephone Encounter (Signed)
Pt called to ask for pain meds for hidradenitis under her Rt arm.  States she received antibiotics already.  Please [id number].

## 2012-06-17 NOTE — Telephone Encounter (Signed)
You can call her in Hydrocodone per protocol. If she wants percocet instead, someone will have to write her for 5/325  #40 with no refills

## 2012-06-17 NOTE — Telephone Encounter (Signed)
Per Dr. Ninfa Linden, Rx for Percocet 5/325 mg, # 40, 1-2 po Q4-6H prn pain, no refill ordered and signed by Dr. Georgette Dover in the office.  Pt knows to pick up at the front desk.

## 2012-08-21 ENCOUNTER — Ambulatory Visit: Payer: Medicare Other | Admitting: Obstetrics and Gynecology

## 2012-08-21 ENCOUNTER — Encounter: Payer: Self-pay | Admitting: Obstetrics and Gynecology

## 2012-08-21 VITALS — BP 120/82 | Wt 255.0 lb

## 2012-08-21 DIAGNOSIS — R5383 Other fatigue: Secondary | ICD-10-CM

## 2012-08-21 MED ORDER — ALPRAZOLAM 0.5 MG PO TABS: 0.5000 mg | ORAL_TABLET | Freq: Three times a day (TID) | ORAL | Status: DC | PRN

## 2012-08-21 NOTE — Progress Notes (Signed)
Menopausal symptoms:anxiety, hot flashes, moodiness, no energy, vaginal dryness  The patient is not taking hormone replacement therapy The patient  is not taking a Calcium supplement. The patient participates in regular exercise: yes. Post-menopausal bleeding:no  The patient is not sexually active.  Last Pap: was normal August  2013 Last mammogram: was normal August  2013  History of DVT/PE: No Family history of breast cancer: Yes 1ST COUSIN ON MOM SIDE Family history of endometrial cancer:No

## 2012-08-21 NOTE — Progress Notes (Signed)
52 YO complaining of hot flashes followed by anxiety,  moodiness, lack of  energy, and vaginal dryness.  States Estrace makes her feel like she has a yeast infection and has used over the counter preparations to no avail.  Has chronic anemia.  Has been taking Maca but finds exercise helps the most..  Needs refill on her Xanax until she sees her family doctor.   A: Menopausal Symptoms     Fatigue     H/O Anemia     Vaginal Dryness  P: sample; Vagifem 10 mg  #10  1 pv every other day x 7 days then twice weekly      Reviewed options for managing menopausal symptoms: herbal, hormonal & miscellaneous     Patient to try [place] recent labs      Serum ferritn & Vitamin D 25 -H-pending      RTO-as scheduled or prn  Prathik Aman, PA-C

## 2012-08-25 ENCOUNTER — Telehealth: Payer: Self-pay

## 2012-08-25 NOTE — Telephone Encounter (Signed)
Message copied by Ilean China on Tue Aug 25, 2012 11:12 AM ------      Message from: Elizabeth Griffin      Created: Mon Aug 24, 2012  9:02 AM       Please initiate the Vitamin D Protocol and advise patient that her iron reserved is good (44) but that we want it to be 50 or more so to take iron twice daily for 6 weeks to get it there.  Thank you,  EP ------

## 2012-08-25 NOTE — Telephone Encounter (Signed)
Spoke with pt rgd labs informed iron wnl vit d low informed pt rx for vit d called to pharm per protocol pt voice understanding pt also states was giving samples of vagifem pt wants rx advised pt will consult with ep pt voice understanding

## 2012-08-25 NOTE — Telephone Encounter (Signed)
Lm on vm tcb rgd labs 

## 2012-08-26 ENCOUNTER — Other Ambulatory Visit: Payer: Self-pay | Admitting: Obstetrics and Gynecology

## 2012-08-26 NOTE — Telephone Encounter (Signed)
Pt never picked up the script left at the front desk for Percocet;  Destroyed on 08/26/12.

## 2012-08-31 ENCOUNTER — Encounter (HOSPITAL_COMMUNITY): Payer: Self-pay | Admitting: Emergency Medicine

## 2012-08-31 ENCOUNTER — Emergency Department (HOSPITAL_COMMUNITY): Payer: Medicare Other

## 2012-08-31 ENCOUNTER — Emergency Department (HOSPITAL_COMMUNITY)
Admission: EM | Admit: 2012-08-31 | Discharge: 2012-08-31 | Disposition: A | Discharge status: Home / Self Care | Payer: Medicare Other | Attending: Emergency Medicine | Admitting: Emergency Medicine

## 2012-08-31 DIAGNOSIS — S6991XA Unspecified injury of right wrist, hand and finger(s), initial encounter: Secondary | ICD-10-CM

## 2012-08-31 DIAGNOSIS — Z79899 Other long term (current) drug therapy: Secondary | ICD-10-CM | POA: Insufficient documentation

## 2012-08-31 DIAGNOSIS — Z8719 Personal history of other diseases of the digestive system: Secondary | ICD-10-CM | POA: Insufficient documentation

## 2012-08-31 DIAGNOSIS — S6990XA Unspecified injury of unspecified wrist, hand and finger(s), initial encounter: Secondary | ICD-10-CM | POA: Insufficient documentation

## 2012-08-31 DIAGNOSIS — Z872 Personal history of diseases of the skin and subcutaneous tissue: Secondary | ICD-10-CM | POA: Insufficient documentation

## 2012-08-31 DIAGNOSIS — Z8639 Personal history of other endocrine, nutritional and metabolic disease: Secondary | ICD-10-CM | POA: Insufficient documentation

## 2012-08-31 DIAGNOSIS — F411 Generalized anxiety disorder: Secondary | ICD-10-CM | POA: Insufficient documentation

## 2012-08-31 DIAGNOSIS — Z862 Personal history of diseases of the blood and blood-forming organs and certain disorders involving the immune mechanism: Secondary | ICD-10-CM | POA: Insufficient documentation

## 2012-08-31 DIAGNOSIS — Z8742 Personal history of other diseases of the female genital tract: Secondary | ICD-10-CM | POA: Insufficient documentation

## 2012-08-31 DIAGNOSIS — Y929 Unspecified place or not applicable: Secondary | ICD-10-CM | POA: Insufficient documentation

## 2012-08-31 DIAGNOSIS — X58XXXA Exposure to other specified factors, initial encounter: Secondary | ICD-10-CM | POA: Insufficient documentation

## 2012-08-31 DIAGNOSIS — Q891 Congenital malformations of adrenal gland: Secondary | ICD-10-CM | POA: Insufficient documentation

## 2012-08-31 DIAGNOSIS — Y939 Activity, unspecified: Secondary | ICD-10-CM | POA: Insufficient documentation

## 2012-08-31 DIAGNOSIS — Z8619 Personal history of other infectious and parasitic diseases: Secondary | ICD-10-CM | POA: Insufficient documentation

## 2012-08-31 NOTE — ED Notes (Signed)
Pt not in the room when d/c instruction is ready.

## 2012-08-31 NOTE — ED Notes (Signed)
Pt thinks that she might have injured her hand and caused a fx. Pt had this pain several weeks ago and then the pain went away. Pt does not recall any direct injury to her hand. Pt pain is located on base of her R #3 digit. Pt has full ROM, but with some pain noted.

## 2012-08-31 NOTE — ED Notes (Signed)
Pt left without dc instructions.

## 2012-08-31 NOTE — ED Provider Notes (Signed)
History    This chart was scribed for non-physician practitioner working with Elizabeth Frames, MD by ED Scribe, Elizabeth Griffin. This patient was seen in room WTR7/WTR7 and the patient's care was started at 6:06 PM.   CSN: [id number]  Arrival date & time 08/31/12  1606   First MD Initiated Contact with Patient 08/31/12 1840      No chief complaint on file.   (Consider location/radiation/quality/duration/timing/severity/associated sxs/prior treatment) The history is provided by the patient. No language interpreter was used.   Elizabeth Griffin is a 52 y.o. female who presents to the Emergency Department complaining of moderate constant right hand pain (# 3 digit) onset 3 weeks ago. Pt does not recall where the pain came from but states her right hand hurts over the # 3 digit. Movement exacerbates pain in her right hand. She has full ROM in right hand. Pt denies fever, chills, cough, nausea, vomiting, diarrhea, SOB, weakness, and any other associated symptoms. Pt's PCP is Dr. Clearence Griffin.   Past Medical History  Diagnosis Date  . GERD (gastroesophageal reflux disease)   . Anemia   . Anxiety   . Sickle cell trait   . Hydradenitis   . Vitamin D deficiency   . BV (bacterial vaginosis)   . Endometrial mass     Past Surgical History  Procedure Laterality Date  . Cholecystectomy    . Axillary hidradenitis excision      right - 04/22/2006; left - 01/02/2009  . Perineal hidradenitis excision  01/23/2011    right buttock  . Hysteroscopy w/d&c  07/15/2008  . Hysteroscopy w/ endometrial ablation  11/23/2009    and D & C  . Hydradenitis excision  07/31/2011    Procedure: EXCISION HYDRADENITIS GROIN;  Surgeon: Harl Bowie, MD;  Location: [facility];  Service: General;  Laterality: Right;    Family History  Problem Relation Age of Onset  . Hypertension Brother   . Cancer Son     leukemia    History  Substance Use Topics  . Smoking status: Never Smoker   . Smokeless tobacco: Never  Used  . Alcohol Use: No    OB History   Grav Para Term Preterm Abortions TAB SAB Ect Mult Living   1 1        1       Review of Systems  Constitutional: Negative for fever and diaphoresis.  HENT: Negative for neck pain and neck stiffness.   Eyes: Negative for visual disturbance.  Respiratory: Negative for apnea, chest tightness and shortness of breath.   Cardiovascular: Negative for chest pain and palpitations.  Gastrointestinal: Negative for nausea, vomiting, diarrhea and constipation.  Genitourinary: Negative for dysuria.  Musculoskeletal: Positive for myalgias and arthralgias. Negative for gait problem.  Skin: Negative for rash.  Neurological: Negative for dizziness, weakness, light-headedness, numbness and headaches.    Allergies  Adhesive; Bactrim; Ibuprofen; Lisinopril; and Shellfish allergy  Home Medications   Current Outpatient Rx  Name  Route  Sig  Dispense  Refill  . ALPRAZolam (XANAX) 0.5 MG tablet   Oral   Take 1 tablet (0.5 mg total) by mouth 3 (three) times daily as needed for anxiety.   30 tablet   0   . diphenhydrAMINE (BENADRYL) 25 MG tablet   Oral   Take 50 mg by mouth at bedtime.         . Multiple Vitamin (MULTIVITAMIN) tablet   Oral   Take 1 tablet by mouth daily.  BP 149/84  Pulse 93  Temp(Src) 97.1 F (36.2 C)  SpO2 100%  Physical Exam  Nursing note and vitals reviewed. Constitutional: She is oriented to person, place, and time. She appears well-developed and well-nourished. No distress.  HENT:  Head: Normocephalic and atraumatic.  Eyes: Conjunctivae and EOM are normal.  Neck: Normal range of motion. Neck supple.  No meningeal signs  Cardiovascular: Normal rate, regular rhythm, normal heart sounds and intact distal pulses.   Pulmonary/Chest: Effort normal and breath sounds normal. No respiratory distress. She has no wheezes. She has no rales. She exhibits no tenderness.  Abdominal: Soft. Bowel sounds are normal. She  exhibits no distension. There is no tenderness. There is no rebound and no guarding.  Musculoskeletal: Normal range of motion. She exhibits tenderness. She exhibits no edema.  Tenderness upon palpation to the # 3 digit of the right hand region. FROM. Two point discrimination intact  Neurological: She is alert and oriented to person, place, and time. No cranial nerve deficit.  Skin: Skin is warm and dry. She is not diaphoretic. No erythema.    ED Course  Procedures (including critical care time) DIAGNOSTIC STUDIES: Oxygen Saturation is 100% on room air, normal by my interpretation.    COORDINATION OF CARE: 6:49 PM Discussed ED treatment with pt and pt agrees.     Labs Reviewed - No data to display Dg Hand Complete Right  08/31/2012  *RADIOLOGY REPORT*  Clinical Data: Third metacarpal phalangeal joint pain  RIGHT HAND - COMPLETE 3+ VIEW  Comparison: None.  Findings: No acute fracture and no dislocation.  Minimal degenerative changes in the digits.  IMPRESSION: No acute bony pathology.  Mild degenerative change.   Original Report Authenticated By: Marybelle Killings, M.D.      1. Hand injury, right, initial encounter       MDM  Imaging shows no fracture. Directed pt to ice injury, take acetaminophen or ibuprofen for pain, and to elevate and rest the injury when possible. Applied ace wrap for support and comfort.  At this time there does not appear to be any evidence of an acute emergency medical condition and the patient appears stable for discharge with appropriate outpatient follow up. Diagnosis was discussed with patient who verbalizes understanding and is agreeable to discharge.      Coralee North, PA-C 09/01/12 1550

## 2012-09-01 NOTE — ED Provider Notes (Signed)
Medical screening examination/treatment/procedure(s) were performed by non-physician practitioner and as supervising physician I was immediately available for consultation/collaboration.   [name], MD 09/01/12 [phone]

## 2012-12-17 ENCOUNTER — Telehealth: Payer: Self-pay | Admitting: Internal Medicine

## 2012-12-17 NOTE — Telephone Encounter (Signed)
S/W PT IN REF TO NP APPT ON 01/12/13@1 :30 REFERRING DR DEVESHWAR DX-ABNORMAL IFE AND ELEVATED ESR MAILED NP PACKET

## 2012-12-22 ENCOUNTER — Telehealth: Payer: Self-pay | Admitting: Internal Medicine

## 2012-12-22 NOTE — Telephone Encounter (Signed)
C/D 12/22/12 for appt. 01/12/13

## 2012-12-23 ENCOUNTER — Other Ambulatory Visit (HOSPITAL_BASED_OUTPATIENT_CLINIC_OR_DEPARTMENT_OTHER): Payer: Self-pay | Admitting: Rheumatology

## 2012-12-23 DIAGNOSIS — R700 Elevated erythrocyte sedimentation rate: Secondary | ICD-10-CM

## 2012-12-23 DIAGNOSIS — C801 Malignant (primary) neoplasm, unspecified: Secondary | ICD-10-CM

## 2013-01-12 ENCOUNTER — Ambulatory Visit: Payer: Medicare Other

## 2013-01-12 ENCOUNTER — Ambulatory Visit: Payer: Medicare Other | Admitting: Internal Medicine

## 2013-01-12 ENCOUNTER — Other Ambulatory Visit: Payer: Medicare Other | Admitting: Lab

## 2013-01-13 NOTE — Progress Notes (Signed)
This encounter was created in error - please disregard.

## 2013-01-14 ENCOUNTER — Other Ambulatory Visit: Payer: Self-pay

## 2013-01-14 DIAGNOSIS — Z1231 Encounter for screening mammogram for malignant neoplasm of breast: Secondary | ICD-10-CM

## 2013-01-20 ENCOUNTER — Emergency Department (HOSPITAL_COMMUNITY)
Admission: EM | Admit: 2013-01-20 | Discharge: 2013-01-21 | Disposition: A | Discharge status: Home / Self Care | Payer: No Typology Code available for payment source | Attending: Emergency Medicine | Admitting: Emergency Medicine

## 2013-01-20 DIAGNOSIS — Z872 Personal history of diseases of the skin and subcutaneous tissue: Secondary | ICD-10-CM | POA: Insufficient documentation

## 2013-01-20 DIAGNOSIS — S62639A Displaced fracture of distal phalanx of unspecified finger, initial encounter for closed fracture: Secondary | ICD-10-CM | POA: Insufficient documentation

## 2013-01-20 DIAGNOSIS — Y92410 Unspecified street and highway as the place of occurrence of the external cause: Secondary | ICD-10-CM | POA: Insufficient documentation

## 2013-01-20 DIAGNOSIS — F411 Generalized anxiety disorder: Secondary | ICD-10-CM | POA: Insufficient documentation

## 2013-01-20 DIAGNOSIS — S199XXA Unspecified injury of neck, initial encounter: Secondary | ICD-10-CM | POA: Insufficient documentation

## 2013-01-20 DIAGNOSIS — Z8719 Personal history of other diseases of the digestive system: Secondary | ICD-10-CM | POA: Insufficient documentation

## 2013-01-20 DIAGNOSIS — S20219A Contusion of unspecified front wall of thorax, initial encounter: Secondary | ICD-10-CM | POA: Insufficient documentation

## 2013-01-20 DIAGNOSIS — S0993XA Unspecified injury of face, initial encounter: Secondary | ICD-10-CM | POA: Insufficient documentation

## 2013-01-20 DIAGNOSIS — IMO0002 Reserved for concepts with insufficient information to code with codable children: Secondary | ICD-10-CM | POA: Insufficient documentation

## 2013-01-20 DIAGNOSIS — S62609A Fracture of unspecified phalanx of unspecified finger, initial encounter for closed fracture: Secondary | ICD-10-CM

## 2013-01-20 DIAGNOSIS — Y939 Activity, unspecified: Secondary | ICD-10-CM | POA: Insufficient documentation

## 2013-01-20 DIAGNOSIS — S6000XA Contusion of unspecified finger without damage to nail, initial encounter: Secondary | ICD-10-CM | POA: Insufficient documentation

## 2013-01-20 DIAGNOSIS — Z862 Personal history of diseases of the blood and blood-forming organs and certain disorders involving the immune mechanism: Secondary | ICD-10-CM | POA: Insufficient documentation

## 2013-01-20 DIAGNOSIS — Z8742 Personal history of other diseases of the female genital tract: Secondary | ICD-10-CM | POA: Insufficient documentation

## 2013-01-20 DIAGNOSIS — Z8639 Personal history of other endocrine, nutritional and metabolic disease: Secondary | ICD-10-CM | POA: Insufficient documentation

## 2013-01-20 DIAGNOSIS — D649 Anemia, unspecified: Secondary | ICD-10-CM | POA: Insufficient documentation

## 2013-01-20 DIAGNOSIS — S20211A Contusion of right front wall of thorax, initial encounter: Secondary | ICD-10-CM

## 2013-01-20 DIAGNOSIS — R220 Localized swelling, mass and lump, head: Secondary | ICD-10-CM | POA: Insufficient documentation

## 2013-01-20 DIAGNOSIS — S90129A Contusion of unspecified lesser toe(s) without damage to nail, initial encounter: Secondary | ICD-10-CM | POA: Insufficient documentation

## 2013-01-21 ENCOUNTER — Emergency Department (HOSPITAL_COMMUNITY): Payer: No Typology Code available for payment source

## 2013-01-21 ENCOUNTER — Encounter (HOSPITAL_COMMUNITY): Payer: Self-pay | Admitting: Emergency Medicine

## 2013-01-21 MED ORDER — HYDROCODONE-ACETAMINOPHEN 5-325 MG PO TABS
1.0000 | ORAL_TABLET | Freq: Once | ORAL | Status: AC
  Administered 2013-01-21: 1 via ORAL
  Filled 2013-01-21: qty 1

## 2013-01-21 MED ORDER — HYDROCODONE-ACETAMINOPHEN 5-325 MG PO TABS: 1.0000 | ORAL_TABLET | ORAL | Status: DC | PRN

## 2013-01-21 MED ORDER — CYCLOBENZAPRINE HCL 10 MG PO TABS: 10.0000 mg | ORAL_TABLET | Freq: Three times a day (TID) | ORAL | Status: DC | PRN
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
## 2013-01-21 NOTE — Progress Notes (Signed)
Orthopedic Tech Progress Note Patient Details:  Elizabeth Griffin September 21, 1960 [id number]  Ortho Devices Type of Ortho Device: Buddy tape   Katheren Shams 01/21/2013, 1:30 AM
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
## 2013-01-23 NOTE — ED Provider Notes (Signed)
Medical screening examination/treatment/procedure(s) were performed by non-physician practitioner and as supervising physician I was immediately available for consultation/collaboration.  Kalman Drape, MD 01/23/13 [phone]

## 2013-02-04 ENCOUNTER — Ambulatory Visit: Payer: Medicare Other

## 2013-02-24 ENCOUNTER — Ambulatory Visit: Payer: Medicare Other

## 2013-02-25 ENCOUNTER — Other Ambulatory Visit: Payer: Self-pay

## 2013-02-25 DIAGNOSIS — Z1231 Encounter for screening mammogram for malignant neoplasm of breast: Secondary | ICD-10-CM

## 2013-03-17 ENCOUNTER — Ambulatory Visit
Admission: RE | Admit: 2013-03-17 | Discharge: 2013-03-17 | Disposition: A | Discharge status: Home / Self Care | Payer: Medicare Other | Source: Ambulatory Visit

## 2013-03-17 ENCOUNTER — Telehealth (INDEPENDENT_AMBULATORY_CARE_PROVIDER_SITE_OTHER): Payer: Self-pay | Admitting: General Surgery

## 2013-03-17 DIAGNOSIS — Z1231 Encounter for screening mammogram for malignant neoplasm of breast: Secondary | ICD-10-CM

## 2013-03-17 NOTE — Telephone Encounter (Signed)
Pt called for Dr. Trevor Mace assistant, to let her know that she "is having another new episode of her hidradenitis" and is requesting antibiotics.  If Dr. Ninfa Linden is amenable to calling them in, please use the [facility] on [address] and let her know to pick them up.

## 2013-03-18 ENCOUNTER — Encounter (INDEPENDENT_AMBULATORY_CARE_PROVIDER_SITE_OTHER): Payer: Self-pay | Admitting: Surgery

## 2013-03-18 ENCOUNTER — Ambulatory Visit (INDEPENDENT_AMBULATORY_CARE_PROVIDER_SITE_OTHER): Payer: Medicare Other | Admitting: Surgery

## 2013-03-18 VITALS — BP 128/70 | HR 77 | Temp 98.3°F | Resp 14 | Ht 63.0 in | Wt 276.6 lb

## 2013-03-18 DIAGNOSIS — L732 Hidradenitis suppurativa: Secondary | ICD-10-CM

## 2013-03-18 MED ORDER — ERYTHROMYCIN 2 % EX GEL: Freq: Every day | CUTANEOUS | Status: DC

## 2013-03-18 MED ORDER — FLUCONAZOLE 200 MG PO TABS: 200.0000 mg | ORAL_TABLET | Freq: Every day | ORAL | Status: DC

## 2013-03-18 MED ORDER — CLINDAMYCIN HCL 150 MG PO CAPS: 300.0000 mg | ORAL_CAPSULE | Freq: Three times a day (TID) | ORAL | Status: DC

## 2013-03-18 MED ORDER — CLINDAMYCIN HCL 150 MG PO CAPS: 300.0000 mg | ORAL_CAPSULE | Freq: Three times a day (TID) | ORAL | Status: AC

## 2013-03-18 NOTE — Progress Notes (Signed)
Subjective:     Patient ID: Elizabeth Griffin, female   DOB: 09-10-1960, 52 y.o.   MRN: [id number]  HPI She is here today because of a flare up of her hidradenitis. I have actually not seen her in approximately 10 months. She reports she has been doing very well and has not been on antibiotics. She then started steroids this July for rheumatoid arthritis. She started flaring up over the past week in the right axilla and left thigh/groin area  Review of Systems     Objective:   Physical Exam On exam, she does have superficial boils in the axilla on the right and thigh on the left consistent with chronic hidradenitis. There is nothing to drain today    Assessment:     Hidradenitis     Plan:     I am going to start her on clindamycin both orally and as the solution for  Cutaneous use. I will also call and Diflucan as she is prone to yeast infections. I will see her back in approximately 2 weeks

## 2013-03-18 NOTE — Addendum Note (Signed)
Addended by: Coralie Keens A on: 03/18/2013 02:48 PM   Modules accepted: Orders

## 2013-03-19 ENCOUNTER — Other Ambulatory Visit: Payer: Self-pay | Admitting: Family Medicine

## 2013-03-19 DIAGNOSIS — N63 Unspecified lump in unspecified breast: Secondary | ICD-10-CM

## 2013-03-22 ENCOUNTER — Telehealth (INDEPENDENT_AMBULATORY_CARE_PROVIDER_SITE_OTHER): Payer: Self-pay | Admitting: General Surgery

## 2013-03-22 NOTE — Telephone Encounter (Signed)
[facility]ed to [facility] for Topical Clindamycin 1 % solution BID disp #3 week supply with 2 refills

## 2013-04-01 ENCOUNTER — Encounter (INDEPENDENT_AMBULATORY_CARE_PROVIDER_SITE_OTHER): Payer: Medicare Other | Admitting: Surgery

## 2013-04-20 ENCOUNTER — Encounter (INDEPENDENT_AMBULATORY_CARE_PROVIDER_SITE_OTHER): Payer: Medicare Other | Admitting: Surgery

## 2013-05-03 ENCOUNTER — Other Ambulatory Visit: Payer: Medicare Other

## 2013-05-04 ENCOUNTER — Encounter (INDEPENDENT_AMBULATORY_CARE_PROVIDER_SITE_OTHER): Payer: Medicare Other | Admitting: Surgery

## 2013-05-18 ENCOUNTER — Encounter (INDEPENDENT_AMBULATORY_CARE_PROVIDER_SITE_OTHER): Payer: Medicare Other | Admitting: Surgery

## 2013-05-20 ENCOUNTER — Ambulatory Visit
Admission: RE | Admit: 2013-05-20 | Discharge: 2013-05-20 | Disposition: A | Discharge status: Home / Self Care | Payer: Medicare Other | Source: Ambulatory Visit | Attending: Family Medicine | Admitting: Family Medicine

## 2013-05-20 DIAGNOSIS — N63 Unspecified lump in unspecified breast: Secondary | ICD-10-CM

## 2013-08-10 ENCOUNTER — Other Ambulatory Visit: Payer: Self-pay | Admitting: Family Medicine

## 2013-08-10 DIAGNOSIS — N63 Unspecified lump in unspecified breast: Secondary | ICD-10-CM

## 2013-08-23 ENCOUNTER — Ambulatory Visit
Admission: RE | Admit: 2013-08-23 | Discharge: 2013-08-23 | Disposition: A | Discharge status: Home / Self Care | Payer: Medicare Other | Source: Ambulatory Visit | Attending: Family Medicine | Admitting: Family Medicine

## 2013-08-23 DIAGNOSIS — N63 Unspecified lump in unspecified breast: Secondary | ICD-10-CM

## 2013-09-17 ENCOUNTER — Other Ambulatory Visit (INDEPENDENT_AMBULATORY_CARE_PROVIDER_SITE_OTHER): Payer: Self-pay | Admitting: Surgery

## 2013-12-23 IMAGING — CR DG HAND COMPLETE 3+V*R*
3 series · 3 of 3 positions shown · non-contrast
Comparison: None.

CLINICAL DATA: Third metacarpal phalangeal joint pain

RIGHT HAND - COMPLETE 3+ VIEW

[x hand pa right (1 of 2)]
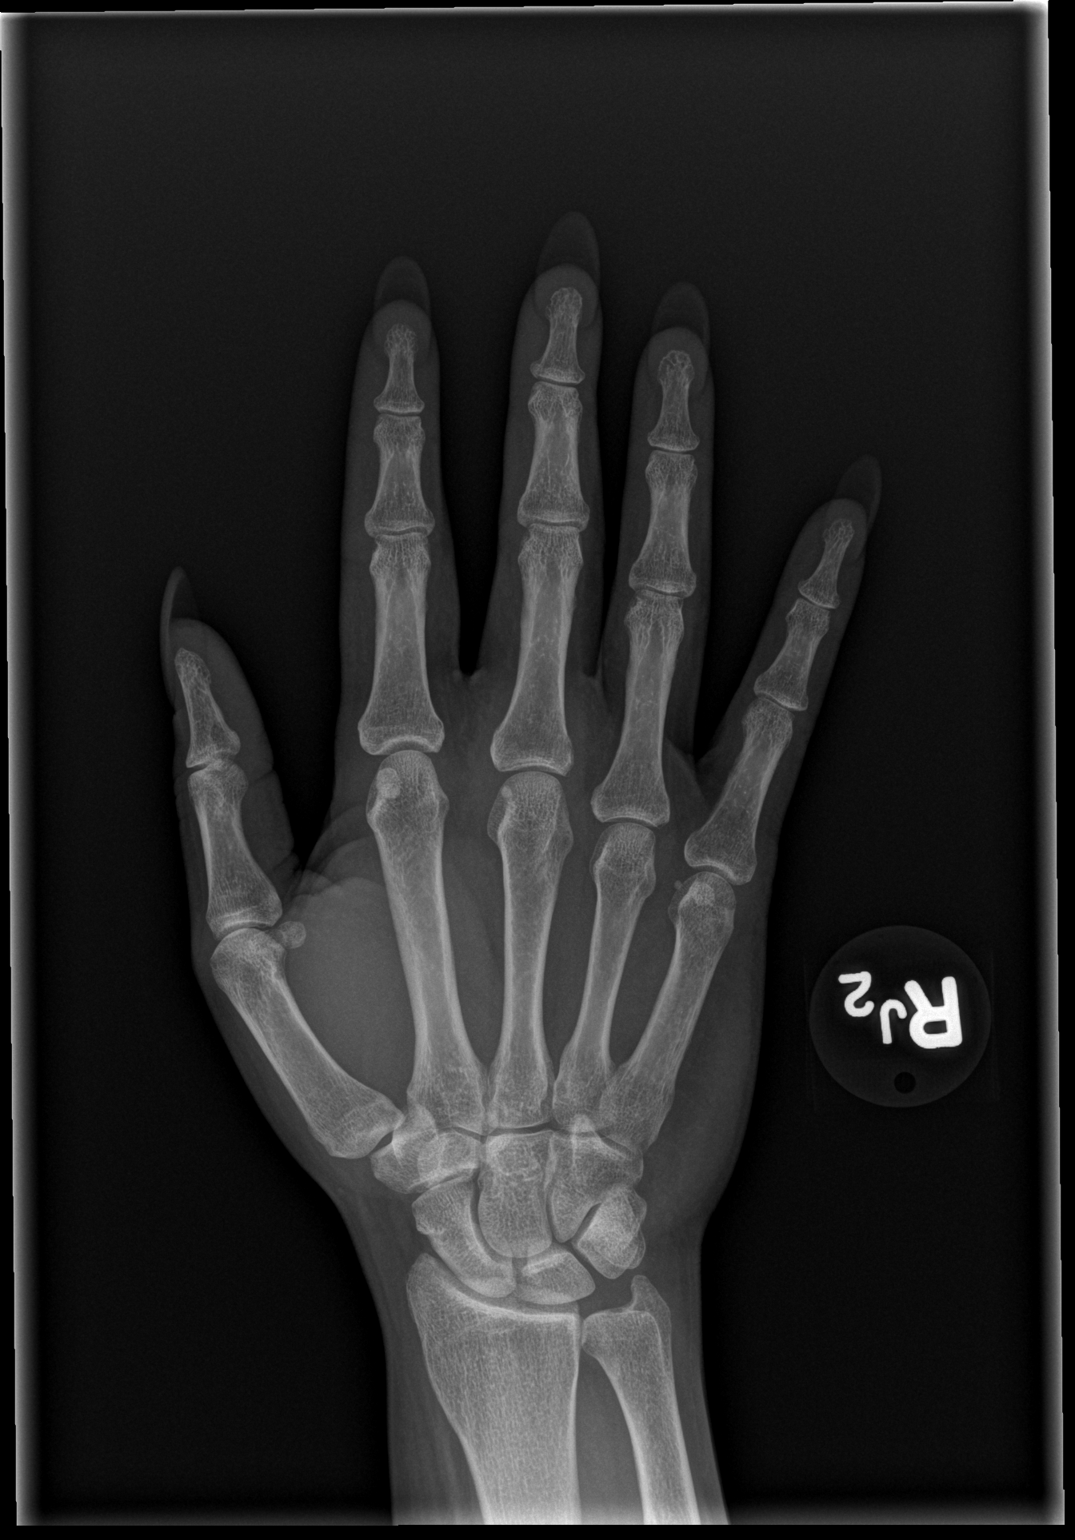

[x hand pa right (2 of 2)]
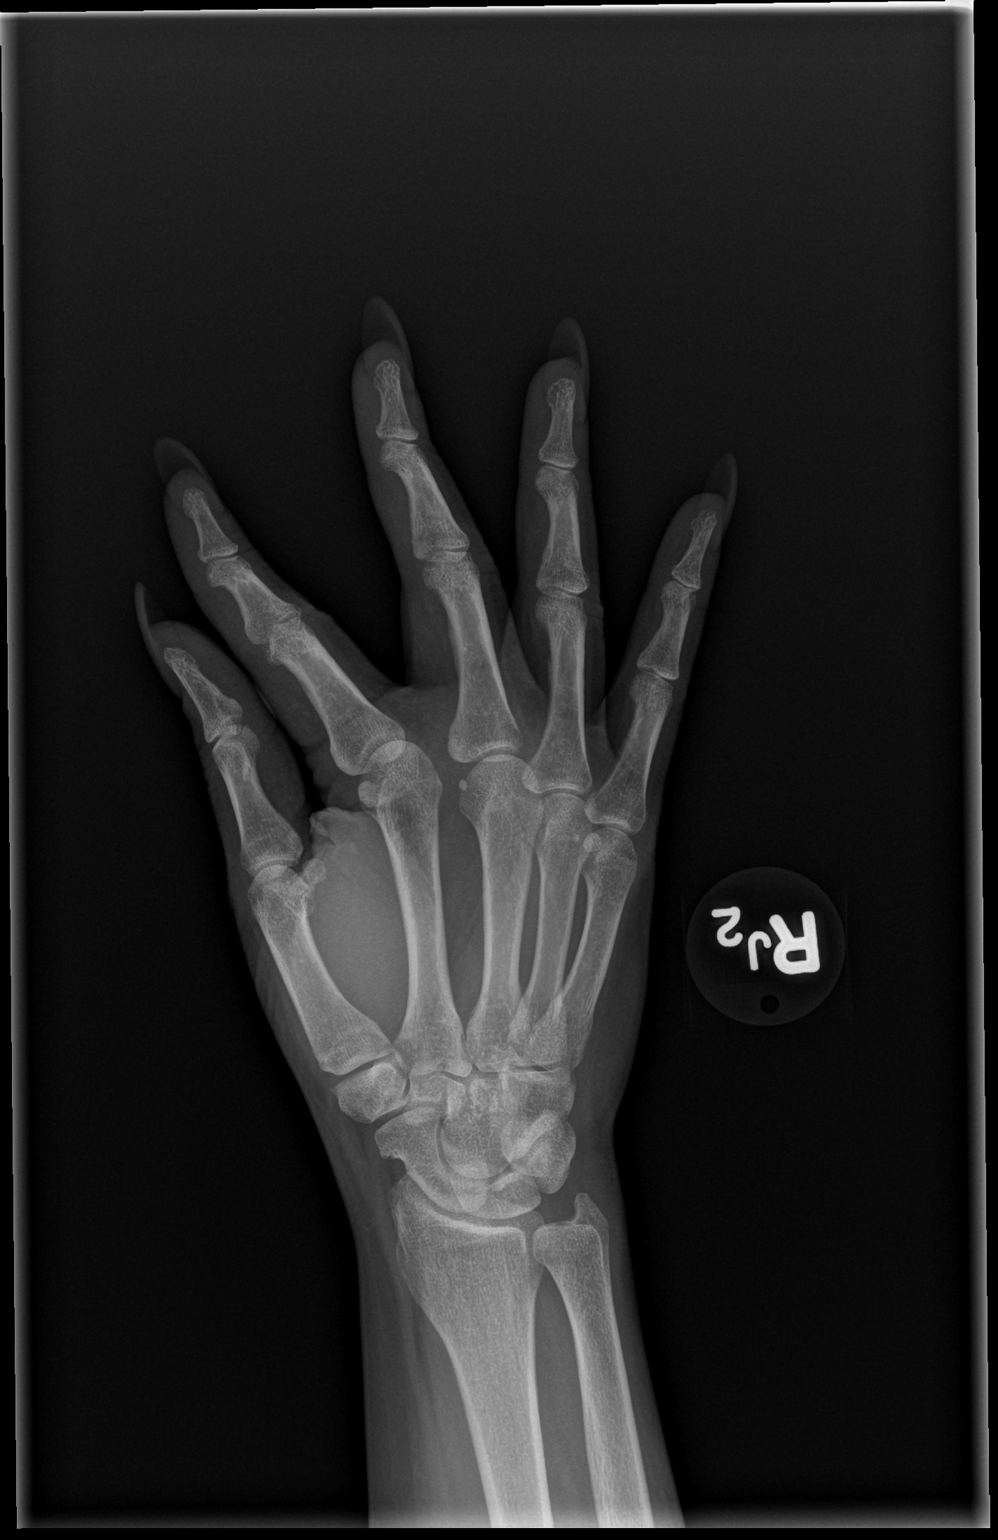

[x hand lat right]
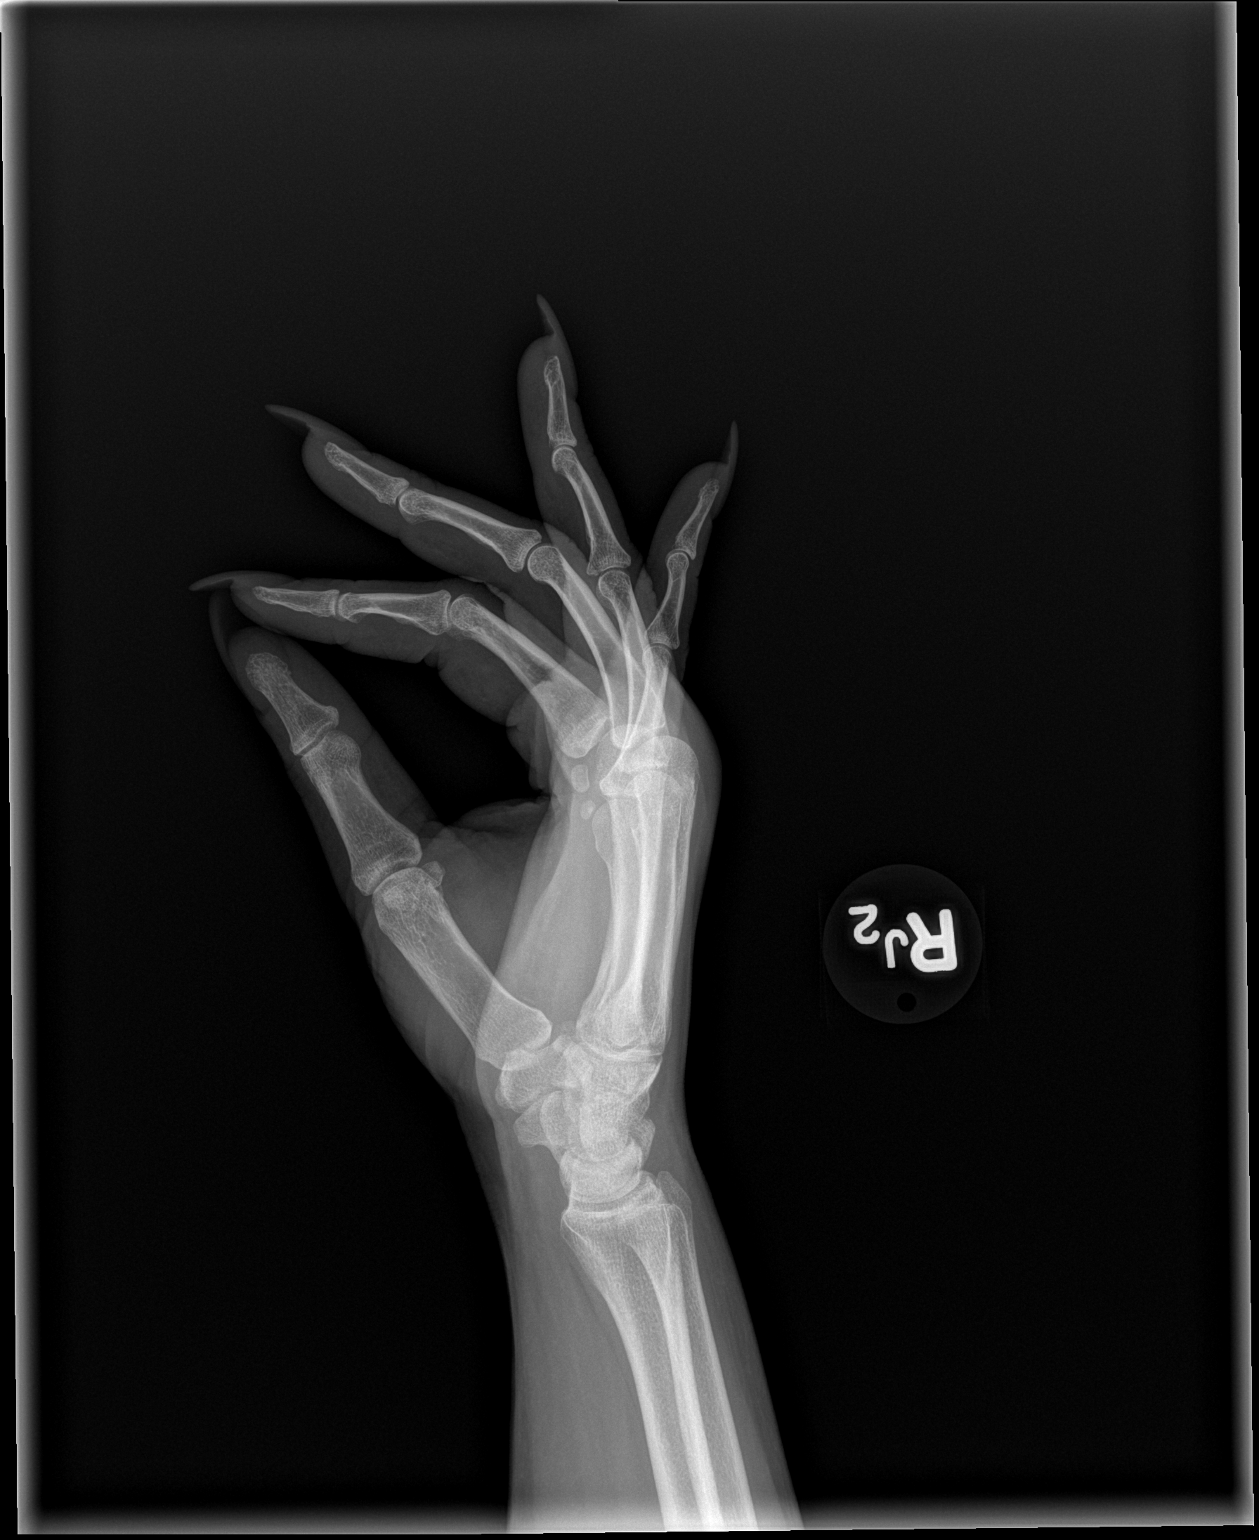

[3 of 3 positions shown; findings below may reference images not displayed]

FINDINGS: No acute fracture and no dislocation.  Minimal
degenerative changes in the digits.
IMPRESSION: No acute bony pathology.  Mild degenerative change.

## 2014-03-17 DIAGNOSIS — N952 Postmenopausal atrophic vaginitis: Secondary | ICD-10-CM | POA: Insufficient documentation

## 2014-03-17 DIAGNOSIS — R42 Dizziness and giddiness: Secondary | ICD-10-CM | POA: Insufficient documentation

## 2014-03-17 DIAGNOSIS — F5101 Primary insomnia: Secondary | ICD-10-CM | POA: Insufficient documentation

## 2014-04-11 ENCOUNTER — Encounter (INDEPENDENT_AMBULATORY_CARE_PROVIDER_SITE_OTHER): Payer: Self-pay | Admitting: Surgery

## 2014-08-02 DIAGNOSIS — J309 Allergic rhinitis, unspecified: Secondary | ICD-10-CM | POA: Insufficient documentation

## 2014-08-03 ENCOUNTER — Ambulatory Visit: Payer: Self-pay | Admitting: Family Medicine

## 2014-08-09 DIAGNOSIS — J301 Allergic rhinitis due to pollen: Secondary | ICD-10-CM | POA: Diagnosis not present

## 2014-08-16 DIAGNOSIS — J3089 Other allergic rhinitis: Secondary | ICD-10-CM | POA: Diagnosis not present

## 2014-08-16 DIAGNOSIS — J301 Allergic rhinitis due to pollen: Secondary | ICD-10-CM | POA: Diagnosis not present

## 2014-08-19 DIAGNOSIS — J301 Allergic rhinitis due to pollen: Secondary | ICD-10-CM | POA: Diagnosis not present

## 2014-08-23 DIAGNOSIS — J301 Allergic rhinitis due to pollen: Secondary | ICD-10-CM | POA: Diagnosis not present

## 2014-08-26 DIAGNOSIS — J301 Allergic rhinitis due to pollen: Secondary | ICD-10-CM | POA: Diagnosis not present

## 2014-08-30 DIAGNOSIS — J301 Allergic rhinitis due to pollen: Secondary | ICD-10-CM | POA: Diagnosis not present

## 2014-09-01 DIAGNOSIS — J301 Allergic rhinitis due to pollen: Secondary | ICD-10-CM | POA: Diagnosis not present

## 2014-09-06 DIAGNOSIS — J301 Allergic rhinitis due to pollen: Secondary | ICD-10-CM | POA: Diagnosis not present

## 2014-09-13 DIAGNOSIS — J301 Allergic rhinitis due to pollen: Secondary | ICD-10-CM | POA: Diagnosis not present

## 2014-09-19 ENCOUNTER — Other Ambulatory Visit: Payer: Self-pay

## 2014-09-19 DIAGNOSIS — Z1231 Encounter for screening mammogram for malignant neoplasm of breast: Secondary | ICD-10-CM

## 2014-09-21 ENCOUNTER — Ambulatory Visit
Admission: RE | Admit: 2014-09-21 | Discharge: 2014-09-21 | Disposition: A | Discharge status: Home / Self Care | Payer: Medicare Other | Source: Ambulatory Visit

## 2014-09-21 DIAGNOSIS — Z1231 Encounter for screening mammogram for malignant neoplasm of breast: Secondary | ICD-10-CM

## 2014-09-23 DIAGNOSIS — R21 Rash and other nonspecific skin eruption: Secondary | ICD-10-CM | POA: Diagnosis not present

## 2014-09-23 DIAGNOSIS — J301 Allergic rhinitis due to pollen: Secondary | ICD-10-CM | POA: Diagnosis not present

## 2014-09-23 DIAGNOSIS — H1045 Other chronic allergic conjunctivitis: Secondary | ICD-10-CM | POA: Diagnosis not present

## 2014-09-28 DIAGNOSIS — J301 Allergic rhinitis due to pollen: Secondary | ICD-10-CM | POA: Diagnosis not present

## 2014-10-05 DIAGNOSIS — J3089 Other allergic rhinitis: Secondary | ICD-10-CM | POA: Diagnosis not present

## 2014-10-05 DIAGNOSIS — J301 Allergic rhinitis due to pollen: Secondary | ICD-10-CM | POA: Diagnosis not present

## 2014-10-12 DIAGNOSIS — J3089 Other allergic rhinitis: Secondary | ICD-10-CM | POA: Diagnosis not present

## 2014-10-12 DIAGNOSIS — J301 Allergic rhinitis due to pollen: Secondary | ICD-10-CM | POA: Diagnosis not present

## 2014-10-19 ENCOUNTER — Ambulatory Visit (INDEPENDENT_AMBULATORY_CARE_PROVIDER_SITE_OTHER): Payer: Medicare Other | Admitting: Family Medicine

## 2014-10-19 ENCOUNTER — Encounter: Payer: Self-pay | Admitting: Family Medicine

## 2014-10-19 DIAGNOSIS — I10 Essential (primary) hypertension: Secondary | ICD-10-CM | POA: Insufficient documentation

## 2014-10-19 DIAGNOSIS — L732 Hidradenitis suppurativa: Secondary | ICD-10-CM

## 2014-10-19 DIAGNOSIS — M1990 Unspecified osteoarthritis, unspecified site: Secondary | ICD-10-CM | POA: Diagnosis not present

## 2014-10-19 DIAGNOSIS — Z7689 Persons encountering health services in other specified circumstances: Secondary | ICD-10-CM

## 2014-10-19 DIAGNOSIS — Z7189 Other specified counseling: Secondary | ICD-10-CM

## 2014-10-19 DIAGNOSIS — R946 Abnormal results of thyroid function studies: Secondary | ICD-10-CM | POA: Diagnosis not present

## 2014-10-19 DIAGNOSIS — J309 Allergic rhinitis, unspecified: Secondary | ICD-10-CM

## 2014-10-19 DIAGNOSIS — J301 Allergic rhinitis due to pollen: Secondary | ICD-10-CM | POA: Diagnosis not present

## 2014-10-19 DIAGNOSIS — D649 Anemia, unspecified: Secondary | ICD-10-CM

## 2014-10-19 DIAGNOSIS — R7989 Other specified abnormal findings of blood chemistry: Secondary | ICD-10-CM

## 2014-10-19 LAB — LIPID PANEL
CHOLESTEROL: 170 mg/dL (ref 0–200)
HDL: 57.8 mg/dL (ref >39.00)
LDL CALC: 98 mg/dL (ref 0–99)
NonHDL: 112.2
Total CHOL/HDL Ratio: 3
Triglycerides: 73 mg/dL (ref 0.0–149.0)
VLDL: 14.6 mg/dL (ref 0.0–40.0)

## 2014-10-19 LAB — BASIC METABOLIC PANEL
BUN: 12 mg/dL (ref 6–23)
CALCIUM: 9.5 mg/dL (ref 8.4–10.5)
CO2: 27 mEq/L (ref 19–32)
Chloride: 103 mEq/L (ref 96–112)
Creatinine, Ser: 1.23 mg/dL — ABNORMAL HIGH (ref 0.40–1.20)
GFR: 58.82 mL/min — AB (ref >60.00)
Glucose, Bld: 92 mg/dL (ref 70–99)
POTASSIUM: 4.2 meq/L (ref 3.5–5.1)
SODIUM: 137 meq/L (ref 135–145)

## 2014-10-19 LAB — HEMOGLOBIN A1C: Hgb A1c MFr Bld: 5.9 % (ref 4.6–6.5)

## 2014-10-19 LAB — TSH: TSH: 2.91 u[IU]/mL (ref 0.35–4.50)

## 2014-10-19 NOTE — Progress Notes (Signed)
HPI:  Elizabeth Griffin is here to establish care. She is here to establish care - used to go to [place]. Last PCP and physical:  Sees Dr. Raphael Gibney yearly - reports she had TSH check with gyn and slightly off.  Has the following chronic problems that require follow up and concerns today:  Allergic rhinitis: -has bad seasonal allergies - sees Dr. Donneta Romberg -has felt a little worse the last few days with nasal congestion, bilateral itchy watery eyes, mild headache -she also see Dr Clotilde Dieter in ENT for intermittent vertigo -she is on allergy shots -denies: fevers, NVD, SOB  HTN: -meds: norvasc 5 md daily -denies: CP, SOB, DOE  Rheumatoid Arthritis: -sees Dr. Trudie Reed in rheumatology -takes low dose methotrexate -reports getting labs in a few weeks  Hidradenitis: -severe, on disability for this  -sees a Psychologist, sport and exercise, has had multiple surgeris -has had multiple surgeries for this -doing better, on cleocin  Anemia: -sees hematologist -chrornic, sickle cell trait, she takes a prenatal vitamin and iron therapy orally -denies: palpitations, recent low blood counts  GERD: -takes protonix daily -she does very poorly if does not take this medication -denies hx hiatal hernia or stricutre  ROS negative for unless reported above: fevers, unintentional weight loss, hearing or vision loss, chest pain, palpitations, struggling to breath, hemoptysis, melena, hematochezia, hematuria, falls, loc, si, thoughts of self harm  Past Medical History  Diagnosis Date  . GERD (gastroesophageal reflux disease)   . Anemia     sickle cell trait, saw hematology  . Anxiety   . Hydradenitis     sees Dr. Ninfa Linden, s/p multiple surgeries, on disability  . Vitamin D deficiency   . BV (bacterial vaginosis)   . Endometrial mass     benign  . Diverticulosis   . Arthritis     sees Dr. Kimberlee Nearing, ? RA  . Hypertension   . Allergic rhinitis     sees allergist  . Obesity     Past Surgical History  Procedure  Laterality Date  . Cholecystectomy    . Axillary hidradenitis excision      right - 04/22/2006; left - 01/02/2009  . Perineal hidradenitis excision  01/23/2011    right buttock  . Hysteroscopy w/d&c  07/15/2008  . Hysteroscopy w/ endometrial ablation  11/23/2009    and D & C  . Hydradenitis excision  07/31/2011    Procedure: EXCISION HYDRADENITIS GROIN;  Surgeon: Harl Bowie, MD;  Location: [place];  Service: General;  Laterality: Right;    Family History  Problem Relation Age of Onset  . Hypertension Brother   . Cancer Son     leukemia    History   Social History  . Marital Status: Widowed    Spouse Name: N/A  . Number of Children: N/A  . Years of Education: N/A   Social History Main Topics  . Smoking status: Never Smoker   . Smokeless tobacco: Never Used  . Alcohol Use: No  . Drug Use: No  . Sexual Activity: Not Currently    Birth Control/ Protection: Abstinence   Other Topics Concern  . 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  amLODipine (NORVASC) 5 MG tablet, TAKE 1 TABLET BY MOUTH EVERY DAY, Disp: , Rfl:  .  clindamycin (CLEOCIN T) 1 % lotion, Apply topically 2 (two) times daily., Disp: , Rfl:  .  ferrous sulfate 325 (65 FE) MG tablet, Take 325 mg by mouth daily with breakfast., Disp: , Rfl:  .  methotrexate (RHEUMATREX) 2.5 MG tablet, Take by mouth., Disp: , Rfl:  .  Multiple Vitamin (MULTIVITAMIN) tablet, Take 1 tablet by mouth daily., Disp: , Rfl:  .  pantoprazole (PROTONIX) 40 MG tablet, TAKE 1 TABLET BY MOUTH EVERY DAY, Disp: , Rfl:  .  diphenhydrAMINE (BENADRYL) 25 MG tablet, Take 50 mg by mouth at bedtime., Disp: , Rfl:   EXAM:  Filed Vitals:   10/19/14 0928  BP: 124/84   Pulse: 85  Temp: 98.1 F (36.7 C)    Body mass index is 50.34 kg/(m^2).  GENERAL: vitals reviewed and listed above, alert, oriented, appears well hydrated and in no acute distress  HEENT: atraumatic, conjunttiva clear, no obvious abnormalities on inspection of external nose and ears  NECK: no obvious masses on inspection  LUNGS: clear to auscultation bilaterally, no wheezes, rales or rhonchi, good air movement  CV: HRRR, no peripheral edema  MS: moves all extremities without noticeable abnormality  PSYCH: pleasant and cooperative, no obvious depression or anxiety  ASSESSMENT AND PLAN:  Discussed the following assessment and plan:  Morbid obesity - Plan: Lipid Panel, TSH, Hemoglobin A1c  Abnormal thyroid blood test - Plan: TSH  Allergic rhinitis, unspecified allergic rhinitis type  Arthritis  HIDRADENITIS SUPPURATIVA  Essential hypertension - Plan: Basic metabolic panel  Anemia, unspecified anemia type  Encounter to establish care   -We reviewed the PMH, PSH, FH, SH, Meds and Allergies. -We provided refills for any medications we will prescribe as needed. -We addressed current concerns per orders and patient instructions. -We have asked for records for pertinent exams, studies, vaccines and notes from previous providers. -We have advised patient to follow up per instructions below.   -Patient advised to return or notify a doctor immediately if symptoms worsen or persist or new concerns arise.  Patient Instructions  BEFORE YOU LEAVE: -labs -follow up appointment in 3 months  Please have your specialist fax their notes to [place] when you see them. Thank you!  We recommend the following healthy lifestyle measures: - eat a healthy diet consisting of lots of vegetables, fruits, beans, nuts, seeds, healthy meats such as white chicken and fish and whole grains.  - avoid starches, sweets, fried foods, fast food, processed foods, sodas, red meet and other fattening  foods.  - get a least 150 minutes of aerobic exercise per week.   -We have ordered labs or studies at this visit. It can take up to 1-2 weeks for results and processing. We will contact you with instructions IF your results are abnormal. Normal results will be released to your [facility]. If you have not heard from [place] or can not find your results in [facility] in 2 weeks please contact our office.              Colin Benton R.

## 2014-10-19 NOTE — Progress Notes (Signed)
Pre visit review using our clinic review tool, if applicable. No additional management support is needed unless otherwise documented below in the visit note. 

## 2014-10-19 NOTE — Patient Instructions (Signed)
BEFORE YOU LEAVE: -labs -follow up appointment in 3 months  Please have your specialist fax their notes to [place] when you see them. Thank you!  We recommend the following healthy lifestyle measures: - eat a healthy diet consisting of lots of vegetables, fruits, beans, nuts, seeds, healthy meats such as white chicken and fish and whole grains.  - avoid starches, sweets, fried foods, fast food, processed foods, sodas, red meet and other fattening foods.  - get a least 150 minutes of aerobic exercise per week.   -We have ordered labs or studies at this visit. It can take up to 1-2 weeks for results and processing. We will contact you with instructions IF your results are abnormal. Normal results will be released to your [facility]. If you have not heard from [place] or can not find your results in [facility] in 2 weeks please contact our office.

## 2014-10-21 ENCOUNTER — Telehealth: Payer: Self-pay | Admitting: *Deleted

## 2014-10-21 NOTE — Telephone Encounter (Signed)
Patient called back wanting to know what her thyroid test and hemoglobin results were.  I advised the pt of the results for the TSH and advised her there was not a hemoglobin (iron level) checked on this date and she agreed.

## 2014-10-26 DIAGNOSIS — E559 Vitamin D deficiency, unspecified: Secondary | ICD-10-CM | POA: Diagnosis not present

## 2014-10-27 DIAGNOSIS — J301 Allergic rhinitis due to pollen: Secondary | ICD-10-CM | POA: Diagnosis not present

## 2014-11-01 DIAGNOSIS — M064 Inflammatory polyarthropathy: Secondary | ICD-10-CM | POA: Diagnosis not present

## 2014-11-01 DIAGNOSIS — M2550 Pain in unspecified joint: Secondary | ICD-10-CM | POA: Diagnosis not present

## 2014-11-01 DIAGNOSIS — M79673 Pain in unspecified foot: Secondary | ICD-10-CM | POA: Diagnosis not present

## 2014-11-01 DIAGNOSIS — L732 Hidradenitis suppurativa: Secondary | ICD-10-CM | POA: Diagnosis not present

## 2014-11-02 DIAGNOSIS — M722 Plantar fascial fibromatosis: Secondary | ICD-10-CM | POA: Diagnosis not present

## 2014-11-02 DIAGNOSIS — M19071 Primary osteoarthritis, right ankle and foot: Secondary | ICD-10-CM | POA: Diagnosis not present

## 2014-11-04 DIAGNOSIS — J301 Allergic rhinitis due to pollen: Secondary | ICD-10-CM | POA: Diagnosis not present

## 2014-11-04 DIAGNOSIS — J3089 Other allergic rhinitis: Secondary | ICD-10-CM | POA: Diagnosis not present

## 2014-11-10 DIAGNOSIS — J301 Allergic rhinitis due to pollen: Secondary | ICD-10-CM | POA: Diagnosis not present

## 2014-11-16 DIAGNOSIS — M722 Plantar fascial fibromatosis: Secondary | ICD-10-CM | POA: Diagnosis not present

## 2014-11-18 DIAGNOSIS — J301 Allergic rhinitis due to pollen: Secondary | ICD-10-CM | POA: Diagnosis not present

## 2014-11-25 DIAGNOSIS — J301 Allergic rhinitis due to pollen: Secondary | ICD-10-CM | POA: Diagnosis not present

## 2014-11-25 DIAGNOSIS — J3089 Other allergic rhinitis: Secondary | ICD-10-CM | POA: Diagnosis not present

## 2014-12-02 DIAGNOSIS — J3089 Other allergic rhinitis: Secondary | ICD-10-CM | POA: Diagnosis not present

## 2014-12-02 DIAGNOSIS — J301 Allergic rhinitis due to pollen: Secondary | ICD-10-CM | POA: Diagnosis not present

## 2014-12-08 DIAGNOSIS — J301 Allergic rhinitis due to pollen: Secondary | ICD-10-CM | POA: Diagnosis not present

## 2014-12-15 DIAGNOSIS — M722 Plantar fascial fibromatosis: Secondary | ICD-10-CM | POA: Diagnosis not present
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
## 2014-12-16 DIAGNOSIS — J301 Allergic rhinitis due to pollen: Secondary | ICD-10-CM | POA: Diagnosis not present

## 2014-12-23 DIAGNOSIS — J301 Allergic rhinitis due to pollen: Secondary | ICD-10-CM | POA: Diagnosis not present

## 2014-12-23 DIAGNOSIS — J3089 Other allergic rhinitis: Secondary | ICD-10-CM | POA: Diagnosis not present

## 2015-01-06 DIAGNOSIS — J301 Allergic rhinitis due to pollen: Secondary | ICD-10-CM | POA: Diagnosis not present

## 2015-01-12 DIAGNOSIS — J301 Allergic rhinitis due to pollen: Secondary | ICD-10-CM | POA: Diagnosis not present

## 2015-01-20 ENCOUNTER — Ambulatory Visit: Payer: Medicare Other | Admitting: Family Medicine

## 2015-01-25 ENCOUNTER — Ambulatory Visit (INDEPENDENT_AMBULATORY_CARE_PROVIDER_SITE_OTHER): Payer: Medicare Other | Admitting: Family Medicine

## 2015-01-25 ENCOUNTER — Encounter: Payer: Self-pay | Admitting: Family Medicine

## 2015-01-25 VITALS — BP 130/82 | HR 88 | Temp 98.4°F | Ht 63.0 in | Wt 282.6 lb

## 2015-01-25 DIAGNOSIS — E669 Obesity, unspecified: Secondary | ICD-10-CM | POA: Diagnosis not present

## 2015-01-25 DIAGNOSIS — I10 Essential (primary) hypertension: Secondary | ICD-10-CM

## 2015-01-25 DIAGNOSIS — N184 Chronic kidney disease, stage 4 (severe): Secondary | ICD-10-CM

## 2015-01-25 DIAGNOSIS — K219 Gastro-esophageal reflux disease without esophagitis: Secondary | ICD-10-CM

## 2015-01-25 DIAGNOSIS — J301 Allergic rhinitis due to pollen: Secondary | ICD-10-CM | POA: Diagnosis not present

## 2015-01-25 DIAGNOSIS — I011 Acute rheumatic endocarditis: Secondary | ICD-10-CM | POA: Insufficient documentation

## 2015-01-25 DIAGNOSIS — M064 Inflammatory polyarthropathy: Secondary | ICD-10-CM

## 2015-01-25 MED ORDER — AMLODIPINE BESYLATE 5 MG PO TABS: 5.0000 mg | ORAL_TABLET | Freq: Every day | ORAL | Status: DC

## 2015-01-25 MED ORDER — PANTOPRAZOLE SODIUM 40 MG PO TBEC: 40.0000 mg | DELAYED_RELEASE_TABLET | Freq: Every day | ORAL | Status: AC

## 2015-01-25 NOTE — Progress Notes (Signed)
Pre visit review using our clinic review tool, if applicable. No additional management support is needed unless otherwise documented below in the visit note. 

## 2015-01-25 NOTE — Progress Notes (Signed)
HPI:  Allergic rhinitis: -has bad seasonal allergies - sees Dr. Donneta Romberg -she also see Dr Clotilde Dieter in ENT for intermittent vertigo -she is on allergy shots -denies: fevers, NVD, SOB  HTN/Obesity: -meds: norvasc 5 md daily -trying to do water aerobics 3 times per week -denies: CP, SOB, DOE  CKD: -present for several years at least prior to her initial visit with me -she was not aware of this, improving last check -she will have labs in a few weeks there  Rheumatoid Arthritis: -sees Dr. Trudie Reed in rheumatology, has appt in a few weeks -takes low dose methotrexate -reports getting labs in a few weeks  Hidradenitis: -severe, on disability for this  -sees a Psychologist, sport and exercise, has had multiple surgeris -has had multiple surgeries for this -doing better, on cleocin  Anemia: -sees hematologist -chrornic, sickle cell trait, she takes a prenatal vitamin and iron therapy orally -denies: palpitations, recent low blood counts  GERD: -takes protonix daily -she does very poorly if does not take this medication -she has a hx of hiatal hernia -denies: dysphagia, chest pain  ROS: See pertinent positives and negatives per HPI.  Past Medical History  Diagnosis Date  . GERD (gastroesophageal reflux disease)   . Anemia     sickle cell trait, saw hematology  . Anxiety   . Hydradenitis     sees Dr. Ninfa Linden, s/p multiple surgeries, on disability  . Vitamin D deficiency   . BV (bacterial vaginosis)   . Endometrial mass     benign  . Diverticulosis   . Rheumatoid arthritis     sees Dr. Trudie Reed  . Hypertension   . Allergic rhinitis     sees allergist  . Obesity   . CKD (chronic kidney disease)     Past Surgical History  Procedure Laterality Date  . Cholecystectomy    . Axillary hidradenitis excision      right - 04/22/2006; left - 01/02/2009  . Perineal hidradenitis excision  01/23/2011    right buttock  . Hysteroscopy w/d&c  07/15/2008  . Hysteroscopy w/ endometrial ablation  11/23/2009     and D & C  . Hydradenitis excision  07/31/2011    Procedure: EXCISION HYDRADENITIS GROIN;  Surgeon: Harl Bowie, MD;  Location: [place];  Service: General;  Laterality: Right;    Family History  Problem Relation Age of Onset  . Hypertension Brother   . Cancer Son     leukemia    Social History   Social History  . Marital Status: Widowed    Spouse Name: N/A  . Number of Children: N/A  . Years of Education: N/A   Social History Main Topics  . Smoking status: Never Smoker   . Smokeless tobacco: Never Used  . Alcohol Use: No  . Drug Use: No  . Sexual Activity: Not Currently    Birth Control/ Protection: Abstinence   Other Topics Concern  . 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  amLODipine (NORVASC) 5 MG tablet, Take 1 tablet (5 mg total) by mouth daily. TAKE 1 TABLET BY MOUTH EVERY DAY, Disp: 90 tablet, Rfl: 3 .  clindamycin (CLEOCIN T) 1 % lotion, Apply topically 2 (two) times daily., Disp: , Rfl:  .  diphenhydrAMINE (BENADRYL) 25 MG tablet, Take 50 mg by mouth at bedtime., Disp: , Rfl:  .  ferrous sulfate 325 (65 FE) MG tablet, Take 325 mg by mouth daily with breakfast., Disp: , Rfl:  .  FOLIC ACID PO, Take by mouth., Disp: , Rfl:  .  methotrexate (RHEUMATREX) 2.5 MG tablet, Take by mouth., Disp: , Rfl:  .  pantoprazole (PROTONIX) 40 MG tablet, Take 1 tablet (40 mg total) by mouth daily. TAKE 1 TABLET BY MOUTH EVERY DAY, Disp: 90 tablet, Rfl: 3  EXAM:  Filed Vitals:   01/25/15 1027  BP: 130/82  Pulse: 88  Temp: 98.4 F (36.9 C)    Body mass index is 50.07 kg/(m^2).  GENERAL: vitals reviewed and listed above, alert, oriented, appears well hydrated and in no acute  distress  HEENT: atraumatic, conjunttiva clear, no obvious abnormalities on inspection of external nose and ears  NECK: no obvious masses on inspection  LUNGS: clear to auscultation bilaterally, no wheezes, rales or rhonchi, good air movement  CV: HRRR, no peripheral edema  MS: moves all extremities without noticeable abnormality  PSYCH: pleasant and cooperative, no obvious depression or anxiety  ASSESSMENT AND PLAN:  Discussed the following assessment and plan:  Gastroesophageal reflux disease, esophagitis presence not specified - Plan: pantoprazole (PROTONIX) 40 MG tablet -discussed risks with daily higher dose PPI, she has hx of bad acid reflux and hiatal hernia and is very symptomatic wihtout -did discuss trial lower dose and/or zantac which she will consider  Essential hypertension - Plan: amLODipine (NORVASC) 5 MG tablet  Obesity -lifestyle recs  Chronic kidney disease (CKD), stage IV (severe) -discussed, having labs with rheumatologist  Rheumatoid aortitis -sees Dr. Gertie Fey  -Patient advised to return or notify a doctor immediately if symptoms worsen or persist or new concerns arise.  Patient Instructions  Check labs with your rhematologist  We recommend the following healthy lifestyle measures: - eat a healthy diet consisting of small portions of lots of vegetables, fruits, beans, nuts, seeds, healthy meats such as white chicken and fish  - avoid fried foods, fast food, processed foods, sodas, red meet and other fattening foods.  - get a least 150 minutes of aerobic exercise per week.       Colin Benton R.

## 2015-01-25 NOTE — Patient Instructions (Addendum)
BEFORE YOU LEAVE: -follow up in 6 months  Check labs with your rhematologist  We recommend the following healthy lifestyle measures: - eat a healthy diet consisting of small portions of lots of vegetables, fruits, beans, nuts, seeds, healthy meats such as white chicken and fish  - avoid fried foods, fast food, processed foods, sodas, red meet and other fattening foods.  - get a least 150 minutes of aerobic exercise per week.

## 2015-02-10 DIAGNOSIS — J301 Allergic rhinitis due to pollen: Secondary | ICD-10-CM | POA: Diagnosis not present

## 2015-02-15 DIAGNOSIS — M2550 Pain in unspecified joint: Secondary | ICD-10-CM | POA: Diagnosis not present

## 2015-02-15 DIAGNOSIS — M79671 Pain in right foot: Secondary | ICD-10-CM | POA: Diagnosis not present

## 2015-02-15 DIAGNOSIS — M064 Inflammatory polyarthropathy: Secondary | ICD-10-CM | POA: Diagnosis not present

## 2015-02-15 DIAGNOSIS — L732 Hidradenitis suppurativa: Secondary | ICD-10-CM | POA: Diagnosis not present

## 2015-02-15 DIAGNOSIS — Z79899 Other long term (current) drug therapy: Secondary | ICD-10-CM | POA: Diagnosis not present

## 2015-02-17 DIAGNOSIS — J301 Allergic rhinitis due to pollen: Secondary | ICD-10-CM | POA: Diagnosis not present

## 2015-02-17 DIAGNOSIS — R21 Rash and other nonspecific skin eruption: Secondary | ICD-10-CM | POA: Diagnosis not present

## 2015-02-17 DIAGNOSIS — H1045 Other chronic allergic conjunctivitis: Secondary | ICD-10-CM | POA: Diagnosis not present

## 2015-02-21 DIAGNOSIS — R6882 Decreased libido: Secondary | ICD-10-CM | POA: Diagnosis not present

## 2015-02-21 DIAGNOSIS — N941 Dyspareunia: Secondary | ICD-10-CM | POA: Diagnosis not present

## 2015-02-21 DIAGNOSIS — J301 Allergic rhinitis due to pollen: Secondary | ICD-10-CM | POA: Diagnosis not present

## 2015-02-21 DIAGNOSIS — R635 Abnormal weight gain: Secondary | ICD-10-CM | POA: Diagnosis not present

## 2015-02-27 DIAGNOSIS — J301 Allergic rhinitis due to pollen: Secondary | ICD-10-CM | POA: Diagnosis not present

## 2015-03-02 ENCOUNTER — Encounter (INDEPENDENT_AMBULATORY_CARE_PROVIDER_SITE_OTHER): Payer: Medicare Other | Admitting: Podiatry

## 2015-03-02 ENCOUNTER — Ambulatory Visit: Payer: Self-pay

## 2015-03-02 DIAGNOSIS — M722 Plantar fascial fibromatosis: Secondary | ICD-10-CM

## 2015-03-02 NOTE — Progress Notes (Signed)
This encounter was created in error - please disregard.

## 2015-03-06 DIAGNOSIS — J301 Allergic rhinitis due to pollen: Secondary | ICD-10-CM | POA: Diagnosis not present

## 2015-03-13 DIAGNOSIS — J301 Allergic rhinitis due to pollen: Secondary | ICD-10-CM | POA: Diagnosis not present

## 2015-03-20 ENCOUNTER — Telehealth: Payer: Self-pay | Admitting: Family Medicine

## 2015-03-20 NOTE — Telephone Encounter (Signed)
[id number] - [place], [place] [phone]  Requesting refill of ferrous sulfate 325 (65 FE) MG tablet

## 2015-03-20 NOTE — Telephone Encounter (Signed)
Can purchase OTC.  He note say she saw a hematologist for this. Can you ask the name of th hematologist so that we can get records? On review of labs with rheum she remains anemic.

## 2015-03-21 ENCOUNTER — Telehealth: Payer: Self-pay | Admitting: Family Medicine

## 2015-03-21 DIAGNOSIS — J301 Allergic rhinitis due to pollen: Secondary | ICD-10-CM | POA: Diagnosis not present

## 2015-03-21 DIAGNOSIS — Z23 Encounter for immunization: Secondary | ICD-10-CM | POA: Diagnosis not present

## 2015-03-21 NOTE — Telephone Encounter (Signed)
Ok to refill x3 months. I do not see any hematology records in the chart - please obtain consent from pt and obtain these records. Thanks.

## 2015-03-21 NOTE — Telephone Encounter (Signed)
Dr Maudie Mercury- I called the pt and informed her of the message below and she stated the OTC iron does not work for her and she was advised to take this medication per a Dr Windell Norfolk at [facility] and she is not longer there and she thought we had this in our records?  I did not see this in her chart and she stated she is almost out of this.

## 2015-03-21 NOTE — Telephone Encounter (Signed)
Pt request refill of the following: ferrous sulfate 325 (65 FE) MG tablet   Phamacy:  [address]

## 2015-03-22 ENCOUNTER — Other Ambulatory Visit: Payer: Self-pay

## 2015-03-22 MED ORDER — FERROUS SULFATE 325 (65 FE) MG PO TABS: 325.0000 mg | ORAL_TABLET | Freq: Every day | ORAL | Status: DC

## 2015-03-22 NOTE — Addendum Note (Signed)
Addended by: Agnes Lawrence on: 03/22/2015 12:35 PM   Modules accepted: Orders

## 2015-03-22 NOTE — Telephone Encounter (Signed)
See prior note

## 2015-03-22 NOTE — Telephone Encounter (Signed)
Rx done and the pt was informed of this.  She states Dr Kerin Ransom referred her to Dr Julien Nordmann in [place] and I informed her I will try to contact his office.

## 2015-03-23 DIAGNOSIS — J301 Allergic rhinitis due to pollen: Secondary | ICD-10-CM | POA: Diagnosis not present

## 2015-03-30 DIAGNOSIS — J301 Allergic rhinitis due to pollen: Secondary | ICD-10-CM | POA: Diagnosis not present

## 2015-04-06 DIAGNOSIS — J301 Allergic rhinitis due to pollen: Secondary | ICD-10-CM | POA: Diagnosis not present

## 2015-04-11 DIAGNOSIS — J301 Allergic rhinitis due to pollen: Secondary | ICD-10-CM | POA: Diagnosis not present

## 2015-04-19 ENCOUNTER — Other Ambulatory Visit: Payer: Self-pay

## 2015-04-19 DIAGNOSIS — J301 Allergic rhinitis due to pollen: Secondary | ICD-10-CM | POA: Diagnosis not present

## 2015-04-21 DIAGNOSIS — J301 Allergic rhinitis due to pollen: Secondary | ICD-10-CM | POA: Diagnosis not present

## 2015-04-25 DIAGNOSIS — J301 Allergic rhinitis due to pollen: Secondary | ICD-10-CM | POA: Diagnosis not present

## 2015-05-02 DIAGNOSIS — J301 Allergic rhinitis due to pollen: Secondary | ICD-10-CM | POA: Diagnosis not present

## 2015-05-09 DIAGNOSIS — J301 Allergic rhinitis due to pollen: Secondary | ICD-10-CM | POA: Diagnosis not present

## 2015-05-17 DIAGNOSIS — M064 Inflammatory polyarthropathy: Secondary | ICD-10-CM | POA: Diagnosis not present

## 2015-05-17 DIAGNOSIS — J301 Allergic rhinitis due to pollen: Secondary | ICD-10-CM | POA: Diagnosis not present

## 2015-05-17 DIAGNOSIS — Z79899 Other long term (current) drug therapy: Secondary | ICD-10-CM | POA: Diagnosis not present

## 2015-05-17 DIAGNOSIS — L732 Hidradenitis suppurativa: Secondary | ICD-10-CM | POA: Diagnosis not present

## 2015-05-17 DIAGNOSIS — M79671 Pain in right foot: Secondary | ICD-10-CM | POA: Diagnosis not present

## 2015-06-01 DIAGNOSIS — J301 Allergic rhinitis due to pollen: Secondary | ICD-10-CM | POA: Diagnosis not present

## 2015-07-07 DIAGNOSIS — Z131 Encounter for screening for diabetes mellitus: Secondary | ICD-10-CM | POA: Diagnosis not present

## 2015-07-07 DIAGNOSIS — F419 Anxiety disorder, unspecified: Secondary | ICD-10-CM | POA: Diagnosis not present

## 2015-07-07 DIAGNOSIS — Z7689 Persons encountering health services in other specified circumstances: Secondary | ICD-10-CM | POA: Diagnosis not present

## 2015-07-07 DIAGNOSIS — I10 Essential (primary) hypertension: Secondary | ICD-10-CM | POA: Diagnosis not present

## 2015-07-07 DIAGNOSIS — E538 Deficiency of other specified B group vitamins: Secondary | ICD-10-CM | POA: Diagnosis not present

## 2015-07-07 DIAGNOSIS — E559 Vitamin D deficiency, unspecified: Secondary | ICD-10-CM | POA: Diagnosis not present

## 2015-07-21 DIAGNOSIS — E559 Vitamin D deficiency, unspecified: Secondary | ICD-10-CM | POA: Diagnosis not present

## 2015-07-21 DIAGNOSIS — I10 Essential (primary) hypertension: Secondary | ICD-10-CM | POA: Diagnosis not present

## 2015-07-21 DIAGNOSIS — F419 Anxiety disorder, unspecified: Secondary | ICD-10-CM | POA: Diagnosis not present

## 2015-07-21 DIAGNOSIS — R635 Abnormal weight gain: Secondary | ICD-10-CM | POA: Diagnosis not present

## 2015-07-25 DIAGNOSIS — N771 Vaginitis, vulvitis and vulvovaginitis in diseases classified elsewhere: Secondary | ICD-10-CM | POA: Diagnosis not present

## 2015-07-25 DIAGNOSIS — Z01419 Encounter for gynecological examination (general) (routine) without abnormal findings: Secondary | ICD-10-CM | POA: Diagnosis not present

## 2015-07-25 DIAGNOSIS — N7689 Other specified inflammation of vagina and vulva: Secondary | ICD-10-CM | POA: Diagnosis not present

## 2015-07-27 ENCOUNTER — Ambulatory Visit: Payer: Medicare Other | Admitting: Family Medicine

## 2015-08-09 ENCOUNTER — Other Ambulatory Visit: Payer: Self-pay | Admitting: Family Medicine

## 2015-08-09 NOTE — Telephone Encounter (Signed)
A refill for the pts iron supplement came from the pharmacy.  I called the pharmacy and spoke with the pharmacist and he stated he notes a Dr Franne Forts on her profile.  I called the pt and she stated she is not sure why this was sent to [place] as Dr Smith Mince is now her PCP and she will contact them for a refill.

## 2015-08-15 DIAGNOSIS — Z79899 Other long term (current) drug therapy: Secondary | ICD-10-CM | POA: Diagnosis not present

## 2015-08-15 DIAGNOSIS — M064 Inflammatory polyarthropathy: Secondary | ICD-10-CM | POA: Diagnosis not present

## 2015-08-15 DIAGNOSIS — L732 Hidradenitis suppurativa: Secondary | ICD-10-CM | POA: Diagnosis not present

## 2015-08-15 DIAGNOSIS — M79671 Pain in right foot: Secondary | ICD-10-CM | POA: Diagnosis not present

## 2015-08-18 DIAGNOSIS — E039 Hypothyroidism, unspecified: Secondary | ICD-10-CM | POA: Diagnosis not present

## 2015-08-18 DIAGNOSIS — I10 Essential (primary) hypertension: Secondary | ICD-10-CM | POA: Diagnosis not present

## 2015-09-15 DIAGNOSIS — R6889 Other general symptoms and signs: Secondary | ICD-10-CM | POA: Diagnosis not present

## 2015-09-20 DIAGNOSIS — I10 Essential (primary) hypertension: Secondary | ICD-10-CM | POA: Diagnosis not present

## 2015-09-20 DIAGNOSIS — E039 Hypothyroidism, unspecified: Secondary | ICD-10-CM | POA: Diagnosis not present

## 2015-10-18 DIAGNOSIS — I10 Essential (primary) hypertension: Secondary | ICD-10-CM | POA: Diagnosis not present

## 2015-10-18 DIAGNOSIS — E039 Hypothyroidism, unspecified: Secondary | ICD-10-CM | POA: Diagnosis not present

## 2015-10-27 ENCOUNTER — Other Ambulatory Visit: Payer: Self-pay

## 2015-10-27 DIAGNOSIS — Z1231 Encounter for screening mammogram for malignant neoplasm of breast: Secondary | ICD-10-CM

## 2015-11-09 ENCOUNTER — Ambulatory Visit
Admission: RE | Admit: 2015-11-09 | Discharge: 2015-11-09 | Disposition: A | Discharge status: Home / Self Care | Payer: Medicare Other | Source: Ambulatory Visit

## 2015-11-09 DIAGNOSIS — Z1231 Encounter for screening mammogram for malignant neoplasm of breast: Secondary | ICD-10-CM

## 2015-11-14 DIAGNOSIS — R7303 Prediabetes: Secondary | ICD-10-CM | POA: Diagnosis not present

## 2015-11-14 DIAGNOSIS — E039 Hypothyroidism, unspecified: Secondary | ICD-10-CM | POA: Diagnosis not present

## 2015-11-14 DIAGNOSIS — I10 Essential (primary) hypertension: Secondary | ICD-10-CM | POA: Diagnosis not present

## 2015-11-15 DIAGNOSIS — M65312 Trigger thumb, left thumb: Secondary | ICD-10-CM | POA: Diagnosis not present

## 2015-11-15 DIAGNOSIS — M2550 Pain in unspecified joint: Secondary | ICD-10-CM | POA: Diagnosis not present

## 2015-11-15 DIAGNOSIS — L732 Hidradenitis suppurativa: Secondary | ICD-10-CM | POA: Diagnosis not present

## 2015-11-15 DIAGNOSIS — Z79899 Other long term (current) drug therapy: Secondary | ICD-10-CM | POA: Diagnosis not present

## 2015-11-15 DIAGNOSIS — M064 Inflammatory polyarthropathy: Secondary | ICD-10-CM | POA: Diagnosis not present

## 2015-11-30 DIAGNOSIS — H10013 Acute follicular conjunctivitis, bilateral: Secondary | ICD-10-CM | POA: Diagnosis not present

## 2015-11-30 DIAGNOSIS — H40033 Anatomical narrow angle, bilateral: Secondary | ICD-10-CM | POA: Diagnosis not present

## 2015-12-04 DIAGNOSIS — H10013 Acute follicular conjunctivitis, bilateral: Secondary | ICD-10-CM | POA: Diagnosis not present

## 2016-01-22 DIAGNOSIS — I10 Essential (primary) hypertension: Secondary | ICD-10-CM | POA: Diagnosis not present

## 2016-01-22 DIAGNOSIS — R7303 Prediabetes: Secondary | ICD-10-CM | POA: Diagnosis not present

## 2016-01-22 DIAGNOSIS — E039 Hypothyroidism, unspecified: Secondary | ICD-10-CM | POA: Diagnosis not present

## 2016-02-07 DIAGNOSIS — I10 Essential (primary) hypertension: Secondary | ICD-10-CM | POA: Diagnosis not present

## 2016-02-07 DIAGNOSIS — E039 Hypothyroidism, unspecified: Secondary | ICD-10-CM | POA: Diagnosis not present

## 2016-02-07 DIAGNOSIS — R7303 Prediabetes: Secondary | ICD-10-CM | POA: Diagnosis not present

## 2016-02-09 ENCOUNTER — Other Ambulatory Visit: Payer: Self-pay | Admitting: Family Medicine

## 2016-02-09 DIAGNOSIS — I10 Essential (primary) hypertension: Secondary | ICD-10-CM

## 2016-02-13 DIAGNOSIS — Z79899 Other long term (current) drug therapy: Secondary | ICD-10-CM | POA: Diagnosis not present

## 2016-02-13 DIAGNOSIS — M064 Inflammatory polyarthropathy: Secondary | ICD-10-CM | POA: Diagnosis not present

## 2016-02-13 DIAGNOSIS — L732 Hidradenitis suppurativa: Secondary | ICD-10-CM | POA: Diagnosis not present

## 2016-02-13 DIAGNOSIS — M65312 Trigger thumb, left thumb: Secondary | ICD-10-CM | POA: Diagnosis not present

## 2016-02-13 DIAGNOSIS — M2550 Pain in unspecified joint: Secondary | ICD-10-CM | POA: Diagnosis not present

## 2016-02-28 DIAGNOSIS — E039 Hypothyroidism, unspecified: Secondary | ICD-10-CM | POA: Diagnosis not present

## 2016-02-28 DIAGNOSIS — R7303 Prediabetes: Secondary | ICD-10-CM | POA: Diagnosis not present

## 2016-02-28 DIAGNOSIS — I10 Essential (primary) hypertension: Secondary | ICD-10-CM | POA: Diagnosis not present

## 2016-03-08 DIAGNOSIS — Z9189 Other specified personal risk factors, not elsewhere classified: Secondary | ICD-10-CM | POA: Diagnosis not present

## 2016-03-08 DIAGNOSIS — R0602 Shortness of breath: Secondary | ICD-10-CM | POA: Diagnosis not present

## 2016-03-08 DIAGNOSIS — N183 Chronic kidney disease, stage 3 (moderate): Secondary | ICD-10-CM | POA: Diagnosis not present

## 2016-03-08 DIAGNOSIS — E039 Hypothyroidism, unspecified: Secondary | ICD-10-CM | POA: Diagnosis not present

## 2016-03-08 DIAGNOSIS — R7303 Prediabetes: Secondary | ICD-10-CM | POA: Diagnosis not present

## 2016-03-11 DIAGNOSIS — G4733 Obstructive sleep apnea (adult) (pediatric): Secondary | ICD-10-CM | POA: Diagnosis not present

## 2016-03-18 ENCOUNTER — Other Ambulatory Visit: Payer: Self-pay | Admitting: Family Medicine

## 2016-03-18 DIAGNOSIS — K219 Gastro-esophageal reflux disease without esophagitis: Secondary | ICD-10-CM

## 2016-05-01 DIAGNOSIS — N183 Chronic kidney disease, stage 3 (moderate): Secondary | ICD-10-CM | POA: Diagnosis not present

## 2016-05-01 DIAGNOSIS — I10 Essential (primary) hypertension: Secondary | ICD-10-CM | POA: Diagnosis not present

## 2016-05-01 DIAGNOSIS — M069 Rheumatoid arthritis, unspecified: Secondary | ICD-10-CM | POA: Diagnosis not present

## 2016-05-06 ENCOUNTER — Other Ambulatory Visit: Payer: Self-pay | Admitting: Nephrology

## 2016-05-06 DIAGNOSIS — N183 Chronic kidney disease, stage 3 unspecified: Secondary | ICD-10-CM

## 2016-05-07 ENCOUNTER — Ambulatory Visit
Admission: RE | Admit: 2016-05-07 | Discharge: 2016-05-07 | Disposition: A | Discharge status: Home / Self Care | Payer: Medicare Other | Source: Ambulatory Visit | Attending: Nephrology | Admitting: Nephrology

## 2016-05-07 DIAGNOSIS — N183 Chronic kidney disease, stage 3 unspecified: Secondary | ICD-10-CM

## 2016-05-14 DIAGNOSIS — E039 Hypothyroidism, unspecified: Secondary | ICD-10-CM | POA: Diagnosis not present

## 2016-05-14 DIAGNOSIS — M2550 Pain in unspecified joint: Secondary | ICD-10-CM | POA: Diagnosis not present

## 2016-05-14 DIAGNOSIS — N183 Chronic kidney disease, stage 3 (moderate): Secondary | ICD-10-CM | POA: Diagnosis not present

## 2016-05-14 DIAGNOSIS — M064 Inflammatory polyarthropathy: Secondary | ICD-10-CM | POA: Diagnosis not present

## 2016-05-14 DIAGNOSIS — I10 Essential (primary) hypertension: Secondary | ICD-10-CM | POA: Diagnosis not present

## 2016-05-14 DIAGNOSIS — L732 Hidradenitis suppurativa: Secondary | ICD-10-CM | POA: Diagnosis not present

## 2016-05-14 DIAGNOSIS — Z79899 Other long term (current) drug therapy: Secondary | ICD-10-CM | POA: Diagnosis not present

## 2016-05-14 DIAGNOSIS — R7303 Prediabetes: Secondary | ICD-10-CM | POA: Diagnosis not present

## 2016-05-29 DIAGNOSIS — I10 Essential (primary) hypertension: Secondary | ICD-10-CM | POA: Diagnosis not present

## 2016-05-29 DIAGNOSIS — R7303 Prediabetes: Secondary | ICD-10-CM | POA: Diagnosis not present

## 2016-05-29 DIAGNOSIS — L732 Hidradenitis suppurativa: Secondary | ICD-10-CM | POA: Diagnosis not present

## 2016-05-29 DIAGNOSIS — E039 Hypothyroidism, unspecified: Secondary | ICD-10-CM | POA: Diagnosis not present

## 2016-05-29 DIAGNOSIS — N183 Chronic kidney disease, stage 3 (moderate): Secondary | ICD-10-CM | POA: Diagnosis not present

## 2016-07-24 DIAGNOSIS — E039 Hypothyroidism, unspecified: Secondary | ICD-10-CM | POA: Diagnosis not present

## 2016-07-24 DIAGNOSIS — I10 Essential (primary) hypertension: Secondary | ICD-10-CM | POA: Diagnosis not present

## 2016-07-24 DIAGNOSIS — N183 Chronic kidney disease, stage 3 (moderate): Secondary | ICD-10-CM | POA: Diagnosis not present

## 2016-07-24 DIAGNOSIS — R7303 Prediabetes: Secondary | ICD-10-CM | POA: Diagnosis not present

## 2016-07-26 DIAGNOSIS — E079 Disorder of thyroid, unspecified: Secondary | ICD-10-CM | POA: Insufficient documentation

## 2016-07-26 DIAGNOSIS — R7303 Prediabetes: Secondary | ICD-10-CM | POA: Insufficient documentation

## 2016-07-26 DIAGNOSIS — Z01419 Encounter for gynecological examination (general) (routine) without abnormal findings: Secondary | ICD-10-CM | POA: Diagnosis not present

## 2016-08-14 DIAGNOSIS — L732 Hidradenitis suppurativa: Secondary | ICD-10-CM | POA: Diagnosis not present

## 2016-08-14 DIAGNOSIS — Z79899 Other long term (current) drug therapy: Secondary | ICD-10-CM | POA: Diagnosis not present

## 2016-08-14 DIAGNOSIS — M2550 Pain in unspecified joint: Secondary | ICD-10-CM | POA: Diagnosis not present

## 2016-08-14 DIAGNOSIS — M064 Inflammatory polyarthropathy: Secondary | ICD-10-CM | POA: Diagnosis not present

## 2016-09-30 ENCOUNTER — Other Ambulatory Visit (HOSPITAL_BASED_OUTPATIENT_CLINIC_OR_DEPARTMENT_OTHER): Payer: Self-pay

## 2016-09-30 DIAGNOSIS — G4730 Sleep apnea, unspecified: Secondary | ICD-10-CM

## 2016-10-23 DIAGNOSIS — E039 Hypothyroidism, unspecified: Secondary | ICD-10-CM | POA: Diagnosis not present

## 2016-10-23 DIAGNOSIS — Z7689 Persons encountering health services in other specified circumstances: Secondary | ICD-10-CM | POA: Diagnosis not present

## 2016-10-23 DIAGNOSIS — R7303 Prediabetes: Secondary | ICD-10-CM | POA: Diagnosis not present

## 2016-10-23 DIAGNOSIS — N183 Chronic kidney disease, stage 3 (moderate): Secondary | ICD-10-CM | POA: Diagnosis not present

## 2016-10-31 ENCOUNTER — Other Ambulatory Visit: Payer: Self-pay | Admitting: Physician Assistant

## 2016-10-31 DIAGNOSIS — Z1231 Encounter for screening mammogram for malignant neoplasm of breast: Secondary | ICD-10-CM

## 2016-11-15 ENCOUNTER — Encounter (HOSPITAL_BASED_OUTPATIENT_CLINIC_OR_DEPARTMENT_OTHER): Payer: Medicare Other

## 2016-11-15 DIAGNOSIS — R7303 Prediabetes: Secondary | ICD-10-CM | POA: Diagnosis not present

## 2016-11-15 DIAGNOSIS — N183 Chronic kidney disease, stage 3 (moderate): Secondary | ICD-10-CM | POA: Diagnosis not present

## 2016-11-15 DIAGNOSIS — M069 Rheumatoid arthritis, unspecified: Secondary | ICD-10-CM | POA: Diagnosis not present

## 2016-11-15 DIAGNOSIS — E039 Hypothyroidism, unspecified: Secondary | ICD-10-CM | POA: Diagnosis not present

## 2016-11-20 ENCOUNTER — Ambulatory Visit: Payer: Medicare Other

## 2016-11-29 ENCOUNTER — Ambulatory Visit
Admission: RE | Admit: 2016-11-29 | Discharge: 2016-11-29 | Disposition: A | Discharge status: Home / Self Care | Payer: Medicare Other | Source: Ambulatory Visit | Attending: Physician Assistant | Admitting: Physician Assistant

## 2016-11-29 DIAGNOSIS — Z1231 Encounter for screening mammogram for malignant neoplasm of breast: Secondary | ICD-10-CM

## 2016-12-06 DIAGNOSIS — M064 Inflammatory polyarthropathy: Secondary | ICD-10-CM | POA: Diagnosis not present

## 2016-12-06 DIAGNOSIS — L732 Hidradenitis suppurativa: Secondary | ICD-10-CM | POA: Diagnosis not present

## 2016-12-06 DIAGNOSIS — Z79899 Other long term (current) drug therapy: Secondary | ICD-10-CM | POA: Diagnosis not present

## 2016-12-06 DIAGNOSIS — M2550 Pain in unspecified joint: Secondary | ICD-10-CM | POA: Diagnosis not present

## 2017-01-08 DIAGNOSIS — M064 Inflammatory polyarthropathy: Secondary | ICD-10-CM | POA: Diagnosis not present

## 2017-01-20 ENCOUNTER — Encounter (HOSPITAL_BASED_OUTPATIENT_CLINIC_OR_DEPARTMENT_OTHER): Payer: Medicare Other

## 2017-01-22 DIAGNOSIS — D509 Iron deficiency anemia, unspecified: Secondary | ICD-10-CM | POA: Diagnosis not present

## 2017-01-22 DIAGNOSIS — N183 Chronic kidney disease, stage 3 (moderate): Secondary | ICD-10-CM | POA: Diagnosis not present

## 2017-01-22 DIAGNOSIS — E039 Hypothyroidism, unspecified: Secondary | ICD-10-CM | POA: Diagnosis not present

## 2017-01-22 DIAGNOSIS — M069 Rheumatoid arthritis, unspecified: Secondary | ICD-10-CM | POA: Diagnosis not present

## 2017-01-22 DIAGNOSIS — R7303 Prediabetes: Secondary | ICD-10-CM | POA: Diagnosis not present

## 2017-02-01 ENCOUNTER — Emergency Department (HOSPITAL_COMMUNITY)
Admission: EM | Admit: 2017-02-01 | Discharge: 2017-02-01 | Disposition: A | Discharge status: Home / Self Care | Payer: Medicare Other | Attending: Emergency Medicine | Admitting: Emergency Medicine

## 2017-02-01 ENCOUNTER — Encounter (HOSPITAL_COMMUNITY): Payer: Self-pay | Admitting: Emergency Medicine

## 2017-02-01 DIAGNOSIS — J029 Acute pharyngitis, unspecified: Secondary | ICD-10-CM | POA: Diagnosis not present

## 2017-02-01 DIAGNOSIS — K219 Gastro-esophageal reflux disease without esophagitis: Secondary | ICD-10-CM | POA: Diagnosis not present

## 2017-02-01 DIAGNOSIS — R070 Pain in throat: Secondary | ICD-10-CM | POA: Diagnosis present

## 2017-02-01 DIAGNOSIS — Z5321 Procedure and treatment not carried out due to patient leaving prior to being seen by health care provider: Secondary | ICD-10-CM | POA: Diagnosis not present

## 2017-02-01 DIAGNOSIS — I10 Essential (primary) hypertension: Secondary | ICD-10-CM | POA: Diagnosis not present

## 2017-02-01 DIAGNOSIS — I00 Rheumatic fever without heart involvement: Secondary | ICD-10-CM | POA: Diagnosis not present

## 2017-02-01 LAB — RAPID STREP SCREEN (MED CTR MEBANE ONLY): Streptococcus, Group A Screen (Direct): NEGATIVE
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
## 2017-02-01 NOTE — ED Notes (Signed)
Called 3X to recheck vitals-no response

## 2017-02-01 NOTE — ED Notes (Signed)
Called in whole ED lobby with no answer and no visible pt.

## 2017-02-03 LAB — CULTURE, GROUP A STREP (THRC)

## 2017-02-06 DIAGNOSIS — R7989 Other specified abnormal findings of blood chemistry: Secondary | ICD-10-CM | POA: Diagnosis not present

## 2017-03-05 DIAGNOSIS — E538 Deficiency of other specified B group vitamins: Secondary | ICD-10-CM | POA: Diagnosis not present

## 2017-03-05 DIAGNOSIS — R05 Cough: Secondary | ICD-10-CM | POA: Diagnosis not present

## 2017-03-05 DIAGNOSIS — R7303 Prediabetes: Secondary | ICD-10-CM | POA: Diagnosis not present

## 2017-03-07 DIAGNOSIS — M2550 Pain in unspecified joint: Secondary | ICD-10-CM | POA: Diagnosis not present

## 2017-03-07 DIAGNOSIS — M064 Inflammatory polyarthropathy: Secondary | ICD-10-CM | POA: Diagnosis not present

## 2017-03-07 DIAGNOSIS — L732 Hidradenitis suppurativa: Secondary | ICD-10-CM | POA: Diagnosis not present

## 2017-03-10 DIAGNOSIS — H40033 Anatomical narrow angle, bilateral: Secondary | ICD-10-CM | POA: Diagnosis not present

## 2017-03-10 DIAGNOSIS — E119 Type 2 diabetes mellitus without complications: Secondary | ICD-10-CM | POA: Diagnosis not present

## 2017-03-14 ENCOUNTER — Encounter (HOSPITAL_BASED_OUTPATIENT_CLINIC_OR_DEPARTMENT_OTHER): Payer: Medicare Other

## 2017-03-19 DIAGNOSIS — R7303 Prediabetes: Secondary | ICD-10-CM | POA: Diagnosis not present

## 2017-03-19 DIAGNOSIS — E039 Hypothyroidism, unspecified: Secondary | ICD-10-CM | POA: Diagnosis not present

## 2017-03-19 DIAGNOSIS — M069 Rheumatoid arthritis, unspecified: Secondary | ICD-10-CM | POA: Diagnosis not present

## 2017-03-19 DIAGNOSIS — N183 Chronic kidney disease, stage 3 (moderate): Secondary | ICD-10-CM | POA: Diagnosis not present

## 2017-03-28 DIAGNOSIS — N183 Chronic kidney disease, stage 3 (moderate): Secondary | ICD-10-CM | POA: Diagnosis not present

## 2017-03-28 DIAGNOSIS — M069 Rheumatoid arthritis, unspecified: Secondary | ICD-10-CM | POA: Diagnosis not present

## 2017-03-28 DIAGNOSIS — T464X5A Adverse effect of angiotensin-converting-enzyme inhibitors, initial encounter: Secondary | ICD-10-CM | POA: Diagnosis not present

## 2017-03-28 DIAGNOSIS — I10 Essential (primary) hypertension: Secondary | ICD-10-CM | POA: Diagnosis not present

## 2017-03-28 DIAGNOSIS — R05 Cough: Secondary | ICD-10-CM | POA: Diagnosis not present

## 2017-04-04 DIAGNOSIS — M064 Inflammatory polyarthropathy: Secondary | ICD-10-CM | POA: Diagnosis not present

## 2017-04-24 ENCOUNTER — Encounter (HOSPITAL_BASED_OUTPATIENT_CLINIC_OR_DEPARTMENT_OTHER): Payer: Medicare Other

## 2017-04-25 ENCOUNTER — Encounter (HOSPITAL_BASED_OUTPATIENT_CLINIC_OR_DEPARTMENT_OTHER): Payer: Medicare Other

## 2017-04-29 DIAGNOSIS — M069 Rheumatoid arthritis, unspecified: Secondary | ICD-10-CM | POA: Diagnosis not present

## 2017-04-29 DIAGNOSIS — R05 Cough: Secondary | ICD-10-CM | POA: Diagnosis not present

## 2017-04-29 DIAGNOSIS — I10 Essential (primary) hypertension: Secondary | ICD-10-CM | POA: Diagnosis not present

## 2017-04-29 DIAGNOSIS — N183 Chronic kidney disease, stage 3 (moderate): Secondary | ICD-10-CM | POA: Diagnosis not present

## 2017-04-29 DIAGNOSIS — T464X5A Adverse effect of angiotensin-converting-enzyme inhibitors, initial encounter: Secondary | ICD-10-CM | POA: Diagnosis not present

## 2017-05-13 DIAGNOSIS — I10 Essential (primary) hypertension: Secondary | ICD-10-CM | POA: Diagnosis not present

## 2017-05-27 DIAGNOSIS — I10 Essential (primary) hypertension: Secondary | ICD-10-CM | POA: Diagnosis not present

## 2017-05-27 DIAGNOSIS — N183 Chronic kidney disease, stage 3 (moderate): Secondary | ICD-10-CM | POA: Diagnosis not present

## 2017-06-05 ENCOUNTER — Encounter (HOSPITAL_BASED_OUTPATIENT_CLINIC_OR_DEPARTMENT_OTHER): Payer: Medicare Other

## 2017-06-12 DIAGNOSIS — Z79899 Other long term (current) drug therapy: Secondary | ICD-10-CM | POA: Diagnosis not present

## 2017-06-12 DIAGNOSIS — M064 Inflammatory polyarthropathy: Secondary | ICD-10-CM | POA: Diagnosis not present

## 2017-06-12 DIAGNOSIS — M2550 Pain in unspecified joint: Secondary | ICD-10-CM | POA: Diagnosis not present

## 2017-06-12 DIAGNOSIS — L732 Hidradenitis suppurativa: Secondary | ICD-10-CM | POA: Diagnosis not present

## 2017-06-20 DIAGNOSIS — E039 Hypothyroidism, unspecified: Secondary | ICD-10-CM | POA: Diagnosis not present

## 2017-06-20 DIAGNOSIS — I10 Essential (primary) hypertension: Secondary | ICD-10-CM | POA: Diagnosis not present

## 2017-06-20 DIAGNOSIS — M069 Rheumatoid arthritis, unspecified: Secondary | ICD-10-CM | POA: Diagnosis not present

## 2017-06-20 DIAGNOSIS — R7303 Prediabetes: Secondary | ICD-10-CM | POA: Diagnosis not present

## 2017-06-20 DIAGNOSIS — D509 Iron deficiency anemia, unspecified: Secondary | ICD-10-CM | POA: Diagnosis not present

## 2017-06-20 DIAGNOSIS — N183 Chronic kidney disease, stage 3 (moderate): Secondary | ICD-10-CM | POA: Diagnosis not present

## 2017-07-30 DIAGNOSIS — Z0100 Encounter for examination of eyes and vision without abnormal findings: Secondary | ICD-10-CM | POA: Diagnosis not present

## 2017-07-30 DIAGNOSIS — I10 Essential (primary) hypertension: Secondary | ICD-10-CM | POA: Diagnosis not present

## 2017-07-30 DIAGNOSIS — Z0000 Encounter for general adult medical examination without abnormal findings: Secondary | ICD-10-CM | POA: Diagnosis not present

## 2017-07-30 DIAGNOSIS — Z0110 Encounter for examination of ears and hearing without abnormal findings: Secondary | ICD-10-CM | POA: Diagnosis not present

## 2017-08-04 DIAGNOSIS — Z01419 Encounter for gynecological examination (general) (routine) without abnormal findings: Secondary | ICD-10-CM | POA: Diagnosis not present

## 2017-08-04 DIAGNOSIS — Z124 Encounter for screening for malignant neoplasm of cervix: Secondary | ICD-10-CM | POA: Diagnosis not present

## 2017-08-14 DIAGNOSIS — M2550 Pain in unspecified joint: Secondary | ICD-10-CM | POA: Diagnosis not present

## 2017-08-14 DIAGNOSIS — M064 Inflammatory polyarthropathy: Secondary | ICD-10-CM | POA: Diagnosis not present

## 2017-08-14 DIAGNOSIS — M0609 Rheumatoid arthritis without rheumatoid factor, multiple sites: Secondary | ICD-10-CM | POA: Diagnosis not present

## 2017-08-14 DIAGNOSIS — L732 Hidradenitis suppurativa: Secondary | ICD-10-CM | POA: Diagnosis not present

## 2017-08-27 DIAGNOSIS — I10 Essential (primary) hypertension: Secondary | ICD-10-CM | POA: Diagnosis not present

## 2017-08-27 DIAGNOSIS — E039 Hypothyroidism, unspecified: Secondary | ICD-10-CM | POA: Diagnosis not present

## 2017-08-27 DIAGNOSIS — M069 Rheumatoid arthritis, unspecified: Secondary | ICD-10-CM | POA: Diagnosis not present

## 2017-08-27 DIAGNOSIS — Z5181 Encounter for therapeutic drug level monitoring: Secondary | ICD-10-CM | POA: Diagnosis not present

## 2017-08-27 DIAGNOSIS — N183 Chronic kidney disease, stage 3 (moderate): Secondary | ICD-10-CM | POA: Diagnosis not present

## 2017-08-29 IMAGING — US US RENAL
1 series · 14 of 25 positions shown · non-contrast
Comparison: None.

CLINICAL DATA: Chronic kidney disease stage 3

EXAM:
RENAL / URINARY TRACT ULTRASOUND COMPLETE

[Series 1: us renal · 0.30mm/px · 14 of 50 slices shown]
[im 1/50]
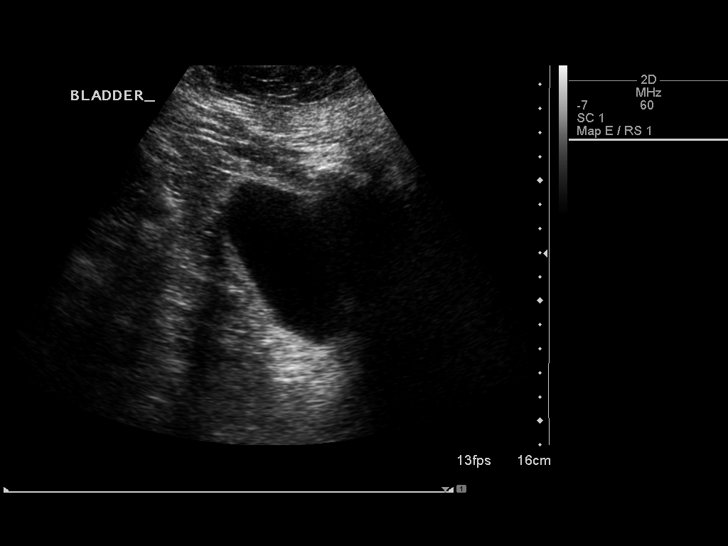
[im 5/50]
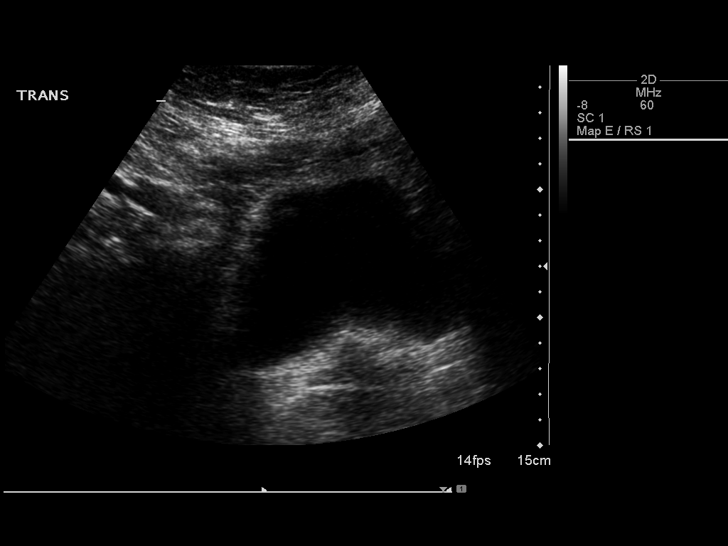
[im 9/50]
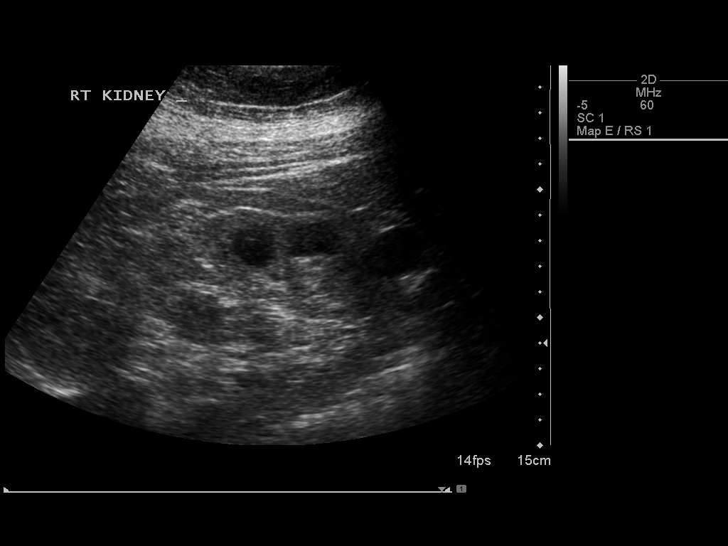
[im 13/50]
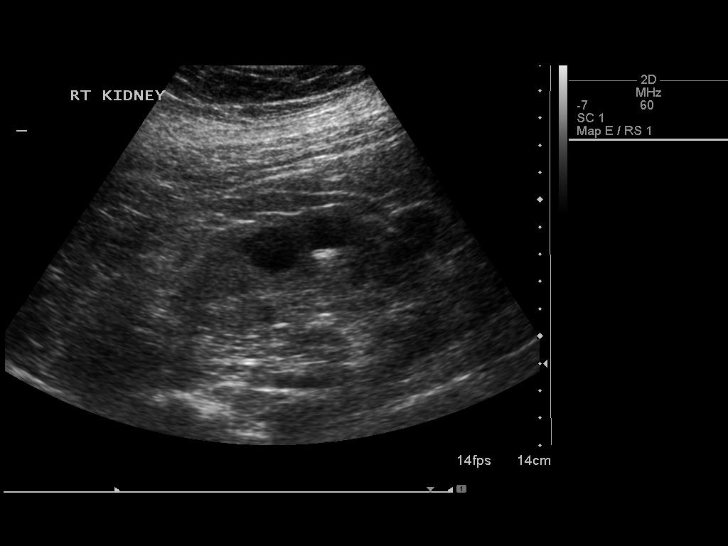
[im 17/50]
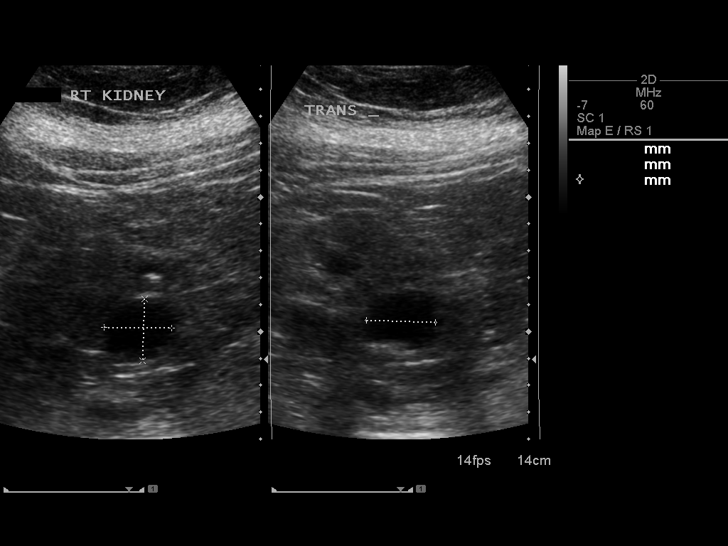
[im 19/50]
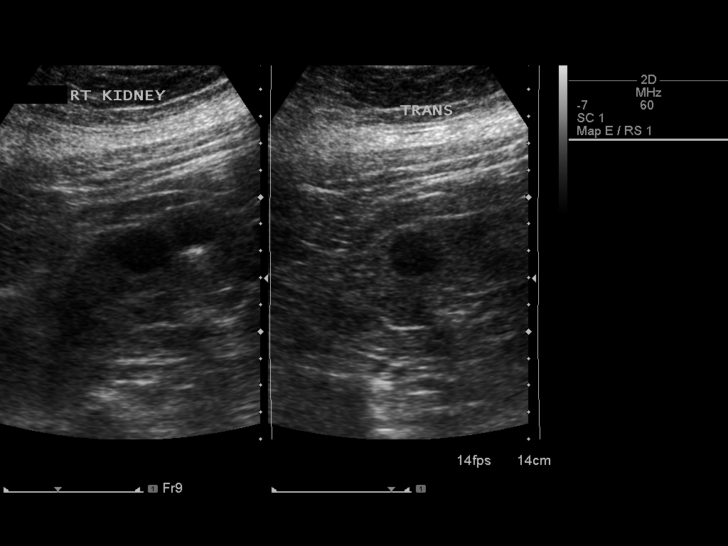
[im 23/50]
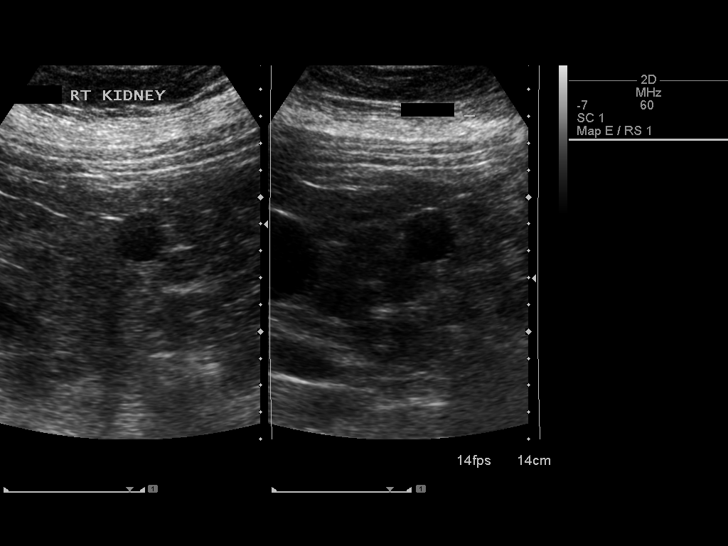
[im 27/50]
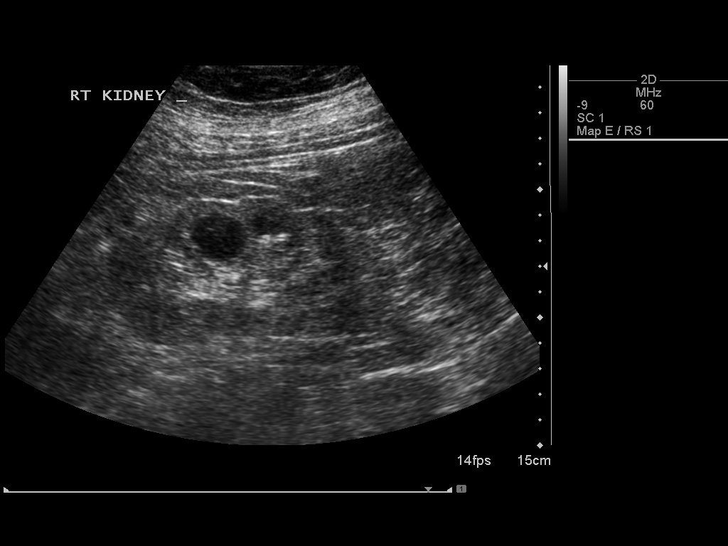
[im 31/50]
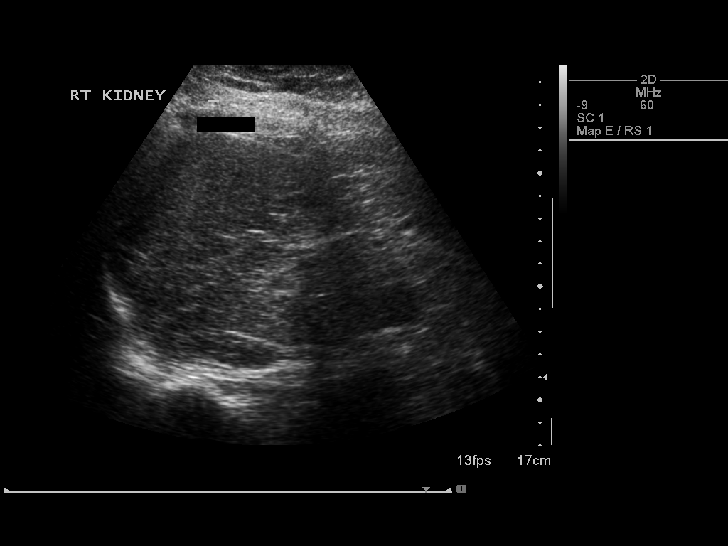
[im 33/50]
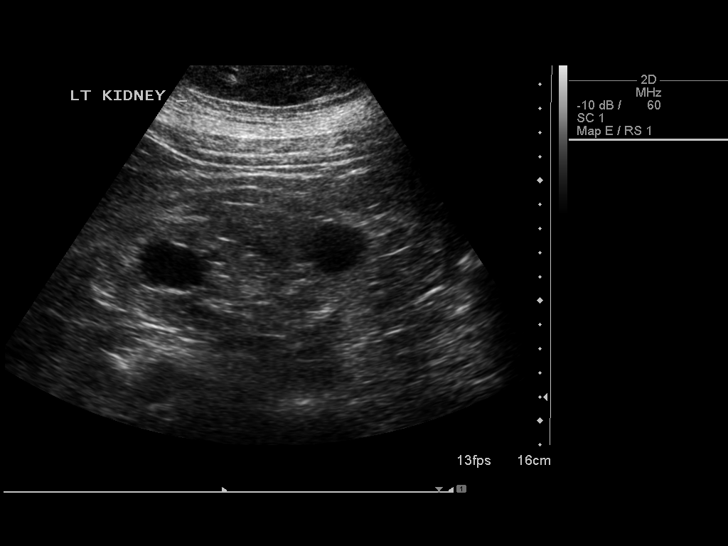
[im 37/50]
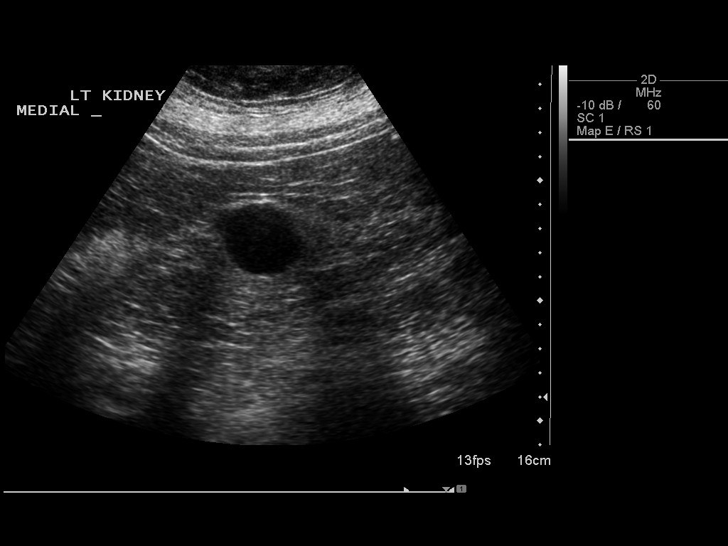
[im 41/50]
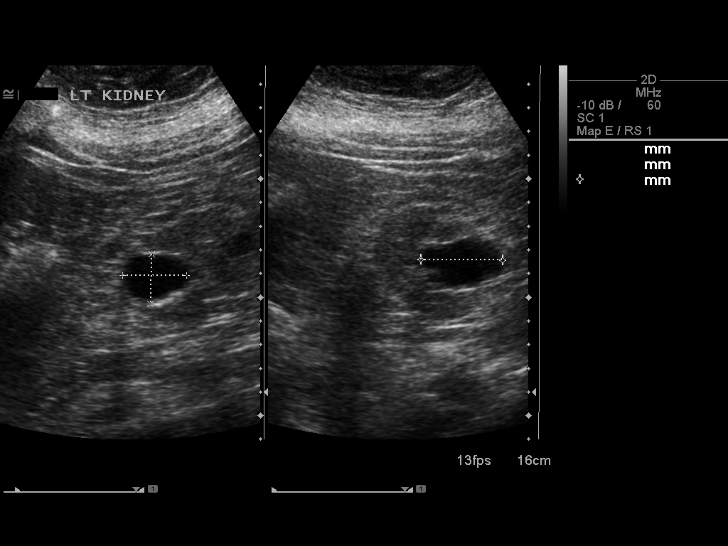
[im 45/50]
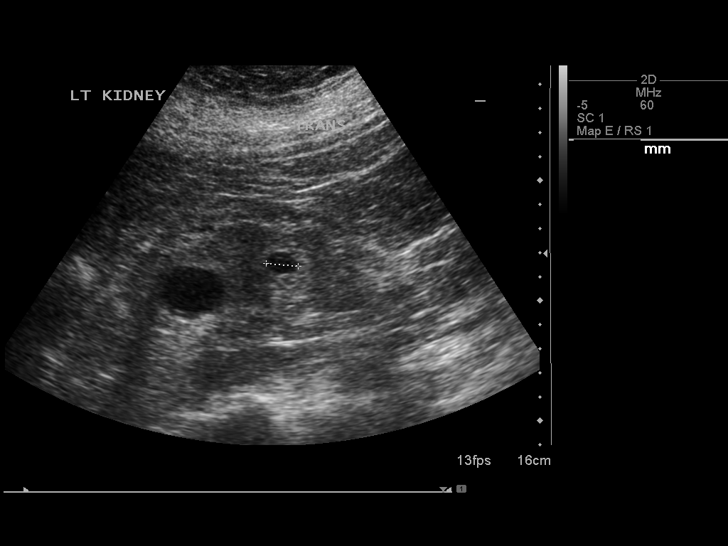
[im 50/50]
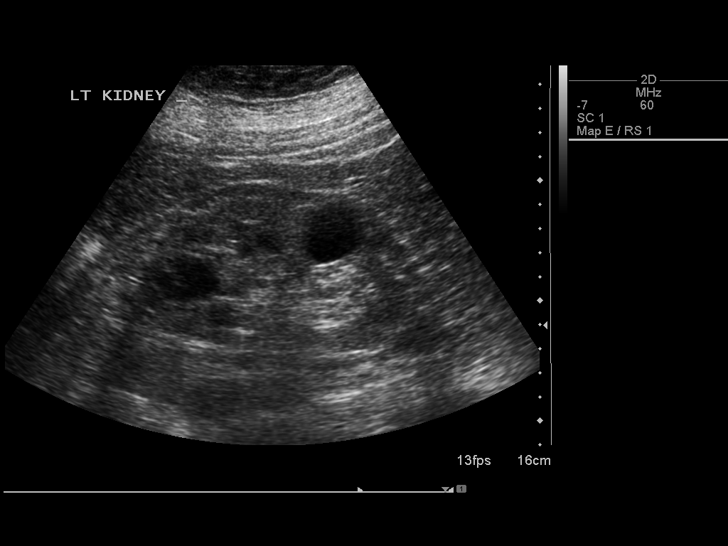

[14 of 25 positions shown; findings below may reference images not displayed]

FINDINGS: Right Kidney:

Length: 12.5 cm. Numerous cysts, the largest 3.5 cm in the midpole.
Mild cortical thinning and increased echotexture. No hydronephrosis.

Left Kidney:

Length: 13.0 cm. Numerous cysts, the largest 3.5 cm in the Central
upper pole. Mild cortical thinning and increased echotexture. No
hydronephrosis.

Bladder:

Appears normal for degree of bladder distention.
IMPRESSION: Cortical thinning with increased echotexture bilaterally compatible
with chronic medical renal disease. Numerous bilateral renal cysts.

No hydronephrosis.

## 2017-09-10 DIAGNOSIS — J069 Acute upper respiratory infection, unspecified: Secondary | ICD-10-CM | POA: Diagnosis not present

## 2017-09-10 DIAGNOSIS — I10 Essential (primary) hypertension: Secondary | ICD-10-CM | POA: Diagnosis not present

## 2017-09-10 DIAGNOSIS — N183 Chronic kidney disease, stage 3 (moderate): Secondary | ICD-10-CM | POA: Diagnosis not present

## 2017-09-10 DIAGNOSIS — M069 Rheumatoid arthritis, unspecified: Secondary | ICD-10-CM | POA: Diagnosis not present

## 2017-09-10 DIAGNOSIS — Z7689 Persons encountering health services in other specified circumstances: Secondary | ICD-10-CM | POA: Diagnosis not present

## 2017-09-10 DIAGNOSIS — R05 Cough: Secondary | ICD-10-CM | POA: Diagnosis not present

## 2017-09-10 DIAGNOSIS — E039 Hypothyroidism, unspecified: Secondary | ICD-10-CM | POA: Diagnosis not present

## 2017-09-27 DIAGNOSIS — H00025 Hordeolum internum left lower eyelid: Secondary | ICD-10-CM | POA: Diagnosis not present

## 2017-10-04 DIAGNOSIS — H00021 Hordeolum internum right upper eyelid: Secondary | ICD-10-CM | POA: Diagnosis not present

## 2017-10-10 DIAGNOSIS — I10 Essential (primary) hypertension: Secondary | ICD-10-CM | POA: Diagnosis not present

## 2017-10-10 DIAGNOSIS — N183 Chronic kidney disease, stage 3 (moderate): Secondary | ICD-10-CM | POA: Diagnosis not present

## 2017-10-10 DIAGNOSIS — M069 Rheumatoid arthritis, unspecified: Secondary | ICD-10-CM | POA: Diagnosis not present

## 2017-10-10 DIAGNOSIS — R7303 Prediabetes: Secondary | ICD-10-CM | POA: Diagnosis not present

## 2017-10-16 DIAGNOSIS — L732 Hidradenitis suppurativa: Secondary | ICD-10-CM | POA: Diagnosis not present

## 2017-10-16 DIAGNOSIS — M064 Inflammatory polyarthropathy: Secondary | ICD-10-CM | POA: Diagnosis not present

## 2017-10-16 DIAGNOSIS — M0609 Rheumatoid arthritis without rheumatoid factor, multiple sites: Secondary | ICD-10-CM | POA: Diagnosis not present

## 2017-10-27 DIAGNOSIS — M25562 Pain in left knee: Secondary | ICD-10-CM | POA: Diagnosis not present

## 2017-11-04 DIAGNOSIS — R05 Cough: Secondary | ICD-10-CM | POA: Diagnosis not present

## 2017-11-04 DIAGNOSIS — J069 Acute upper respiratory infection, unspecified: Secondary | ICD-10-CM | POA: Diagnosis not present

## 2017-11-10 DIAGNOSIS — M069 Rheumatoid arthritis, unspecified: Secondary | ICD-10-CM | POA: Diagnosis not present

## 2017-11-10 DIAGNOSIS — J069 Acute upper respiratory infection, unspecified: Secondary | ICD-10-CM | POA: Diagnosis not present

## 2017-11-10 DIAGNOSIS — R05 Cough: Secondary | ICD-10-CM | POA: Diagnosis not present

## 2017-11-10 DIAGNOSIS — I10 Essential (primary) hypertension: Secondary | ICD-10-CM | POA: Diagnosis not present

## 2017-12-02 DIAGNOSIS — N2581 Secondary hyperparathyroidism of renal origin: Secondary | ICD-10-CM | POA: Diagnosis not present

## 2017-12-02 DIAGNOSIS — I129 Hypertensive chronic kidney disease with stage 1 through stage 4 chronic kidney disease, or unspecified chronic kidney disease: Secondary | ICD-10-CM | POA: Diagnosis not present

## 2017-12-02 DIAGNOSIS — N183 Chronic kidney disease, stage 3 (moderate): Secondary | ICD-10-CM | POA: Diagnosis not present

## 2017-12-02 DIAGNOSIS — R05 Cough: Secondary | ICD-10-CM | POA: Diagnosis not present

## 2017-12-02 DIAGNOSIS — M069 Rheumatoid arthritis, unspecified: Secondary | ICD-10-CM | POA: Diagnosis not present

## 2017-12-02 DIAGNOSIS — T464X5A Adverse effect of angiotensin-converting-enzyme inhibitors, initial encounter: Secondary | ICD-10-CM | POA: Diagnosis not present

## 2017-12-03 DIAGNOSIS — N183 Chronic kidney disease, stage 3 (moderate): Secondary | ICD-10-CM | POA: Diagnosis not present

## 2017-12-03 DIAGNOSIS — I10 Essential (primary) hypertension: Secondary | ICD-10-CM | POA: Diagnosis not present

## 2017-12-03 DIAGNOSIS — M069 Rheumatoid arthritis, unspecified: Secondary | ICD-10-CM | POA: Diagnosis not present

## 2018-01-16 DIAGNOSIS — L732 Hidradenitis suppurativa: Secondary | ICD-10-CM | POA: Diagnosis not present

## 2018-01-16 DIAGNOSIS — M064 Inflammatory polyarthropathy: Secondary | ICD-10-CM | POA: Diagnosis not present

## 2018-01-16 DIAGNOSIS — M2550 Pain in unspecified joint: Secondary | ICD-10-CM | POA: Diagnosis not present

## 2018-01-16 DIAGNOSIS — N183 Chronic kidney disease, stage 3 (moderate): Secondary | ICD-10-CM | POA: Diagnosis not present

## 2018-01-16 DIAGNOSIS — Z79899 Other long term (current) drug therapy: Secondary | ICD-10-CM | POA: Diagnosis not present

## 2018-02-06 DIAGNOSIS — R05 Cough: Secondary | ICD-10-CM | POA: Diagnosis not present

## 2018-02-06 DIAGNOSIS — E039 Hypothyroidism, unspecified: Secondary | ICD-10-CM | POA: Diagnosis not present

## 2018-02-06 DIAGNOSIS — D508 Other iron deficiency anemias: Secondary | ICD-10-CM | POA: Diagnosis not present

## 2018-02-24 DIAGNOSIS — H00024 Hordeolum internum left upper eyelid: Secondary | ICD-10-CM | POA: Diagnosis not present

## 2018-03-06 DIAGNOSIS — R52 Pain, unspecified: Secondary | ICD-10-CM | POA: Diagnosis not present

## 2018-03-06 DIAGNOSIS — D508 Other iron deficiency anemias: Secondary | ICD-10-CM | POA: Diagnosis not present

## 2018-03-06 DIAGNOSIS — Z23 Encounter for immunization: Secondary | ICD-10-CM | POA: Diagnosis not present

## 2018-03-20 DIAGNOSIS — E039 Hypothyroidism, unspecified: Secondary | ICD-10-CM | POA: Diagnosis not present

## 2018-03-20 DIAGNOSIS — I10 Essential (primary) hypertension: Secondary | ICD-10-CM | POA: Diagnosis not present

## 2018-03-20 DIAGNOSIS — N183 Chronic kidney disease, stage 3 (moderate): Secondary | ICD-10-CM | POA: Diagnosis not present

## 2018-03-20 DIAGNOSIS — M069 Rheumatoid arthritis, unspecified: Secondary | ICD-10-CM | POA: Diagnosis not present

## 2018-04-14 DIAGNOSIS — H10013 Acute follicular conjunctivitis, bilateral: Secondary | ICD-10-CM | POA: Diagnosis not present

## 2018-04-14 DIAGNOSIS — S0501XA Injury of conjunctiva and corneal abrasion without foreign body, right eye, initial encounter: Secondary | ICD-10-CM | POA: Diagnosis not present

## 2018-04-15 DIAGNOSIS — H00024 Hordeolum internum left upper eyelid: Secondary | ICD-10-CM | POA: Diagnosis not present

## 2018-04-23 DIAGNOSIS — E559 Vitamin D deficiency, unspecified: Secondary | ICD-10-CM | POA: Diagnosis not present

## 2018-04-23 DIAGNOSIS — N951 Menopausal and female climacteric states: Secondary | ICD-10-CM | POA: Diagnosis not present

## 2018-04-23 DIAGNOSIS — E039 Hypothyroidism, unspecified: Secondary | ICD-10-CM | POA: Diagnosis not present

## 2018-04-27 DIAGNOSIS — R7303 Prediabetes: Secondary | ICD-10-CM | POA: Diagnosis not present

## 2018-04-27 DIAGNOSIS — N951 Menopausal and female climacteric states: Secondary | ICD-10-CM | POA: Diagnosis not present

## 2018-04-27 DIAGNOSIS — E039 Hypothyroidism, unspecified: Secondary | ICD-10-CM | POA: Diagnosis not present

## 2018-04-27 DIAGNOSIS — E559 Vitamin D deficiency, unspecified: Secondary | ICD-10-CM | POA: Diagnosis not present

## 2018-05-04 DIAGNOSIS — R7303 Prediabetes: Secondary | ICD-10-CM | POA: Diagnosis not present

## 2018-05-11 DIAGNOSIS — E559 Vitamin D deficiency, unspecified: Secondary | ICD-10-CM | POA: Diagnosis not present

## 2018-05-19 DIAGNOSIS — Z0001 Encounter for general adult medical examination with abnormal findings: Secondary | ICD-10-CM | POA: Diagnosis not present

## 2018-05-19 DIAGNOSIS — M069 Rheumatoid arthritis, unspecified: Secondary | ICD-10-CM | POA: Diagnosis not present

## 2018-05-19 DIAGNOSIS — J0100 Acute maxillary sinusitis, unspecified: Secondary | ICD-10-CM | POA: Diagnosis not present

## 2018-05-19 DIAGNOSIS — I10 Essential (primary) hypertension: Secondary | ICD-10-CM | POA: Diagnosis not present

## 2018-05-29 DIAGNOSIS — N183 Chronic kidney disease, stage 3 (moderate): Secondary | ICD-10-CM | POA: Diagnosis not present

## 2018-05-29 DIAGNOSIS — N2581 Secondary hyperparathyroidism of renal origin: Secondary | ICD-10-CM | POA: Diagnosis not present

## 2018-05-29 DIAGNOSIS — M069 Rheumatoid arthritis, unspecified: Secondary | ICD-10-CM | POA: Diagnosis not present

## 2018-05-29 DIAGNOSIS — T464X5A Adverse effect of angiotensin-converting-enzyme inhibitors, initial encounter: Secondary | ICD-10-CM | POA: Diagnosis not present

## 2018-05-29 DIAGNOSIS — I129 Hypertensive chronic kidney disease with stage 1 through stage 4 chronic kidney disease, or unspecified chronic kidney disease: Secondary | ICD-10-CM | POA: Diagnosis not present

## 2018-07-02 DIAGNOSIS — R7303 Prediabetes: Secondary | ICD-10-CM | POA: Diagnosis not present

## 2018-07-02 DIAGNOSIS — I10 Essential (primary) hypertension: Secondary | ICD-10-CM | POA: Diagnosis not present

## 2018-07-02 DIAGNOSIS — N183 Chronic kidney disease, stage 3 (moderate): Secondary | ICD-10-CM | POA: Diagnosis not present

## 2018-07-02 DIAGNOSIS — M069 Rheumatoid arthritis, unspecified: Secondary | ICD-10-CM | POA: Diagnosis not present

## 2018-07-02 DIAGNOSIS — R05 Cough: Secondary | ICD-10-CM | POA: Diagnosis not present

## 2018-08-10 DIAGNOSIS — Z0000 Encounter for general adult medical examination without abnormal findings: Secondary | ICD-10-CM | POA: Diagnosis not present

## 2018-08-10 DIAGNOSIS — I10 Essential (primary) hypertension: Secondary | ICD-10-CM | POA: Diagnosis not present

## 2018-08-10 DIAGNOSIS — Z0110 Encounter for examination of ears and hearing without abnormal findings: Secondary | ICD-10-CM | POA: Diagnosis not present

## 2018-08-10 DIAGNOSIS — Z0100 Encounter for examination of eyes and vision without abnormal findings: Secondary | ICD-10-CM | POA: Diagnosis not present

## 2018-08-10 DIAGNOSIS — Z131 Encounter for screening for diabetes mellitus: Secondary | ICD-10-CM | POA: Diagnosis not present

## 2018-08-14 DIAGNOSIS — M2550 Pain in unspecified joint: Secondary | ICD-10-CM | POA: Diagnosis not present

## 2018-08-14 DIAGNOSIS — M064 Inflammatory polyarthropathy: Secondary | ICD-10-CM | POA: Diagnosis not present

## 2018-08-14 DIAGNOSIS — L732 Hidradenitis suppurativa: Secondary | ICD-10-CM | POA: Diagnosis not present

## 2018-08-14 DIAGNOSIS — Z79899 Other long term (current) drug therapy: Secondary | ICD-10-CM | POA: Diagnosis not present

## 2018-08-14 DIAGNOSIS — N183 Chronic kidney disease, stage 3 (moderate): Secondary | ICD-10-CM | POA: Diagnosis not present

## 2018-11-13 DIAGNOSIS — N183 Chronic kidney disease, stage 3 (moderate): Secondary | ICD-10-CM | POA: Diagnosis not present

## 2018-11-13 DIAGNOSIS — I10 Essential (primary) hypertension: Secondary | ICD-10-CM | POA: Diagnosis not present

## 2018-11-13 DIAGNOSIS — M069 Rheumatoid arthritis, unspecified: Secondary | ICD-10-CM | POA: Diagnosis not present

## 2018-11-13 DIAGNOSIS — Z5181 Encounter for therapeutic drug level monitoring: Secondary | ICD-10-CM | POA: Diagnosis not present

## 2018-11-13 DIAGNOSIS — K219 Gastro-esophageal reflux disease without esophagitis: Secondary | ICD-10-CM | POA: Diagnosis not present

## 2018-12-03 DIAGNOSIS — Z1231 Encounter for screening mammogram for malignant neoplasm of breast: Secondary | ICD-10-CM | POA: Diagnosis not present

## 2018-12-18 DIAGNOSIS — M0609 Rheumatoid arthritis without rheumatoid factor, multiple sites: Secondary | ICD-10-CM | POA: Diagnosis not present

## 2018-12-23 DIAGNOSIS — K219 Gastro-esophageal reflux disease without esophagitis: Secondary | ICD-10-CM | POA: Diagnosis not present

## 2018-12-23 DIAGNOSIS — M069 Rheumatoid arthritis, unspecified: Secondary | ICD-10-CM | POA: Diagnosis not present

## 2018-12-23 DIAGNOSIS — I10 Essential (primary) hypertension: Secondary | ICD-10-CM | POA: Diagnosis not present

## 2018-12-23 DIAGNOSIS — N183 Chronic kidney disease, stage 3 (moderate): Secondary | ICD-10-CM | POA: Diagnosis not present

## 2019-02-16 DIAGNOSIS — N183 Chronic kidney disease, stage 3 (moderate): Secondary | ICD-10-CM | POA: Diagnosis not present

## 2019-02-24 DIAGNOSIS — R05 Cough: Secondary | ICD-10-CM | POA: Diagnosis not present

## 2019-02-24 DIAGNOSIS — T464X5A Adverse effect of angiotensin-converting-enzyme inhibitors, initial encounter: Secondary | ICD-10-CM | POA: Diagnosis not present

## 2019-02-24 DIAGNOSIS — N183 Chronic kidney disease, stage 3 (moderate): Secondary | ICD-10-CM | POA: Diagnosis not present

## 2019-02-24 DIAGNOSIS — Z23 Encounter for immunization: Secondary | ICD-10-CM | POA: Diagnosis not present

## 2019-02-24 DIAGNOSIS — I129 Hypertensive chronic kidney disease with stage 1 through stage 4 chronic kidney disease, or unspecified chronic kidney disease: Secondary | ICD-10-CM | POA: Diagnosis not present

## 2019-03-22 DIAGNOSIS — I10 Essential (primary) hypertension: Secondary | ICD-10-CM | POA: Diagnosis not present

## 2019-04-09 ENCOUNTER — Other Ambulatory Visit: Payer: Self-pay

## 2019-04-09 NOTE — Patient Outreach (Signed)
[facility] [facility]) Care Management  04/09/2019  Elizabeth Griffin 05/19/1961 [id number]   Medication Adherence call to [name] Compliant Voice message left with a call back number. [name] is showing past due on Losartan 50 mg under [place].   [place] Management Direct Dial [phone]  Fax [phone] [email or web address]

## 2019-04-14 DIAGNOSIS — M069 Rheumatoid arthritis, unspecified: Secondary | ICD-10-CM | POA: Diagnosis not present

## 2019-04-14 DIAGNOSIS — K219 Gastro-esophageal reflux disease without esophagitis: Secondary | ICD-10-CM | POA: Diagnosis not present

## 2019-04-14 DIAGNOSIS — I10 Essential (primary) hypertension: Secondary | ICD-10-CM | POA: Diagnosis not present

## 2019-04-23 DIAGNOSIS — Z79899 Other long term (current) drug therapy: Secondary | ICD-10-CM | POA: Diagnosis not present

## 2019-04-23 DIAGNOSIS — M0609 Rheumatoid arthritis without rheumatoid factor, multiple sites: Secondary | ICD-10-CM | POA: Diagnosis not present

## 2019-04-23 DIAGNOSIS — L732 Hidradenitis suppurativa: Secondary | ICD-10-CM | POA: Diagnosis not present

## 2019-04-23 DIAGNOSIS — M2550 Pain in unspecified joint: Secondary | ICD-10-CM | POA: Diagnosis not present

## 2019-05-27 DIAGNOSIS — I129 Hypertensive chronic kidney disease with stage 1 through stage 4 chronic kidney disease, or unspecified chronic kidney disease: Secondary | ICD-10-CM | POA: Diagnosis not present

## 2019-05-27 DIAGNOSIS — N2581 Secondary hyperparathyroidism of renal origin: Secondary | ICD-10-CM | POA: Diagnosis not present

## 2019-05-27 DIAGNOSIS — N183 Chronic kidney disease, stage 3 unspecified: Secondary | ICD-10-CM | POA: Diagnosis not present

## 2019-05-27 DIAGNOSIS — M069 Rheumatoid arthritis, unspecified: Secondary | ICD-10-CM | POA: Diagnosis not present

## 2019-05-27 DIAGNOSIS — T464X5A Adverse effect of angiotensin-converting-enzyme inhibitors, initial encounter: Secondary | ICD-10-CM | POA: Diagnosis not present

## 2019-06-01 DIAGNOSIS — M069 Rheumatoid arthritis, unspecified: Secondary | ICD-10-CM | POA: Diagnosis not present

## 2019-06-01 DIAGNOSIS — I10 Essential (primary) hypertension: Secondary | ICD-10-CM | POA: Diagnosis not present

## 2019-06-01 DIAGNOSIS — E039 Hypothyroidism, unspecified: Secondary | ICD-10-CM | POA: Diagnosis not present

## 2019-06-01 DIAGNOSIS — K219 Gastro-esophageal reflux disease without esophagitis: Secondary | ICD-10-CM | POA: Diagnosis not present

## 2019-06-01 DIAGNOSIS — Z0001 Encounter for general adult medical examination with abnormal findings: Secondary | ICD-10-CM | POA: Diagnosis not present

## 2019-06-24 DIAGNOSIS — N2581 Secondary hyperparathyroidism of renal origin: Secondary | ICD-10-CM | POA: Diagnosis not present

## 2019-06-24 DIAGNOSIS — N183 Chronic kidney disease, stage 3 unspecified: Secondary | ICD-10-CM | POA: Diagnosis not present

## 2019-07-12 DIAGNOSIS — M069 Rheumatoid arthritis, unspecified: Secondary | ICD-10-CM | POA: Diagnosis not present

## 2019-07-12 DIAGNOSIS — N183 Chronic kidney disease, stage 3 unspecified: Secondary | ICD-10-CM | POA: Diagnosis not present

## 2019-07-12 DIAGNOSIS — N2581 Secondary hyperparathyroidism of renal origin: Secondary | ICD-10-CM | POA: Diagnosis not present

## 2019-07-12 DIAGNOSIS — I129 Hypertensive chronic kidney disease with stage 1 through stage 4 chronic kidney disease, or unspecified chronic kidney disease: Secondary | ICD-10-CM | POA: Diagnosis not present

## 2019-07-12 DIAGNOSIS — T464X5A Adverse effect of angiotensin-converting-enzyme inhibitors, initial encounter: Secondary | ICD-10-CM | POA: Diagnosis not present

## 2019-08-25 DIAGNOSIS — I10 Essential (primary) hypertension: Secondary | ICD-10-CM | POA: Diagnosis not present

## 2019-08-25 DIAGNOSIS — Z0110 Encounter for examination of ears and hearing without abnormal findings: Secondary | ICD-10-CM | POA: Diagnosis not present

## 2019-08-25 DIAGNOSIS — Z0000 Encounter for general adult medical examination without abnormal findings: Secondary | ICD-10-CM | POA: Diagnosis not present

## 2019-08-25 DIAGNOSIS — Z131 Encounter for screening for diabetes mellitus: Secondary | ICD-10-CM | POA: Diagnosis not present

## 2019-08-25 DIAGNOSIS — Z0100 Encounter for examination of eyes and vision without abnormal findings: Secondary | ICD-10-CM | POA: Diagnosis not present

## 2019-08-25 DIAGNOSIS — Z136 Encounter for screening for cardiovascular disorders: Secondary | ICD-10-CM | POA: Diagnosis not present

## 2019-09-01 DIAGNOSIS — E039 Hypothyroidism, unspecified: Secondary | ICD-10-CM | POA: Diagnosis not present

## 2019-09-01 DIAGNOSIS — Z79899 Other long term (current) drug therapy: Secondary | ICD-10-CM | POA: Diagnosis not present

## 2019-09-01 DIAGNOSIS — G4733 Obstructive sleep apnea (adult) (pediatric): Secondary | ICD-10-CM | POA: Diagnosis not present

## 2019-09-01 DIAGNOSIS — I10 Essential (primary) hypertension: Secondary | ICD-10-CM | POA: Diagnosis not present

## 2019-09-09 DIAGNOSIS — M069 Rheumatoid arthritis, unspecified: Secondary | ICD-10-CM | POA: Diagnosis not present

## 2019-09-09 DIAGNOSIS — I10 Essential (primary) hypertension: Secondary | ICD-10-CM | POA: Diagnosis not present

## 2019-09-09 DIAGNOSIS — K219 Gastro-esophageal reflux disease without esophagitis: Secondary | ICD-10-CM | POA: Diagnosis not present

## 2019-09-16 DIAGNOSIS — K219 Gastro-esophageal reflux disease without esophagitis: Secondary | ICD-10-CM | POA: Diagnosis not present

## 2019-09-16 DIAGNOSIS — Z01818 Encounter for other preprocedural examination: Secondary | ICD-10-CM | POA: Diagnosis not present

## 2019-10-04 DIAGNOSIS — N183 Chronic kidney disease, stage 3 unspecified: Secondary | ICD-10-CM | POA: Diagnosis not present

## 2019-10-13 DIAGNOSIS — T464X5A Adverse effect of angiotensin-converting-enzyme inhibitors, initial encounter: Secondary | ICD-10-CM | POA: Diagnosis not present

## 2019-10-13 DIAGNOSIS — N2581 Secondary hyperparathyroidism of renal origin: Secondary | ICD-10-CM | POA: Diagnosis not present

## 2019-10-13 DIAGNOSIS — N184 Chronic kidney disease, stage 4 (severe): Secondary | ICD-10-CM | POA: Diagnosis not present

## 2019-10-13 DIAGNOSIS — I129 Hypertensive chronic kidney disease with stage 1 through stage 4 chronic kidney disease, or unspecified chronic kidney disease: Secondary | ICD-10-CM | POA: Diagnosis not present

## 2019-10-13 DIAGNOSIS — R809 Proteinuria, unspecified: Secondary | ICD-10-CM | POA: Diagnosis not present

## 2019-11-10 DIAGNOSIS — M0609 Rheumatoid arthritis without rheumatoid factor, multiple sites: Secondary | ICD-10-CM | POA: Diagnosis not present

## 2019-11-10 DIAGNOSIS — Z79899 Other long term (current) drug therapy: Secondary | ICD-10-CM | POA: Diagnosis not present

## 2019-11-10 DIAGNOSIS — L732 Hidradenitis suppurativa: Secondary | ICD-10-CM | POA: Diagnosis not present

## 2019-11-10 DIAGNOSIS — Z6841 Body Mass Index (BMI) 40.0 and over, adult: Secondary | ICD-10-CM | POA: Diagnosis not present

## 2019-11-10 DIAGNOSIS — M2550 Pain in unspecified joint: Secondary | ICD-10-CM | POA: Diagnosis not present

## 2019-11-22 DIAGNOSIS — N184 Chronic kidney disease, stage 4 (severe): Secondary | ICD-10-CM | POA: Diagnosis not present

## 2019-12-08 DIAGNOSIS — Z01419 Encounter for gynecological examination (general) (routine) without abnormal findings: Secondary | ICD-10-CM | POA: Diagnosis not present

## 2019-12-08 DIAGNOSIS — Z1231 Encounter for screening mammogram for malignant neoplasm of breast: Secondary | ICD-10-CM | POA: Diagnosis not present

## 2019-12-08 DIAGNOSIS — Z6841 Body Mass Index (BMI) 40.0 and over, adult: Secondary | ICD-10-CM | POA: Diagnosis not present

## 2019-12-08 DIAGNOSIS — N289 Disorder of kidney and ureter, unspecified: Secondary | ICD-10-CM | POA: Insufficient documentation

## 2019-12-15 DIAGNOSIS — M069 Rheumatoid arthritis, unspecified: Secondary | ICD-10-CM | POA: Diagnosis not present

## 2019-12-15 DIAGNOSIS — I10 Essential (primary) hypertension: Secondary | ICD-10-CM | POA: Diagnosis not present

## 2019-12-15 DIAGNOSIS — E039 Hypothyroidism, unspecified: Secondary | ICD-10-CM | POA: Diagnosis not present

## 2019-12-15 DIAGNOSIS — F419 Anxiety disorder, unspecified: Secondary | ICD-10-CM | POA: Diagnosis not present

## 2019-12-15 DIAGNOSIS — Z131 Encounter for screening for diabetes mellitus: Secondary | ICD-10-CM | POA: Diagnosis not present

## 2019-12-15 DIAGNOSIS — N1832 Chronic kidney disease, stage 3b: Secondary | ICD-10-CM | POA: Diagnosis not present

## 2019-12-15 DIAGNOSIS — K641 Second degree hemorrhoids: Secondary | ICD-10-CM | POA: Diagnosis not present

## 2019-12-15 DIAGNOSIS — K219 Gastro-esophageal reflux disease without esophagitis: Secondary | ICD-10-CM | POA: Diagnosis not present

## 2019-12-17 DIAGNOSIS — I10 Essential (primary) hypertension: Secondary | ICD-10-CM | POA: Diagnosis not present

## 2019-12-17 DIAGNOSIS — G4733 Obstructive sleep apnea (adult) (pediatric): Secondary | ICD-10-CM | POA: Diagnosis not present

## 2019-12-17 DIAGNOSIS — Z6841 Body Mass Index (BMI) 40.0 and over, adult: Secondary | ICD-10-CM | POA: Diagnosis not present

## 2019-12-17 DIAGNOSIS — E039 Hypothyroidism, unspecified: Secondary | ICD-10-CM | POA: Diagnosis not present

## 2019-12-17 DIAGNOSIS — K219 Gastro-esophageal reflux disease without esophagitis: Secondary | ICD-10-CM | POA: Diagnosis not present

## 2019-12-17 DIAGNOSIS — F419 Anxiety disorder, unspecified: Secondary | ICD-10-CM | POA: Diagnosis not present

## 2020-01-03 DIAGNOSIS — N184 Chronic kidney disease, stage 4 (severe): Secondary | ICD-10-CM | POA: Diagnosis not present

## 2020-01-13 DIAGNOSIS — R809 Proteinuria, unspecified: Secondary | ICD-10-CM | POA: Diagnosis not present

## 2020-01-13 DIAGNOSIS — N184 Chronic kidney disease, stage 4 (severe): Secondary | ICD-10-CM | POA: Diagnosis not present

## 2020-01-13 DIAGNOSIS — M069 Rheumatoid arthritis, unspecified: Secondary | ICD-10-CM | POA: Diagnosis not present

## 2020-01-13 DIAGNOSIS — T464X5A Adverse effect of angiotensin-converting-enzyme inhibitors, initial encounter: Secondary | ICD-10-CM | POA: Diagnosis not present

## 2020-01-13 DIAGNOSIS — N2581 Secondary hyperparathyroidism of renal origin: Secondary | ICD-10-CM | POA: Diagnosis not present

## 2020-01-13 DIAGNOSIS — R05 Cough: Secondary | ICD-10-CM | POA: Diagnosis not present

## 2020-01-13 DIAGNOSIS — I129 Hypertensive chronic kidney disease with stage 1 through stage 4 chronic kidney disease, or unspecified chronic kidney disease: Secondary | ICD-10-CM | POA: Diagnosis not present

## 2020-01-14 DIAGNOSIS — E039 Hypothyroidism, unspecified: Secondary | ICD-10-CM | POA: Diagnosis not present

## 2020-01-14 DIAGNOSIS — G4733 Obstructive sleep apnea (adult) (pediatric): Secondary | ICD-10-CM | POA: Diagnosis not present

## 2020-01-14 DIAGNOSIS — I10 Essential (primary) hypertension: Secondary | ICD-10-CM | POA: Diagnosis not present

## 2020-01-14 DIAGNOSIS — K219 Gastro-esophageal reflux disease without esophagitis: Secondary | ICD-10-CM | POA: Diagnosis not present

## 2020-01-14 DIAGNOSIS — F419 Anxiety disorder, unspecified: Secondary | ICD-10-CM | POA: Diagnosis not present

## 2020-01-19 DIAGNOSIS — K219 Gastro-esophageal reflux disease without esophagitis: Secondary | ICD-10-CM | POA: Diagnosis not present

## 2020-01-19 DIAGNOSIS — K317 Polyp of stomach and duodenum: Secondary | ICD-10-CM | POA: Diagnosis not present

## 2020-01-19 DIAGNOSIS — Z01818 Encounter for other preprocedural examination: Secondary | ICD-10-CM | POA: Diagnosis not present

## 2020-01-19 DIAGNOSIS — K449 Diaphragmatic hernia without obstruction or gangrene: Secondary | ICD-10-CM | POA: Diagnosis not present

## 2020-01-24 DIAGNOSIS — N184 Chronic kidney disease, stage 4 (severe): Secondary | ICD-10-CM | POA: Diagnosis not present

## 2020-02-09 DIAGNOSIS — K219 Gastro-esophageal reflux disease without esophagitis: Secondary | ICD-10-CM | POA: Diagnosis not present

## 2020-02-09 DIAGNOSIS — E039 Hypothyroidism, unspecified: Secondary | ICD-10-CM | POA: Diagnosis not present

## 2020-02-09 DIAGNOSIS — G4733 Obstructive sleep apnea (adult) (pediatric): Secondary | ICD-10-CM | POA: Diagnosis not present

## 2020-02-09 DIAGNOSIS — F419 Anxiety disorder, unspecified: Secondary | ICD-10-CM | POA: Diagnosis not present

## 2020-02-09 DIAGNOSIS — I10 Essential (primary) hypertension: Secondary | ICD-10-CM | POA: Diagnosis not present

## 2020-03-10 DIAGNOSIS — K219 Gastro-esophageal reflux disease without esophagitis: Secondary | ICD-10-CM | POA: Diagnosis not present

## 2020-03-10 DIAGNOSIS — G4733 Obstructive sleep apnea (adult) (pediatric): Secondary | ICD-10-CM | POA: Diagnosis not present

## 2020-03-10 DIAGNOSIS — F419 Anxiety disorder, unspecified: Secondary | ICD-10-CM | POA: Diagnosis not present

## 2020-03-10 DIAGNOSIS — E039 Hypothyroidism, unspecified: Secondary | ICD-10-CM | POA: Diagnosis not present

## 2020-03-10 DIAGNOSIS — I10 Essential (primary) hypertension: Secondary | ICD-10-CM | POA: Diagnosis not present

## 2020-03-10 DIAGNOSIS — Z6841 Body Mass Index (BMI) 40.0 and over, adult: Secondary | ICD-10-CM | POA: Diagnosis not present

## 2020-03-17 DIAGNOSIS — K219 Gastro-esophageal reflux disease without esophagitis: Secondary | ICD-10-CM | POA: Diagnosis not present

## 2020-03-17 DIAGNOSIS — F419 Anxiety disorder, unspecified: Secondary | ICD-10-CM | POA: Diagnosis not present

## 2020-03-17 DIAGNOSIS — E611 Iron deficiency: Secondary | ICD-10-CM | POA: Diagnosis not present

## 2020-03-17 DIAGNOSIS — N1832 Chronic kidney disease, stage 3b: Secondary | ICD-10-CM | POA: Diagnosis not present

## 2020-03-17 DIAGNOSIS — M069 Rheumatoid arthritis, unspecified: Secondary | ICD-10-CM | POA: Diagnosis not present

## 2020-03-17 DIAGNOSIS — E039 Hypothyroidism, unspecified: Secondary | ICD-10-CM | POA: Diagnosis not present

## 2020-03-17 DIAGNOSIS — I10 Essential (primary) hypertension: Secondary | ICD-10-CM | POA: Diagnosis not present

## 2020-03-17 DIAGNOSIS — K641 Second degree hemorrhoids: Secondary | ICD-10-CM | POA: Diagnosis not present

## 2020-04-05 DIAGNOSIS — I10 Essential (primary) hypertension: Secondary | ICD-10-CM | POA: Diagnosis not present

## 2020-04-05 DIAGNOSIS — F419 Anxiety disorder, unspecified: Secondary | ICD-10-CM | POA: Diagnosis not present

## 2020-04-05 DIAGNOSIS — J0100 Acute maxillary sinusitis, unspecified: Secondary | ICD-10-CM | POA: Diagnosis not present

## 2020-04-05 DIAGNOSIS — K219 Gastro-esophageal reflux disease without esophagitis: Secondary | ICD-10-CM | POA: Diagnosis not present

## 2020-04-05 DIAGNOSIS — N1832 Chronic kidney disease, stage 3b: Secondary | ICD-10-CM | POA: Diagnosis not present

## 2020-04-05 DIAGNOSIS — M069 Rheumatoid arthritis, unspecified: Secondary | ICD-10-CM | POA: Diagnosis not present

## 2020-04-05 DIAGNOSIS — E039 Hypothyroidism, unspecified: Secondary | ICD-10-CM | POA: Diagnosis not present

## 2020-04-05 DIAGNOSIS — K641 Second degree hemorrhoids: Secondary | ICD-10-CM | POA: Diagnosis not present

## 2020-04-05 DIAGNOSIS — N184 Chronic kidney disease, stage 4 (severe): Secondary | ICD-10-CM | POA: Diagnosis not present

## 2020-04-12 DIAGNOSIS — T464X5A Adverse effect of angiotensin-converting-enzyme inhibitors, initial encounter: Secondary | ICD-10-CM | POA: Diagnosis not present

## 2020-04-12 DIAGNOSIS — I129 Hypertensive chronic kidney disease with stage 1 through stage 4 chronic kidney disease, or unspecified chronic kidney disease: Secondary | ICD-10-CM | POA: Diagnosis not present

## 2020-04-12 DIAGNOSIS — N2581 Secondary hyperparathyroidism of renal origin: Secondary | ICD-10-CM | POA: Diagnosis not present

## 2020-04-12 DIAGNOSIS — M069 Rheumatoid arthritis, unspecified: Secondary | ICD-10-CM | POA: Diagnosis not present

## 2020-04-12 DIAGNOSIS — R058 Other specified cough: Secondary | ICD-10-CM | POA: Diagnosis not present

## 2020-04-12 DIAGNOSIS — N184 Chronic kidney disease, stage 4 (severe): Secondary | ICD-10-CM | POA: Diagnosis not present

## 2020-04-12 DIAGNOSIS — R809 Proteinuria, unspecified: Secondary | ICD-10-CM | POA: Diagnosis not present

## 2020-05-17 DIAGNOSIS — N1832 Chronic kidney disease, stage 3b: Secondary | ICD-10-CM | POA: Diagnosis not present

## 2020-05-17 DIAGNOSIS — J0100 Acute maxillary sinusitis, unspecified: Secondary | ICD-10-CM | POA: Diagnosis not present

## 2020-05-17 DIAGNOSIS — F419 Anxiety disorder, unspecified: Secondary | ICD-10-CM | POA: Diagnosis not present

## 2020-05-17 DIAGNOSIS — M069 Rheumatoid arthritis, unspecified: Secondary | ICD-10-CM | POA: Diagnosis not present

## 2020-05-17 DIAGNOSIS — K219 Gastro-esophageal reflux disease without esophagitis: Secondary | ICD-10-CM | POA: Diagnosis not present

## 2020-05-17 DIAGNOSIS — I10 Essential (primary) hypertension: Secondary | ICD-10-CM | POA: Diagnosis not present

## 2020-05-17 DIAGNOSIS — K641 Second degree hemorrhoids: Secondary | ICD-10-CM | POA: Diagnosis not present

## 2020-05-17 DIAGNOSIS — E039 Hypothyroidism, unspecified: Secondary | ICD-10-CM | POA: Diagnosis not present

## 2020-05-23 DIAGNOSIS — L732 Hidradenitis suppurativa: Secondary | ICD-10-CM | POA: Diagnosis not present

## 2020-05-23 DIAGNOSIS — M0609 Rheumatoid arthritis without rheumatoid factor, multiple sites: Secondary | ICD-10-CM | POA: Diagnosis not present

## 2020-05-23 DIAGNOSIS — Z6841 Body Mass Index (BMI) 40.0 and over, adult: Secondary | ICD-10-CM | POA: Diagnosis not present

## 2020-05-23 DIAGNOSIS — N184 Chronic kidney disease, stage 4 (severe): Secondary | ICD-10-CM | POA: Diagnosis not present

## 2020-05-23 DIAGNOSIS — M2550 Pain in unspecified joint: Secondary | ICD-10-CM | POA: Diagnosis not present

## 2020-05-23 DIAGNOSIS — Z79899 Other long term (current) drug therapy: Secondary | ICD-10-CM | POA: Diagnosis not present

## 2020-07-03 DIAGNOSIS — N184 Chronic kidney disease, stage 4 (severe): Secondary | ICD-10-CM | POA: Diagnosis not present

## 2020-07-04 DIAGNOSIS — F4320 Adjustment disorder, unspecified: Secondary | ICD-10-CM | POA: Diagnosis not present

## 2020-07-07 DIAGNOSIS — F419 Anxiety disorder, unspecified: Secondary | ICD-10-CM | POA: Diagnosis not present

## 2020-07-07 DIAGNOSIS — N1832 Chronic kidney disease, stage 3b: Secondary | ICD-10-CM | POA: Diagnosis not present

## 2020-07-07 DIAGNOSIS — I10 Essential (primary) hypertension: Secondary | ICD-10-CM | POA: Diagnosis not present

## 2020-07-12 DIAGNOSIS — F4320 Adjustment disorder, unspecified: Secondary | ICD-10-CM | POA: Diagnosis not present

## 2020-07-13 DIAGNOSIS — N2581 Secondary hyperparathyroidism of renal origin: Secondary | ICD-10-CM | POA: Diagnosis not present

## 2020-07-13 DIAGNOSIS — M069 Rheumatoid arthritis, unspecified: Secondary | ICD-10-CM | POA: Diagnosis not present

## 2020-07-13 DIAGNOSIS — T464X5A Adverse effect of angiotensin-converting-enzyme inhibitors, initial encounter: Secondary | ICD-10-CM | POA: Diagnosis not present

## 2020-07-13 DIAGNOSIS — R058 Other specified cough: Secondary | ICD-10-CM | POA: Diagnosis not present

## 2020-07-13 DIAGNOSIS — R809 Proteinuria, unspecified: Secondary | ICD-10-CM | POA: Diagnosis not present

## 2020-07-13 DIAGNOSIS — N184 Chronic kidney disease, stage 4 (severe): Secondary | ICD-10-CM | POA: Diagnosis not present

## 2020-07-13 DIAGNOSIS — I129 Hypertensive chronic kidney disease with stage 1 through stage 4 chronic kidney disease, or unspecified chronic kidney disease: Secondary | ICD-10-CM | POA: Diagnosis not present

## 2020-07-20 DIAGNOSIS — I10 Essential (primary) hypertension: Secondary | ICD-10-CM | POA: Diagnosis not present

## 2020-07-20 DIAGNOSIS — E039 Hypothyroidism, unspecified: Secondary | ICD-10-CM | POA: Diagnosis not present

## 2020-07-20 DIAGNOSIS — J0100 Acute maxillary sinusitis, unspecified: Secondary | ICD-10-CM | POA: Diagnosis not present

## 2020-07-20 DIAGNOSIS — Z0001 Encounter for general adult medical examination with abnormal findings: Secondary | ICD-10-CM | POA: Diagnosis not present

## 2020-07-20 DIAGNOSIS — M069 Rheumatoid arthritis, unspecified: Secondary | ICD-10-CM | POA: Diagnosis not present

## 2020-07-20 DIAGNOSIS — F419 Anxiety disorder, unspecified: Secondary | ICD-10-CM | POA: Diagnosis not present

## 2020-07-20 DIAGNOSIS — R42 Dizziness and giddiness: Secondary | ICD-10-CM | POA: Diagnosis not present

## 2020-07-20 DIAGNOSIS — K219 Gastro-esophageal reflux disease without esophagitis: Secondary | ICD-10-CM | POA: Diagnosis not present

## 2020-08-09 DIAGNOSIS — Z79899 Other long term (current) drug therapy: Secondary | ICD-10-CM | POA: Diagnosis not present

## 2020-08-09 DIAGNOSIS — Z6841 Body Mass Index (BMI) 40.0 and over, adult: Secondary | ICD-10-CM | POA: Diagnosis not present

## 2020-08-09 DIAGNOSIS — K449 Diaphragmatic hernia without obstruction or gangrene: Secondary | ICD-10-CM | POA: Diagnosis not present

## 2020-08-29 DIAGNOSIS — Z01818 Encounter for other preprocedural examination: Secondary | ICD-10-CM | POA: Diagnosis not present

## 2020-08-29 DIAGNOSIS — D573 Sickle-cell trait: Secondary | ICD-10-CM | POA: Diagnosis not present

## 2020-09-13 DIAGNOSIS — Z6841 Body Mass Index (BMI) 40.0 and over, adult: Secondary | ICD-10-CM | POA: Diagnosis not present

## 2020-09-13 DIAGNOSIS — Z01810 Encounter for preprocedural cardiovascular examination: Secondary | ICD-10-CM | POA: Diagnosis not present

## 2020-09-13 DIAGNOSIS — Z01812 Encounter for preprocedural laboratory examination: Secondary | ICD-10-CM | POA: Diagnosis not present

## 2020-09-13 DIAGNOSIS — R9431 Abnormal electrocardiogram [ECG] [EKG]: Secondary | ICD-10-CM | POA: Diagnosis not present

## 2020-09-18 DIAGNOSIS — Z538 Procedure and treatment not carried out for other reasons: Secondary | ICD-10-CM | POA: Diagnosis not present

## 2020-09-18 DIAGNOSIS — L732 Hidradenitis suppurativa: Secondary | ICD-10-CM | POA: Diagnosis not present

## 2020-09-18 DIAGNOSIS — L02211 Cutaneous abscess of abdominal wall: Secondary | ICD-10-CM | POA: Diagnosis not present

## 2020-09-18 DIAGNOSIS — Z6841 Body Mass Index (BMI) 40.0 and over, adult: Secondary | ICD-10-CM | POA: Diagnosis not present

## 2020-09-20 ENCOUNTER — Other Ambulatory Visit: Payer: Self-pay

## 2020-09-20 NOTE — Patient Outreach (Signed)
[facility]) Care Management  09/20/2020  Elizabeth Griffin 08-Jan-1961 [id number]     Transition of Care Referral  Referral Date: 09/20/2020 Referral Source: [employer] Discharge Report Date of Discharge: 09/18/2020 Facility: [facility]     Outreach attempt # 1 to patient. No answer at present. RN CM left HIPAA compliant voicemail message along with contact info.     Plan: RN CM will make outreach attempt to patient within 3-4 business days. RN CM will send unsuccessful outreach letter to patient.   Enzo Montgomery, RN,BSN,CCM [place] Management Telephonic Care Management Coordinator Direct Phone: [phone] Toll Free: [phone] Fax: [phone]

## 2020-09-20 NOTE — Patient Outreach (Signed)
[place] [facility]) Care Management  09/20/2020  Elizabeth Griffin Nov 14, 1960 [id number]   Transition of Care Referral  Referral Date: 09/20/2020 Referral Source: [facility] Discharge Report Date of Discharge: 09/18/2020 Facility: [facility]  Incoming call from patient returning RN CM call. Spoke with patient who reported she was scheduled to have gastric sleeve surgery. However, during admission work up it was found that she had an abscess and unable to undergo procedure. It has been rescheduled for 09/29/20. She is on abx therapy in the meantime. She is aware of importance of completing therapy as well as s/s of worsening condition. She reports that she was not actually admitted tot he hospital. Patient confirms that correct PCP on file and that she has [employer].    Enzo Montgomery, RN,BSN,CCM [place] Management Telephonic Care Management Coordinator Direct Phone: [phone] Toll Free: [phone] Fax: [phone]

## 2020-09-27 DIAGNOSIS — R42 Dizziness and giddiness: Secondary | ICD-10-CM | POA: Diagnosis not present

## 2020-09-27 DIAGNOSIS — N1832 Chronic kidney disease, stage 3b: Secondary | ICD-10-CM | POA: Diagnosis not present

## 2020-09-27 DIAGNOSIS — I10 Essential (primary) hypertension: Secondary | ICD-10-CM | POA: Diagnosis not present

## 2020-09-27 DIAGNOSIS — K219 Gastro-esophageal reflux disease without esophagitis: Secondary | ICD-10-CM | POA: Diagnosis not present

## 2020-09-27 DIAGNOSIS — E039 Hypothyroidism, unspecified: Secondary | ICD-10-CM | POA: Diagnosis not present

## 2020-09-27 DIAGNOSIS — M069 Rheumatoid arthritis, unspecified: Secondary | ICD-10-CM | POA: Diagnosis not present

## 2020-09-27 DIAGNOSIS — L0292 Furuncle, unspecified: Secondary | ICD-10-CM | POA: Diagnosis not present

## 2020-09-27 DIAGNOSIS — F419 Anxiety disorder, unspecified: Secondary | ICD-10-CM | POA: Diagnosis not present

## 2020-11-01 DIAGNOSIS — N184 Chronic kidney disease, stage 4 (severe): Secondary | ICD-10-CM | POA: Diagnosis not present

## 2020-11-07 DIAGNOSIS — N2581 Secondary hyperparathyroidism of renal origin: Secondary | ICD-10-CM | POA: Diagnosis not present

## 2020-11-07 DIAGNOSIS — R058 Other specified cough: Secondary | ICD-10-CM | POA: Diagnosis not present

## 2020-11-07 DIAGNOSIS — M069 Rheumatoid arthritis, unspecified: Secondary | ICD-10-CM | POA: Diagnosis not present

## 2020-11-07 DIAGNOSIS — I129 Hypertensive chronic kidney disease with stage 1 through stage 4 chronic kidney disease, or unspecified chronic kidney disease: Secondary | ICD-10-CM | POA: Diagnosis not present

## 2020-11-07 DIAGNOSIS — T464X5A Adverse effect of angiotensin-converting-enzyme inhibitors, initial encounter: Secondary | ICD-10-CM | POA: Diagnosis not present

## 2020-11-07 DIAGNOSIS — N184 Chronic kidney disease, stage 4 (severe): Secondary | ICD-10-CM | POA: Diagnosis not present

## 2020-11-07 DIAGNOSIS — R809 Proteinuria, unspecified: Secondary | ICD-10-CM | POA: Diagnosis not present

## 2020-11-21 DIAGNOSIS — M0609 Rheumatoid arthritis without rheumatoid factor, multiple sites: Secondary | ICD-10-CM | POA: Diagnosis not present

## 2020-11-21 DIAGNOSIS — Z6841 Body Mass Index (BMI) 40.0 and over, adult: Secondary | ICD-10-CM | POA: Diagnosis not present

## 2020-11-21 DIAGNOSIS — M2550 Pain in unspecified joint: Secondary | ICD-10-CM | POA: Diagnosis not present

## 2020-11-21 DIAGNOSIS — L732 Hidradenitis suppurativa: Secondary | ICD-10-CM | POA: Diagnosis not present

## 2020-11-21 DIAGNOSIS — Z79899 Other long term (current) drug therapy: Secondary | ICD-10-CM | POA: Diagnosis not present

## 2020-11-21 DIAGNOSIS — N184 Chronic kidney disease, stage 4 (severe): Secondary | ICD-10-CM | POA: Diagnosis not present

## 2020-11-22 DIAGNOSIS — Z6841 Body Mass Index (BMI) 40.0 and over, adult: Secondary | ICD-10-CM | POA: Diagnosis not present

## 2020-11-22 DIAGNOSIS — Z01818 Encounter for other preprocedural examination: Secondary | ICD-10-CM | POA: Diagnosis not present

## 2020-11-22 DIAGNOSIS — K449 Diaphragmatic hernia without obstruction or gangrene: Secondary | ICD-10-CM | POA: Diagnosis not present

## 2020-11-27 DIAGNOSIS — R7303 Prediabetes: Secondary | ICD-10-CM | POA: Diagnosis not present

## 2020-11-27 DIAGNOSIS — I10 Essential (primary) hypertension: Secondary | ICD-10-CM | POA: Diagnosis not present

## 2020-11-27 DIAGNOSIS — L732 Hidradenitis suppurativa: Secondary | ICD-10-CM | POA: Diagnosis not present

## 2020-11-27 DIAGNOSIS — M069 Rheumatoid arthritis, unspecified: Secondary | ICD-10-CM | POA: Diagnosis not present

## 2020-11-27 DIAGNOSIS — I129 Hypertensive chronic kidney disease with stage 1 through stage 4 chronic kidney disease, or unspecified chronic kidney disease: Secondary | ICD-10-CM | POA: Diagnosis not present

## 2020-11-27 DIAGNOSIS — N189 Chronic kidney disease, unspecified: Secondary | ICD-10-CM | POA: Diagnosis not present

## 2020-11-27 DIAGNOSIS — K449 Diaphragmatic hernia without obstruction or gangrene: Secondary | ICD-10-CM | POA: Diagnosis not present

## 2020-11-27 DIAGNOSIS — K219 Gastro-esophageal reflux disease without esophagitis: Secondary | ICD-10-CM | POA: Diagnosis not present

## 2020-11-27 DIAGNOSIS — K660 Peritoneal adhesions (postprocedural) (postinfection): Secondary | ICD-10-CM | POA: Diagnosis not present

## 2020-11-27 DIAGNOSIS — D573 Sickle-cell trait: Secondary | ICD-10-CM | POA: Diagnosis not present

## 2020-11-27 DIAGNOSIS — K317 Polyp of stomach and duodenum: Secondary | ICD-10-CM | POA: Diagnosis not present

## 2020-11-27 DIAGNOSIS — Z6841 Body Mass Index (BMI) 40.0 and over, adult: Secondary | ICD-10-CM | POA: Diagnosis not present

## 2020-11-28 DIAGNOSIS — K449 Diaphragmatic hernia without obstruction or gangrene: Secondary | ICD-10-CM | POA: Diagnosis not present

## 2020-11-28 DIAGNOSIS — Z6841 Body Mass Index (BMI) 40.0 and over, adult: Secondary | ICD-10-CM | POA: Diagnosis not present

## 2020-11-28 DIAGNOSIS — R7303 Prediabetes: Secondary | ICD-10-CM | POA: Diagnosis not present

## 2020-11-28 DIAGNOSIS — I129 Hypertensive chronic kidney disease with stage 1 through stage 4 chronic kidney disease, or unspecified chronic kidney disease: Secondary | ICD-10-CM | POA: Diagnosis not present

## 2020-11-28 DIAGNOSIS — N189 Chronic kidney disease, unspecified: Secondary | ICD-10-CM | POA: Diagnosis not present

## 2020-11-28 DIAGNOSIS — D573 Sickle-cell trait: Secondary | ICD-10-CM | POA: Diagnosis not present

## 2020-11-28 DIAGNOSIS — K317 Polyp of stomach and duodenum: Secondary | ICD-10-CM | POA: Diagnosis not present

## 2020-11-28 DIAGNOSIS — K219 Gastro-esophageal reflux disease without esophagitis: Secondary | ICD-10-CM | POA: Diagnosis not present

## 2020-11-30 ENCOUNTER — Other Ambulatory Visit: Payer: Self-pay

## 2020-11-30 NOTE — Patient Outreach (Signed)
[place] [facility]) Care Management  11/30/2020  Elizabeth Griffin [birth year] [id number]     Transition of Care Referral  Referral Date: 11/30/2020 Referral Source: [facility] Discharge Report Date of Discharge: 11/28/2020 Facility: [facility]     Outreach attempt # 1 to patient. No answer at present. Rn CM left HIPAA compliant voicemail along with contact info.    Plan: RN CM will make outreach attempt to patient within 3-4 business days.    Enzo Montgomery, RN,BSN,CCM [place] Management Telephonic Care Management Coordinator Direct Phone: [phone] Toll Free: [phone] Fax: [phone]

## 2020-12-01 ENCOUNTER — Other Ambulatory Visit: Payer: Self-pay

## 2020-12-01 NOTE — Patient Outreach (Signed)
[facility]) Care Management  12/01/2020  Elizabeth Griffin Aug 22, 1960 [id number]   Transition of Care Referral   Referral Date: 11/30/2020 Referral Source: [facility] Discharge Report Date of Discharge: 11/28/2020 Facility: [facility]    Voicemail message received from patient. Return call placed to patient. Spoke with patient who voices she is doing well. She denies any acute issues or concerns at this time. States her wgt loss surgery went well. She has follow up appt in 12/13/20 and able to get to appt. She confirms she has all her meds. She is adhering to liquid diet and reports she is passing gas and has had a BM. She denies any pain or discomfort. [facility] services reviewed and discussed. Patient denies any needs at this time and does not wish for any follow up calls stating her admission was a scheduled surgery and she is doing well. She was provided with [facility]/RN CM contact info to call for any needs or concerns.     Plan: RN CM will close referral at this time.  Enzo Montgomery, RN,BSN,CCM [place] Management Telephonic Care Management Coordinator Direct Phone: [phone] Toll Free: [phone] Fax: [phone]

## 2020-12-04 ENCOUNTER — Ambulatory Visit: Payer: Self-pay

## 2020-12-06 DIAGNOSIS — Z0001 Encounter for general adult medical examination with abnormal findings: Secondary | ICD-10-CM | POA: Diagnosis not present

## 2020-12-06 DIAGNOSIS — F419 Anxiety disorder, unspecified: Secondary | ICD-10-CM | POA: Diagnosis not present

## 2020-12-06 DIAGNOSIS — Z136 Encounter for screening for cardiovascular disorders: Secondary | ICD-10-CM | POA: Diagnosis not present

## 2020-12-06 DIAGNOSIS — E039 Hypothyroidism, unspecified: Secondary | ICD-10-CM | POA: Diagnosis not present

## 2020-12-06 DIAGNOSIS — E1165 Type 2 diabetes mellitus with hyperglycemia: Secondary | ICD-10-CM | POA: Diagnosis not present

## 2020-12-06 DIAGNOSIS — I10 Essential (primary) hypertension: Secondary | ICD-10-CM | POA: Diagnosis not present

## 2020-12-06 DIAGNOSIS — N1832 Chronic kidney disease, stage 3b: Secondary | ICD-10-CM | POA: Diagnosis not present

## 2020-12-06 DIAGNOSIS — K219 Gastro-esophageal reflux disease without esophagitis: Secondary | ICD-10-CM | POA: Diagnosis not present

## 2020-12-13 DIAGNOSIS — Z9884 Bariatric surgery status: Secondary | ICD-10-CM | POA: Insufficient documentation

## 2020-12-28 DIAGNOSIS — N6489 Other specified disorders of breast: Secondary | ICD-10-CM | POA: Insufficient documentation

## 2021-01-04 ENCOUNTER — Other Ambulatory Visit: Payer: Self-pay | Admitting: Obstetrics and Gynecology

## 2021-01-04 DIAGNOSIS — R928 Other abnormal and inconclusive findings on diagnostic imaging of breast: Secondary | ICD-10-CM

## 2021-01-17 DIAGNOSIS — R300 Dysuria: Secondary | ICD-10-CM | POA: Diagnosis not present

## 2021-01-22 ENCOUNTER — Ambulatory Visit: Payer: Medicare Other

## 2021-01-22 ENCOUNTER — Ambulatory Visit
Admission: RE | Admit: 2021-01-22 | Discharge: 2021-01-22 | Disposition: A | Discharge status: Home / Self Care | Payer: Medicare HMO | Source: Ambulatory Visit | Attending: Obstetrics and Gynecology | Admitting: Obstetrics and Gynecology

## 2021-01-22 ENCOUNTER — Other Ambulatory Visit: Payer: Self-pay

## 2021-01-22 DIAGNOSIS — R928 Other abnormal and inconclusive findings on diagnostic imaging of breast: Secondary | ICD-10-CM

## 2021-02-06 DIAGNOSIS — N184 Chronic kidney disease, stage 4 (severe): Secondary | ICD-10-CM | POA: Diagnosis not present

## 2021-02-13 DIAGNOSIS — R058 Other specified cough: Secondary | ICD-10-CM | POA: Diagnosis not present

## 2021-02-13 DIAGNOSIS — M069 Rheumatoid arthritis, unspecified: Secondary | ICD-10-CM | POA: Diagnosis not present

## 2021-02-13 DIAGNOSIS — N2581 Secondary hyperparathyroidism of renal origin: Secondary | ICD-10-CM | POA: Diagnosis not present

## 2021-02-13 DIAGNOSIS — T464X5A Adverse effect of angiotensin-converting-enzyme inhibitors, initial encounter: Secondary | ICD-10-CM | POA: Diagnosis not present

## 2021-02-13 DIAGNOSIS — R809 Proteinuria, unspecified: Secondary | ICD-10-CM | POA: Diagnosis not present

## 2021-02-13 DIAGNOSIS — Z903 Acquired absence of stomach [part of]: Secondary | ICD-10-CM | POA: Diagnosis not present

## 2021-02-13 DIAGNOSIS — I120 Hypertensive chronic kidney disease with stage 5 chronic kidney disease or end stage renal disease: Secondary | ICD-10-CM | POA: Diagnosis not present

## 2021-02-13 DIAGNOSIS — N185 Chronic kidney disease, stage 5: Secondary | ICD-10-CM | POA: Diagnosis not present

## 2021-02-27 DIAGNOSIS — J3089 Other allergic rhinitis: Secondary | ICD-10-CM | POA: Diagnosis not present

## 2021-02-27 DIAGNOSIS — Z9884 Bariatric surgery status: Secondary | ICD-10-CM | POA: Diagnosis not present

## 2021-02-27 DIAGNOSIS — M069 Rheumatoid arthritis, unspecified: Secondary | ICD-10-CM | POA: Diagnosis not present

## 2021-02-27 DIAGNOSIS — N184 Chronic kidney disease, stage 4 (severe): Secondary | ICD-10-CM | POA: Diagnosis not present

## 2021-02-27 DIAGNOSIS — K219 Gastro-esophageal reflux disease without esophagitis: Secondary | ICD-10-CM | POA: Diagnosis not present

## 2021-02-27 DIAGNOSIS — E039 Hypothyroidism, unspecified: Secondary | ICD-10-CM | POA: Diagnosis not present

## 2021-02-27 DIAGNOSIS — M25552 Pain in left hip: Secondary | ICD-10-CM | POA: Diagnosis not present

## 2021-02-28 DIAGNOSIS — E039 Hypothyroidism, unspecified: Secondary | ICD-10-CM | POA: Diagnosis not present

## 2021-02-28 DIAGNOSIS — K912 Postsurgical malabsorption, not elsewhere classified: Secondary | ICD-10-CM | POA: Diagnosis not present

## 2021-03-14 DIAGNOSIS — N185 Chronic kidney disease, stage 5: Secondary | ICD-10-CM | POA: Diagnosis not present

## 2021-03-20 DIAGNOSIS — Z903 Acquired absence of stomach [part of]: Secondary | ICD-10-CM | POA: Diagnosis not present

## 2021-03-20 DIAGNOSIS — N2581 Secondary hyperparathyroidism of renal origin: Secondary | ICD-10-CM | POA: Diagnosis not present

## 2021-03-20 DIAGNOSIS — I120 Hypertensive chronic kidney disease with stage 5 chronic kidney disease or end stage renal disease: Secondary | ICD-10-CM | POA: Diagnosis not present

## 2021-03-20 DIAGNOSIS — T464X5A Adverse effect of angiotensin-converting-enzyme inhibitors, initial encounter: Secondary | ICD-10-CM | POA: Diagnosis not present

## 2021-03-20 DIAGNOSIS — M069 Rheumatoid arthritis, unspecified: Secondary | ICD-10-CM | POA: Diagnosis not present

## 2021-03-20 DIAGNOSIS — N185 Chronic kidney disease, stage 5: Secondary | ICD-10-CM | POA: Diagnosis not present

## 2021-03-20 DIAGNOSIS — R058 Other specified cough: Secondary | ICD-10-CM | POA: Diagnosis not present

## 2021-03-20 DIAGNOSIS — R809 Proteinuria, unspecified: Secondary | ICD-10-CM | POA: Diagnosis not present

## 2021-03-27 DIAGNOSIS — Z713 Dietary counseling and surveillance: Secondary | ICD-10-CM | POA: Diagnosis not present

## 2021-03-27 DIAGNOSIS — R799 Abnormal finding of blood chemistry, unspecified: Secondary | ICD-10-CM | POA: Diagnosis not present

## 2021-03-27 DIAGNOSIS — E569 Vitamin deficiency, unspecified: Secondary | ICD-10-CM | POA: Diagnosis not present

## 2021-03-27 DIAGNOSIS — Z903 Acquired absence of stomach [part of]: Secondary | ICD-10-CM | POA: Diagnosis not present

## 2021-03-27 DIAGNOSIS — Z9884 Bariatric surgery status: Secondary | ICD-10-CM | POA: Diagnosis not present

## 2021-04-03 ENCOUNTER — Encounter (HOSPITAL_COMMUNITY): Payer: Medicare Other

## 2021-04-06 ENCOUNTER — Encounter (HOSPITAL_COMMUNITY)
Admission: RE | Admit: 2021-04-06 | Discharge: 2021-04-06 | Disposition: A | Discharge status: Home / Self Care | Payer: Medicare Other | Source: Ambulatory Visit | Attending: Nephrology | Admitting: Nephrology

## 2021-04-06 ENCOUNTER — Other Ambulatory Visit: Payer: Self-pay

## 2021-04-06 VITALS — BP 148/69 | HR 78 | Temp 97.1°F | Resp 18

## 2021-04-06 DIAGNOSIS — N185 Chronic kidney disease, stage 5: Secondary | ICD-10-CM | POA: Insufficient documentation

## 2021-04-06 DIAGNOSIS — D649 Anemia, unspecified: Secondary | ICD-10-CM

## 2021-04-06 DIAGNOSIS — D631 Anemia in chronic kidney disease: Secondary | ICD-10-CM | POA: Insufficient documentation

## 2021-04-06 LAB — POCT HEMOGLOBIN-HEMACUE: Hemoglobin: 8.9 g/dL — ABNORMAL LOW (ref 12.0–15.0)

## 2021-04-06 MED ORDER — EPOETIN ALFA-EPBX 10000 UNIT/ML IJ SOLN
20000.0000 [IU] | INTRAMUSCULAR | Status: DC
  Administered 2021-04-06: 20000 [IU] via SUBCUTANEOUS

## 2021-04-06 MED ORDER — EPOETIN ALFA-EPBX 10000 UNIT/ML IJ SOLN
INTRAMUSCULAR | Status: AC
  Filled 2021-04-06: qty 2

## 2021-04-13 ENCOUNTER — Other Ambulatory Visit: Payer: Self-pay

## 2021-04-13 DIAGNOSIS — E559 Vitamin D deficiency, unspecified: Secondary | ICD-10-CM | POA: Insufficient documentation

## 2021-04-13 DIAGNOSIS — N289 Disorder of kidney and ureter, unspecified: Secondary | ICD-10-CM

## 2021-04-13 DIAGNOSIS — N951 Menopausal and female climacteric states: Secondary | ICD-10-CM | POA: Insufficient documentation

## 2021-04-24 ENCOUNTER — Ambulatory Visit (HOSPITAL_COMMUNITY)
Admission: RE | Admit: 2021-04-24 | Discharge: 2021-04-24 | Disposition: A | Discharge status: Home / Self Care | Payer: Medicare Other | Source: Ambulatory Visit | Attending: Vascular Surgery | Admitting: Vascular Surgery

## 2021-04-24 ENCOUNTER — Ambulatory Visit (INDEPENDENT_AMBULATORY_CARE_PROVIDER_SITE_OTHER)
Admission: RE | Admit: 2021-04-24 | Discharge: 2021-04-24 | Disposition: A | Discharge status: Home / Self Care | Payer: Medicare Other | Source: Ambulatory Visit | Attending: Vascular Surgery | Admitting: Vascular Surgery

## 2021-04-24 ENCOUNTER — Ambulatory Visit (INDEPENDENT_AMBULATORY_CARE_PROVIDER_SITE_OTHER): Payer: Medicare Other | Admitting: Vascular Surgery

## 2021-04-24 ENCOUNTER — Other Ambulatory Visit: Payer: Self-pay

## 2021-04-24 DIAGNOSIS — N289 Disorder of kidney and ureter, unspecified: Secondary | ICD-10-CM | POA: Diagnosis not present

## 2021-04-24 DIAGNOSIS — N185 Chronic kidney disease, stage 5: Secondary | ICD-10-CM

## 2021-04-24 NOTE — Progress Notes (Signed)
Patient name: Elizabeth Griffin MRN: [id number] DOB: 1960/07/01 Sex: female  REASON FOR CONSULT: Evaluate for permanent hemodialysis access  HPI: Elizabeth Griffin is a 60 y.o. female, with history of hypertension and stage V CKD and rheumatoid arthritis that presents for evaluation of permanent hemodialysis access.  Patient is followed by Dr. Joelyn Oms with [facility].  His notes indicate she is stage V CKD.  She has not started dialysis at this time.  She is right-handed.  She has been referred to [facility] for evaluation of transplant.  No chest wall implants.  No previous access in the past.  Past Medical History:  Diagnosis Date   Allergic rhinitis    sees allergist   Anemia    sickle cell trait, saw hematology   Anxiety    BV (bacterial vaginosis)    CKD (chronic kidney disease)    Diverticulosis    Endometrial mass    benign   GERD (gastroesophageal reflux disease)    Hydradenitis    sees Dr. Ninfa Linden, s/p multiple surgeries, on disability   Hypertension    Obesity    Rheumatoid arthritis ([place])    sees Dr. Trudie Reed   Vitamin D deficiency     Past Surgical History:  Procedure Laterality Date   AXILLARY HIDRADENITIS EXCISION     right - 04/22/2006; left - 01/02/2009   CHOLECYSTECTOMY     HYDRADENITIS EXCISION  07/31/2011   Procedure: EXCISION HYDRADENITIS GROIN;  Surgeon: Harl Bowie, MD;  Location: [facility];  Service: General;  Laterality: Right;   HYSTEROSCOPY W/ ENDOMETRIAL ABLATION  11/23/2009   and D & C   HYSTEROSCOPY WITH D & C  07/15/2008   PERINEAL HIDRADENITIS EXCISION  01/23/2011   right buttock    Family History  Problem Relation Age of Onset   Hypertension Brother    Cancer Son        leukemia    SOCIAL HISTORY: Social History   Socioeconomic History   Marital status: Widowed    Spouse name: Not on file   Number of children: Not on file   Years of education: Not on file   Highest education level: Not on file   Occupational History   Not on file  Tobacco Use   Smoking status: Never   Smokeless tobacco: Never  Substance and Sexual Activity   Alcohol use: No   Drug use: No   Sexual activity: Not Currently    Birth control/protection: Abstinence  Other Topics Concern   Not on file  Social History Narrative   Work or School: on disability      Home Situation: takes two foster kids and her son       Spiritual Beliefs: Theme park manager, 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      Lifestyle: goes to the gym 3 days per week; diet is so so      Social Determinants of Radio broadcast assistant Strain: Not on file  Food Insecurity: Not on file  Transportation Needs: Not on file  Physical Activity: Not on file  Stress: Not on file  Social Connections: Not on file  Intimate Partner Violence: Not on file    Allergies  Allergen Reactions   Adhesive [Tape] Hives   Bactrim [Sulfamethoxazole-Trimethoprim] Nausea And Vomiting   Ibuprofen     Causes ulcer    Lisinopril     dehydration   Shellfish Allergy Swelling    Current Outpatient Medications  Medication Sig Dispense Refill  ALPRAZolam (XANAX PO) Xanax     cetirizine HCl (ZYRTEC) 1 MG/ML solution Take by mouth daily.     Cholecalciferol (VITAMIN D3 PO) Take by mouth.     Estradiol 10 % CREA COMPOUNDED MEDICATION  Estradiol Vaginal Cream in Versa Base 0.02%/mL  apply 1 mL with finger, every other day for 10 days then twice weekly thereafter     ferrous sulfate 325 (65 FE) MG tablet Take 1 tablet (325 mg total) by mouth daily with breakfast. 90 tablet 0   Fluticasone Propionate, Inhal, 50 MCG/ACT AEPB See admin instructions.     golimumab (SIMPONI ARIA) 50 MG/4ML SOLN injection Inject into the vein.     pantoprazole (PROTONIX) 40 MG tablet Take 1 tablet (40 mg total) by mouth daily. TAKE 1 TABLET BY MOUTH EVERY DAY 90 tablet 3   thyroid (ARMOUR) 30 MG tablet Take 30 mg by mouth daily before breakfast.     amLODipine (NORVASC) 5 MG tablet Take 1  tablet (5 mg total) by mouth daily. TAKE 1 TABLET BY MOUTH EVERY DAY (Patient not taking: Reported on 04/24/2021) 90 tablet 3   clindamycin (CLEOCIN T) 1 % lotion Apply topically 2 (two) times daily. (Patient not taking: Reported on 04/24/2021)     diphenhydrAMINE (BENADRYL) 25 MG tablet Take 50 mg by mouth at bedtime. (Patient not taking: Reported on 54/27/0623)     FOLIC ACID PO Take by mouth. (Patient not taking: Reported on 04/24/2021)     methotrexate (RHEUMATREX) 2.5 MG tablet Take by mouth. (Patient not taking: Reported on 04/24/2021)     No current facility-administered medications for this visit.    REVIEW OF SYSTEMS:  [X]  denotes positive finding, [ ]  denotes negative finding Cardiac  Comments:  Chest pain or chest pressure:    Shortness of breath upon exertion:    Short of breath when lying flat:    Irregular heart rhythm:        Vascular    Pain in calf, thigh, or hip brought on by ambulation:    Pain in feet at night that wakes you up from your sleep:     Blood clot in your veins:    Leg swelling:         Pulmonary    Oxygen at home:    Productive cough:     Wheezing:         Neurologic    Sudden weakness in arms or legs:     Sudden numbness in arms or legs:     Sudden onset of difficulty speaking or slurred speech:    Temporary loss of vision in one eye:     Problems with dizziness:         Gastrointestinal    Blood in stool:     Vomited blood:         Genitourinary    Burning when urinating:     Blood in urine:        Psychiatric    Major depression:         Hematologic    Bleeding problems:    Problems with blood clotting too easily:        Skin    Rashes or ulcers:        Constitutional    Fever or chills:      PHYSICAL EXAM: Vitals:   04/24/21 1057  BP: (!) 150/83  Pulse: 73  Resp: 20  Temp: 97.8 F (36.6 C)  TempSrc: Temporal  Weight: 242  lb (109.8 kg)  Height: 5\' 3"  (1.6 m)    GENERAL: The patient is a well-nourished female, in  no acute distress. The vital signs are documented above. CARDIAC: There is a regular rate and rhythm.  VASCULAR:  Palpable radial and brachial pulses bilateral upper extremities No chest wall implants PULMONARY: No respiratory distress. ABDOMEN: Soft and non-tender. MUSCULOSKELETAL: There are no major deformities or cyanosis. NEUROLOGIC: No focal weakness or paresthesias are detected. SKIN: There are no ulcers or rashes noted. PSYCHIATRIC: The patient has a normal affect.  DATA:   Upper extremity arterial duplex showed triphasic waveform in both upper extremity  Upper extremity vein mapping shows an excellent cephalic and basilic vein in the left arm which is the nondominant arm  Assessment/Plan:  60 year old female with stage V CKD that presents for evaluation of permanent hemodialysis access.  Discussed plan for placement in the left arm given she is right-hand dominant.  In review of the vein mapping, she has a nice cephalic as well as basilic vein in the left arm with easily palpable pules.  Discussed would likely be brachiocephalic at the elbow.  Discussed this will take up to 3 months to mature after access is placed prior to being usable.  Risk benefits discussed including risk of bleeding, infection, failure to mature, steal.  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  We will hopefully get her scheduled for December.   Marty Heck, MD [facility] Office: [phone]

## 2021-04-26 ENCOUNTER — Other Ambulatory Visit: Payer: Self-pay

## 2021-05-02 DIAGNOSIS — H5213 Myopia, bilateral: Secondary | ICD-10-CM | POA: Diagnosis not present

## 2021-05-04 ENCOUNTER — Ambulatory Visit (HOSPITAL_COMMUNITY)
Admission: RE | Admit: 2021-05-04 | Discharge: 2021-05-04 | Disposition: A | Discharge status: Home / Self Care | Payer: Medicare Other | Source: Ambulatory Visit | Attending: Nephrology | Admitting: Nephrology

## 2021-05-04 VITALS — BP 141/88 | HR 86 | Temp 97.8°F | Resp 18

## 2021-05-04 DIAGNOSIS — N185 Chronic kidney disease, stage 5: Secondary | ICD-10-CM | POA: Diagnosis not present

## 2021-05-04 DIAGNOSIS — D631 Anemia in chronic kidney disease: Secondary | ICD-10-CM | POA: Diagnosis not present

## 2021-05-04 DIAGNOSIS — D649 Anemia, unspecified: Secondary | ICD-10-CM

## 2021-05-04 LAB — RENAL FUNCTION PANEL
Albumin: 3.2 g/dL — ABNORMAL LOW (ref 3.5–5.0)
Anion gap: 6 (ref 5–15)
BUN: 35 mg/dL — ABNORMAL HIGH (ref 6–20)
CO2: 22 mmol/L (ref 22–32)
Calcium: 8.7 mg/dL — ABNORMAL LOW (ref 8.9–10.3)
Chloride: 111 mmol/L (ref 98–111)
Creatinine, Ser: 4.19 mg/dL — ABNORMAL HIGH (ref 0.44–1.00)
GFR, Estimated: 12 mL/min — ABNORMAL LOW (ref >60)
Glucose, Bld: 89 mg/dL (ref 70–99)
Phosphorus: 4.8 mg/dL — ABNORMAL HIGH (ref 2.5–4.6)
Potassium: 5.1 mmol/L (ref 3.5–5.1)
Sodium: 139 mmol/L (ref 135–145)

## 2021-05-04 LAB — POCT HEMOGLOBIN-HEMACUE: Hemoglobin: 9.3 g/dL — ABNORMAL LOW (ref 12.0–15.0)

## 2021-05-04 LAB — IRON AND TIBC
Iron: 62 ug/dL (ref 28–170)
Saturation Ratios: 23 % (ref 10.4–31.8)
TIBC: 273 ug/dL (ref 250–450)
UIBC: 211 ug/dL

## 2021-05-04 MED ORDER — EPOETIN ALFA-EPBX 10000 UNIT/ML IJ SOLN
INTRAMUSCULAR | Status: AC
  Administered 2021-05-04: 20000 [IU] via SUBCUTANEOUS
  Filled 2021-05-04: qty 2

## 2021-05-04 MED ORDER — EPOETIN ALFA-EPBX 10000 UNIT/ML IJ SOLN: 20000.0000 [IU] | INTRAMUSCULAR | Status: DC

## 2021-05-10 DIAGNOSIS — N189 Chronic kidney disease, unspecified: Secondary | ICD-10-CM | POA: Diagnosis not present

## 2021-05-10 DIAGNOSIS — Z1211 Encounter for screening for malignant neoplasm of colon: Secondary | ICD-10-CM | POA: Diagnosis not present

## 2021-05-10 DIAGNOSIS — Z9889 Other specified postprocedural states: Secondary | ICD-10-CM | POA: Diagnosis not present

## 2021-05-29 DIAGNOSIS — R809 Proteinuria, unspecified: Secondary | ICD-10-CM | POA: Diagnosis not present

## 2021-05-29 DIAGNOSIS — Z903 Acquired absence of stomach [part of]: Secondary | ICD-10-CM | POA: Diagnosis not present

## 2021-05-29 DIAGNOSIS — I120 Hypertensive chronic kidney disease with stage 5 chronic kidney disease or end stage renal disease: Secondary | ICD-10-CM | POA: Diagnosis not present

## 2021-05-29 DIAGNOSIS — R058 Other specified cough: Secondary | ICD-10-CM | POA: Diagnosis not present

## 2021-05-29 DIAGNOSIS — D631 Anemia in chronic kidney disease: Secondary | ICD-10-CM | POA: Diagnosis not present

## 2021-05-29 DIAGNOSIS — T464X5A Adverse effect of angiotensin-converting-enzyme inhibitors, initial encounter: Secondary | ICD-10-CM | POA: Diagnosis not present

## 2021-05-29 DIAGNOSIS — N185 Chronic kidney disease, stage 5: Secondary | ICD-10-CM | POA: Diagnosis not present

## 2021-05-29 DIAGNOSIS — N2581 Secondary hyperparathyroidism of renal origin: Secondary | ICD-10-CM | POA: Diagnosis not present

## 2021-05-29 DIAGNOSIS — M069 Rheumatoid arthritis, unspecified: Secondary | ICD-10-CM | POA: Diagnosis not present

## 2021-05-30 DIAGNOSIS — B379 Candidiasis, unspecified: Secondary | ICD-10-CM | POA: Diagnosis not present

## 2021-05-30 DIAGNOSIS — Z23 Encounter for immunization: Secondary | ICD-10-CM | POA: Diagnosis not present

## 2021-05-30 DIAGNOSIS — N185 Chronic kidney disease, stage 5: Secondary | ICD-10-CM | POA: Diagnosis not present

## 2021-05-30 DIAGNOSIS — K219 Gastro-esophageal reflux disease without esophagitis: Secondary | ICD-10-CM | POA: Diagnosis not present

## 2021-05-30 DIAGNOSIS — T3695XA Adverse effect of unspecified systemic antibiotic, initial encounter: Secondary | ICD-10-CM | POA: Diagnosis not present

## 2021-06-01 ENCOUNTER — Other Ambulatory Visit: Payer: Self-pay

## 2021-06-01 ENCOUNTER — Encounter (HOSPITAL_COMMUNITY)
Admission: RE | Admit: 2021-06-01 | Discharge: 2021-06-01 | Disposition: A | Discharge status: Home / Self Care | Payer: Medicare Other | Source: Ambulatory Visit | Attending: Nephrology | Admitting: Nephrology

## 2021-06-01 VITALS — BP 134/89 | HR 77 | Temp 97.4°F | Resp 18

## 2021-06-01 DIAGNOSIS — D649 Anemia, unspecified: Secondary | ICD-10-CM

## 2021-06-01 DIAGNOSIS — D631 Anemia in chronic kidney disease: Secondary | ICD-10-CM | POA: Diagnosis not present

## 2021-06-01 DIAGNOSIS — N185 Chronic kidney disease, stage 5: Secondary | ICD-10-CM | POA: Diagnosis not present

## 2021-06-01 LAB — IRON AND TIBC
Iron: 54 ug/dL (ref 28–170)
Saturation Ratios: 20 % (ref 10.4–31.8)
TIBC: 270 ug/dL (ref 250–450)
UIBC: 216 ug/dL

## 2021-06-01 LAB — POCT HEMOGLOBIN-HEMACUE: Hemoglobin: 9.3 g/dL — ABNORMAL LOW (ref 12.0–15.0)

## 2021-06-01 LAB — RENAL FUNCTION PANEL
Albumin: 3.2 g/dL — ABNORMAL LOW (ref 3.5–5.0)
Anion gap: 6 (ref 5–15)
BUN: 33 mg/dL — ABNORMAL HIGH (ref 6–20)
CO2: 23 mmol/L (ref 22–32)
Calcium: 8.9 mg/dL (ref 8.9–10.3)
Chloride: 110 mmol/L (ref 98–111)
Creatinine, Ser: 4.23 mg/dL — ABNORMAL HIGH (ref 0.44–1.00)
GFR, Estimated: 11 mL/min — ABNORMAL LOW (ref >60)
Glucose, Bld: 92 mg/dL (ref 70–99)
Phosphorus: 4.2 mg/dL (ref 2.5–4.6)
Potassium: 4.4 mmol/L (ref 3.5–5.1)
Sodium: 139 mmol/L (ref 135–145)

## 2021-06-01 MED ORDER — EPOETIN ALFA-EPBX 10000 UNIT/ML IJ SOLN
INTRAMUSCULAR | Status: AC
  Filled 2021-06-01: qty 2

## 2021-06-01 MED ORDER — EPOETIN ALFA-EPBX 10000 UNIT/ML IJ SOLN
20000.0000 [IU] | INTRAMUSCULAR | Status: DC
  Administered 2021-06-01: 09:00:00 20000 [IU] via SUBCUTANEOUS

## 2021-06-06 ENCOUNTER — Encounter (HOSPITAL_COMMUNITY): Payer: Self-pay | Admitting: Vascular Surgery

## 2021-06-06 ENCOUNTER — Other Ambulatory Visit: Payer: Self-pay

## 2021-06-06 DIAGNOSIS — H5203 Hypermetropia, bilateral: Secondary | ICD-10-CM | POA: Diagnosis not present

## 2021-06-06 NOTE — Progress Notes (Signed)
PCP - Dr. Lorene Dy- [place]  Cardiologist - Denies  EP- Denies  Endocrine- Denies  Pulm- Denies  Chest x-ray - Denies  EKG - 06/08/21- Day of Surgery  Stress Test - 05/31/21 (E)  ECHO - (E)  Cardiac Cath - Denies  AICD-na PM-na LOOP-na  Dialysis- Denies  Sleep Study - Denies CPAP - Denies  LABS- 06/08/21: I- Stat-8  ASA- Denies  ERAS- No  HA1C- Denies  Anesthesia- No  Pt denies having chest pain, sob, or fever during the pre-op phone call. All instructions explained to the pt, with a verbal understanding of the material including: As of today, stop taking all Aspirin (unless instructed by your doctor) and Other Aspirin containing products, Vitamins, Fish oils, and Herbal medications. Also stop all NSAIDS i.e. Advil, Ibuprofen, Motrin, Aleve, Anaprox, Naproxen, BC, Goody Powders, and all Supplements. Pt also instructed to wear a mask and social distance if she goes out. The opportunity to ask questions was provided.    Coronavirus Screening  Have you experienced the following symptoms:  Cough yes/no: No Fever (>100.13F)  yes/no: No Runny nose yes/no: No Sore throat yes/no: No Difficulty breathing/shortness of breath  yes/no: No  Have you or a family member traveled in the last 14 days and where? yes/no: No   If the patient indicates "YES" to the above questions, their PAT will be rescheduled to limit the exposure to others and, the surgeon will be notified. THE PATIENT WILL NEED TO BE ASYMPTOMATIC FOR 14 DAYS.   If the patient is not experiencing any of these symptoms, the PAT nurse will instruct them to NOT bring anyone with them to their appointment since they may have these symptoms or traveled as well.   Please remind your patients and families that hospital visitation restrictions are in effect and the importance of the restrictions.

## 2021-06-07 NOTE — Anesthesia Preprocedure Evaluation (Addendum)
Anesthesia Evaluation  Patient identified by MRN, date of birth, ID band Patient awake    Reviewed: Allergy & Precautions, NPO status , Patient's Chart, lab work & pertinent test results  History of Anesthesia Complications Negative for: history of anesthetic complications  Airway Mallampati: II  TM Distance: >3 FB Neck ROM: Full    Dental  (+) Teeth Intact   Pulmonary neg pulmonary ROS,    Pulmonary exam normal        Cardiovascular hypertension, Normal cardiovascular exam     Neuro/Psych negative neurological ROS     GI/Hepatic Neg liver ROS, GERD  ,  Endo/Other  Morbid obesity  Renal/GU CRFRenal disease (CKD V)  negative genitourinary   Musculoskeletal  (+) Arthritis , Rheumatoid disorders,    Abdominal   Peds  Hematology  (+) anemia ,   Anesthesia Other Findings   Reproductive/Obstetrics                            Anesthesia Physical Anesthesia Plan  ASA: 3  Anesthesia Plan: MAC and Regional   Post-op Pain Management: Regional block and Tylenol PO (pre-op)   Induction: Intravenous  PONV Risk Score and Plan: 2 and Propofol infusion, TIVA and Treatment may vary due to age or medical condition  Airway Management Planned: Natural Airway, Nasal Cannula and Simple Face Mask  Additional Equipment: None  Intra-op Plan:   Post-operative Plan:   Informed Consent: I have reviewed the patients History and Physical, chart, labs and discussed the procedure including the risks, benefits and alternatives for the proposed anesthesia with the patient or authorized representative who has indicated his/her understanding and acceptance.       Plan Discussed with:   Anesthesia Plan Comments:        Anesthesia Quick Evaluation

## 2021-06-08 ENCOUNTER — Encounter (HOSPITAL_COMMUNITY): Payer: Self-pay | Admitting: Vascular Surgery

## 2021-06-08 ENCOUNTER — Other Ambulatory Visit: Payer: Self-pay

## 2021-06-08 ENCOUNTER — Ambulatory Visit (HOSPITAL_COMMUNITY): Payer: Medicare Other | Admitting: Anesthesiology

## 2021-06-08 ENCOUNTER — Encounter (HOSPITAL_COMMUNITY)
Admission: RE | Disposition: A | Discharge status: Home / Self Care | Payer: Self-pay | Source: Ambulatory Visit | Attending: Vascular Surgery

## 2021-06-08 ENCOUNTER — Ambulatory Visit (HOSPITAL_COMMUNITY)
Admission: RE | Admit: 2021-06-08 | Discharge: 2021-06-08 | Disposition: A | Discharge status: Home / Self Care | Payer: Medicare Other | Source: Ambulatory Visit | Attending: Vascular Surgery | Admitting: Vascular Surgery

## 2021-06-08 DIAGNOSIS — D631 Anemia in chronic kidney disease: Secondary | ICD-10-CM | POA: Insufficient documentation

## 2021-06-08 DIAGNOSIS — N185 Chronic kidney disease, stage 5: Secondary | ICD-10-CM | POA: Diagnosis not present

## 2021-06-08 DIAGNOSIS — D573 Sickle-cell trait: Secondary | ICD-10-CM | POA: Diagnosis not present

## 2021-06-08 DIAGNOSIS — Z6841 Body Mass Index (BMI) 40.0 and over, adult: Secondary | ICD-10-CM | POA: Insufficient documentation

## 2021-06-08 DIAGNOSIS — E559 Vitamin D deficiency, unspecified: Secondary | ICD-10-CM | POA: Diagnosis not present

## 2021-06-08 DIAGNOSIS — I120 Hypertensive chronic kidney disease with stage 5 chronic kidney disease or end stage renal disease: Secondary | ICD-10-CM | POA: Insufficient documentation

## 2021-06-08 HISTORY — PX: AV FISTULA PLACEMENT: SHX1204

## 2021-06-08 HISTORY — DX: Other complications of anesthesia, initial encounter: T88.59XA

## 2021-06-08 LAB — POCT I-STAT, CHEM 8
BUN: 44 mg/dL — ABNORMAL HIGH (ref 6–20)
Calcium, Ion: 1.18 mmol/L (ref 1.15–1.40)
Chloride: 111 mmol/L (ref 98–111)
Creatinine, Ser: 4.5 mg/dL — ABNORMAL HIGH (ref 0.44–1.00)
Glucose, Bld: 83 mg/dL (ref 70–99)
HCT: 31 % — ABNORMAL LOW (ref 36.0–46.0)
Hemoglobin: 10.5 g/dL — ABNORMAL LOW (ref 12.0–15.0)
Potassium: 4.7 mmol/L (ref 3.5–5.1)
Sodium: 143 mmol/L (ref 135–145)
TCO2: 24 mmol/L (ref 22–32)

## 2021-06-08 SURGERY — ARTERIOVENOUS (AV) FISTULA CREATION
Anesthesia: Monitor Anesthesia Care | Site: Arm Lower | Laterality: Left

## 2021-06-08 MED ORDER — MIDAZOLAM HCL 2 MG/2ML IJ SOLN
INTRAMUSCULAR | Status: AC
  Filled 2021-06-08: qty 2

## 2021-06-08 MED ORDER — CHLORHEXIDINE GLUCONATE 0.12 % MT SOLN
OROMUCOSAL | Status: AC
  Administered 2021-06-08: 06:00:00 15 mL via OROMUCOSAL
  Filled 2021-06-08: qty 15

## 2021-06-08 MED ORDER — PROPOFOL 500 MG/50ML IV EMUL
INTRAVENOUS | Status: DC | PRN
  Administered 2021-06-08: 80 ug/kg/min via INTRAVENOUS

## 2021-06-08 MED ORDER — MIDAZOLAM HCL 2 MG/2ML IJ SOLN
INTRAMUSCULAR | Status: DC | PRN
  Administered 2021-06-08: .5 mg via INTRAVENOUS
  Administered 2021-06-08: 1.5 mg via INTRAVENOUS

## 2021-06-08 MED ORDER — FENTANYL CITRATE (PF) 250 MCG/5ML IJ SOLN
INTRAMUSCULAR | Status: DC | PRN
  Administered 2021-06-08: 100 ug via INTRAVENOUS

## 2021-06-08 MED ORDER — ONDANSETRON HCL 4 MG/2ML IJ SOLN: 4.0000 mg | Freq: Once | INTRAMUSCULAR | Status: DC | PRN

## 2021-06-08 MED ORDER — FENTANYL CITRATE (PF) 100 MCG/2ML IJ SOLN: 25.0000 ug | INTRAMUSCULAR | Status: DC | PRN

## 2021-06-08 MED ORDER — 0.9 % SODIUM CHLORIDE (POUR BTL) OPTIME
TOPICAL | Status: DC | PRN
  Administered 2021-06-08: 07:00:00 1000 mL

## 2021-06-08 MED ORDER — CEFAZOLIN SODIUM-DEXTROSE 2-4 GM/100ML-% IV SOLN
2.0000 g | INTRAVENOUS | Status: AC
  Administered 2021-06-08: 08:00:00 2 g via INTRAVENOUS
  Filled 2021-06-08: qty 100

## 2021-06-08 MED ORDER — LIDOCAINE-EPINEPHRINE (PF) 1.5 %-1:200000 IJ SOLN
INTRAMUSCULAR | Status: DC | PRN
  Administered 2021-06-08: 15 mL via PERINEURAL
  Administered 2021-06-08: 5 mL via PERINEURAL

## 2021-06-08 MED ORDER — SODIUM CHLORIDE 0.9 % IV SOLN: INTRAVENOUS | Status: DC

## 2021-06-08 MED ORDER — CHLORHEXIDINE GLUCONATE 4 % EX LIQD: 60.0000 mL | Freq: Once | CUTANEOUS | Status: DC

## 2021-06-08 MED ORDER — ONDANSETRON HCL 4 MG/2ML IJ SOLN
INTRAMUSCULAR | Status: AC
  Filled 2021-06-08: qty 2

## 2021-06-08 MED ORDER — HYDROCODONE-ACETAMINOPHEN 5-325 MG PO TABS: 1.0000 | ORAL_TABLET | Freq: Four times a day (QID) | ORAL | 0 refills | Status: DC | PRN

## 2021-06-08 MED ORDER — LIDOCAINE 2% (20 MG/ML) 5 ML SYRINGE
INTRAMUSCULAR | Status: DC | PRN
  Administered 2021-06-08: 40 mg via INTRAVENOUS

## 2021-06-08 MED ORDER — PAPAVERINE HCL 30 MG/ML IJ SOLN
INTRAMUSCULAR | Status: AC
  Filled 2021-06-08: qty 2

## 2021-06-08 MED ORDER — OXYCODONE HCL 5 MG PO TABS: 5.0000 mg | ORAL_TABLET | Freq: Once | ORAL | Status: DC | PRN

## 2021-06-08 MED ORDER — FENTANYL CITRATE (PF) 250 MCG/5ML IJ SOLN
INTRAMUSCULAR | Status: AC
  Filled 2021-06-08: qty 5

## 2021-06-08 MED ORDER — LIDOCAINE 2% (20 MG/ML) 5 ML SYRINGE
INTRAMUSCULAR | Status: AC
  Filled 2021-06-08: qty 5

## 2021-06-08 MED ORDER — PROPOFOL 10 MG/ML IV BOLUS
INTRAVENOUS | Status: DC | PRN
  Administered 2021-06-08: 25 mg via INTRAVENOUS

## 2021-06-08 MED ORDER — HEPARIN SODIUM (PORCINE) 1000 UNIT/ML IJ SOLN
INTRAMUSCULAR | Status: DC | PRN
  Administered 2021-06-08: 3000 [IU] via INTRAVENOUS

## 2021-06-08 MED ORDER — CHLORHEXIDINE GLUCONATE 0.12 % MT SOLN: 15.0000 mL | Freq: Once | OROMUCOSAL | Status: AC

## 2021-06-08 MED ORDER — OXYCODONE HCL 5 MG/5ML PO SOLN: 5.0000 mg | Freq: Once | ORAL | Status: DC | PRN

## 2021-06-08 MED ORDER — BUPIVACAINE HCL (PF) 0.5 % IJ SOLN
INTRAMUSCULAR | Status: DC | PRN
  Administered 2021-06-08: 5 mL
  Administered 2021-06-08: 15 mL

## 2021-06-08 MED ORDER — HEPARIN 6000 UNIT IRRIGATION SOLUTION
Status: DC | PRN
  Administered 2021-06-08: 1

## 2021-06-08 MED ORDER — LIDOCAINE HCL (PF) 1 % IJ SOLN
INTRAMUSCULAR | Status: AC
  Filled 2021-06-08: qty 30

## 2021-06-08 MED ORDER — ACETAMINOPHEN 500 MG PO TABS
1000.0000 mg | ORAL_TABLET | Freq: Once | ORAL | Status: AC
  Administered 2021-06-08: 06:00:00 1000 mg via ORAL
  Filled 2021-06-08: qty 2

## 2021-06-08 MED ORDER — ONDANSETRON HCL 4 MG/2ML IJ SOLN
INTRAMUSCULAR | Status: DC | PRN
  Administered 2021-06-08: 4 mg via INTRAVENOUS

## 2021-06-08 MED ORDER — ORAL CARE MOUTH RINSE: 15.0000 mL | Freq: Once | OROMUCOSAL | Status: AC

## 2021-06-08 MED ORDER — HEPARIN 6000 UNIT IRRIGATION SOLUTION
Status: AC
  Filled 2021-06-08: qty 500

## 2021-06-08 MED ORDER — HEPARIN SODIUM (PORCINE) 1000 UNIT/ML IJ SOLN
INTRAMUSCULAR | Status: AC
  Filled 2021-06-08: qty 10

## 2021-06-08 SURGICAL SUPPLY — 44 items
ADH SKN CLS APL DERMABOND .7 (GAUZE/BANDAGES/DRESSINGS) ×1
ADH SKN CLS LQ APL DERMABOND (GAUZE/BANDAGES/DRESSINGS) ×1
AGENT HMST SPONGE THK3/8 (HEMOSTASIS)
ARMBAND PINK RESTRICT EXTREMIT (MISCELLANEOUS) ×4 IMPLANT
BAG COUNTER SPONGE SURGICOUNT (BAG) ×2 IMPLANT
BAG SPNG CNTER NS LX DISP (BAG) ×1
BLADE CLIPPER SURG (BLADE) ×2 IMPLANT
CANISTER SUCT 3000ML PPV (MISCELLANEOUS) ×2 IMPLANT
CLIP VESOCCLUDE MED 6/CT (CLIP) ×2 IMPLANT
CLIP VESOCCLUDE SM WIDE 6/CT (CLIP) ×2 IMPLANT
COVER PROBE W GEL 5X96 (DRAPES) ×2 IMPLANT
DECANTER SPIKE VIAL GLASS SM (MISCELLANEOUS) ×2 IMPLANT
DERMABOND ADHESIVE PROPEN (GAUZE/BANDAGES/DRESSINGS) ×1
DERMABOND ADVANCED (GAUZE/BANDAGES/DRESSINGS) ×1
DERMABOND ADVANCED .7 DNX12 (GAUZE/BANDAGES/DRESSINGS) ×1 IMPLANT
DERMABOND ADVANCED .7 DNX6 (GAUZE/BANDAGES/DRESSINGS) IMPLANT
DRAPE HALF SHEET 70X43 (DRAPES) ×1 IMPLANT
DRAPE ORTHO SPLIT 77X108 STRL (DRAPES) ×2
DRAPE SURG ORHT 6 SPLT 77X108 (DRAPES) IMPLANT
ELECT REM PT RETURN 9FT ADLT (ELECTROSURGICAL) ×2
ELECTRODE REM PT RTRN 9FT ADLT (ELECTROSURGICAL) ×1 IMPLANT
GAUZE 4X4 16PLY ~~LOC~~+RFID DBL (SPONGE) ×1 IMPLANT
GLOVE SRG 8 PF TXTR STRL LF DI (GLOVE) ×1 IMPLANT
GLOVE SURG ENC MOIS LTX SZ7.5 (GLOVE) ×2 IMPLANT
GLOVE SURG UNDER POLY LF SZ8 (GLOVE) ×2
GOWN STRL REUS W/ TWL LRG LVL3 (GOWN DISPOSABLE) ×2 IMPLANT
GOWN STRL REUS W/ TWL XL LVL3 (GOWN DISPOSABLE) ×2 IMPLANT
GOWN STRL REUS W/TWL LRG LVL3 (GOWN DISPOSABLE) ×4
GOWN STRL REUS W/TWL XL LVL3 (GOWN DISPOSABLE) ×4
HEMOSTAT SPONGE AVITENE ULTRA (HEMOSTASIS) IMPLANT
KIT BASIN OR (CUSTOM PROCEDURE TRAY) ×2 IMPLANT
KIT TURNOVER KIT B (KITS) ×2 IMPLANT
NS IRRIG 1000ML POUR BTL (IV SOLUTION) ×2 IMPLANT
PACK CV ACCESS (CUSTOM PROCEDURE TRAY) ×2 IMPLANT
PAD ARMBOARD 7.5X6 YLW CONV (MISCELLANEOUS) ×4 IMPLANT
SPONGE T-LAP 18X18 ~~LOC~~+RFID (SPONGE) ×1 IMPLANT
SUT MNCRL AB 4-0 PS2 18 (SUTURE) ×2 IMPLANT
SUT PROLENE 6 0 BV (SUTURE) ×2 IMPLANT
SUT PROLENE 7 0 BV 1 (SUTURE) IMPLANT
SUT VIC AB 3-0 SH 27 (SUTURE) ×2
SUT VIC AB 3-0 SH 27X BRD (SUTURE) ×1 IMPLANT
TOWEL GREEN STERILE (TOWEL DISPOSABLE) ×2 IMPLANT
UNDERPAD 30X36 HEAVY ABSORB (UNDERPADS AND DIAPERS) ×2 IMPLANT
WATER STERILE IRR 1000ML POUR (IV SOLUTION) ×2 IMPLANT

## 2021-06-08 NOTE — H&P (Signed)
History and Physical Interval Note:  06/08/2021 7:28 AM  Elizabeth Griffin Elizabeth Griffin  has presented today for surgery, with the diagnosis of CKD V.  The various methods of treatment have been discussed with the patient and family. After consideration of risks, benefits and other options for treatment, the patient has consented to  Procedure(s): LEFT ARM ARTERIOVENOUS (AV) FISTULA CREATION (Left) as a surgical intervention.  The patient's history has been reviewed, patient examined, no change in status, stable for surgery.  I have reviewed the patient's chart and labs.  Questions were answered to the patient's satisfaction.    Left arm AVF.  Marty Heck  Patient name: Elizabeth Griffin          MRN: [id number]        DOB: 14-Jan-1961          Sex: female   REASON FOR CONSULT: Evaluate for permanent hemodialysis access   HPI: Elizabeth Griffin is a 60 y.o. female, with history of hypertension and stage V CKD and rheumatoid arthritis that presents for evaluation of permanent hemodialysis access.  Patient is followed by Dr. Joelyn Oms with [facility].  His notes indicate she is stage V CKD.  She has not started dialysis at this time.  She is right-handed.  She has been referred to [facility] for evaluation of transplant.  No chest wall implants.  No previous access in the past.       Past Medical History:  Diagnosis Date   Allergic rhinitis      sees allergist   Anemia      sickle cell trait, saw hematology   Anxiety     BV (bacterial vaginosis)     CKD (chronic kidney disease)     Diverticulosis     Endometrial mass      benign   GERD (gastroesophageal reflux disease)     Hydradenitis      sees Dr. Ninfa Linden, s/p multiple surgeries, on disability   Hypertension     Obesity     Rheumatoid arthritis ([place])      sees Dr. Trudie Reed   Vitamin D deficiency             Past Surgical History:  Procedure Laterality Date   AXILLARY HIDRADENITIS EXCISION        right - 04/22/2006; left  - 01/02/2009   CHOLECYSTECTOMY       HYDRADENITIS EXCISION   07/31/2011    Procedure: EXCISION HYDRADENITIS GROIN;  Surgeon: Harl Bowie, MD;  Location: [place];  Service: General;  Laterality: Right;   HYSTEROSCOPY W/ ENDOMETRIAL ABLATION   11/23/2009    and D & C   HYSTEROSCOPY WITH D & C   07/15/2008   PERINEAL HIDRADENITIS EXCISION   01/23/2011    right buttock           Family History  Problem Relation Age of Onset   Hypertension Brother     Cancer Son          leukemia      SOCIAL HISTORY: Social History         Socioeconomic History   Marital status: Widowed      Spouse name: Not on file   Number of children: Not on file   Years of education: Not on file   Highest education level: Not on file  Occupational History   Not on file  Tobacco Use   Smoking status: Never   Smokeless tobacco: Never  Substance and Sexual Activity   Alcohol use: No   Drug use: No   Sexual activity: Not Currently      Birth control/protection: Abstinence  Other Topics Concern   Not on file  Social History Narrative    Work or School: on disability         Home Situation: takes two foster kids and her son          Spiritual Beliefs: Theme park manager, 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         Lifestyle: goes to the gym 3 days per week; diet is so so         Social Determinants of Adult nurse Strain: Not on file  Food Insecurity: Not on file  Transportation Needs: Not on file  Physical Activity: Not on file  Stress: Not on file  Social Connections: Not on file  Intimate Partner Violence: Not on file           Allergies  Allergen Reactions   Adhesive [Tape] Hives   Bactrim [Sulfamethoxazole-Trimethoprim] Nausea And Vomiting   Ibuprofen        Causes ulcer    Lisinopril        dehydration   Shellfish Allergy Swelling            Current Outpatient Medications  Medication Sig Dispense Refill   ALPRAZolam (XANAX PO) Xanax       cetirizine HCl  (ZYRTEC) 1 MG/ML solution Take by mouth daily.       Cholecalciferol (VITAMIN D3 PO) Take by mouth.       Estradiol 10 % CREA COMPOUNDED MEDICATION  Estradiol Vaginal Cream in Versa Base 0.02%/mL  apply 1 mL with finger, every other day for 10 days then twice weekly thereafter       ferrous sulfate 325 (65 FE) MG tablet Take 1 tablet (325 mg total) by mouth daily with breakfast. 90 tablet 0   Fluticasone Propionate, Inhal, 50 MCG/ACT AEPB See admin instructions.       golimumab (SIMPONI ARIA) 50 MG/4ML SOLN injection Inject into the vein.       pantoprazole (PROTONIX) 40 MG tablet Take 1 tablet (40 mg total) by mouth daily. TAKE 1 TABLET BY MOUTH EVERY DAY 90 tablet 3   thyroid (ARMOUR) 30 MG tablet Take 30 mg by mouth daily before breakfast.       amLODipine (NORVASC) 5 MG tablet Take 1 tablet (5 mg total) by mouth daily. TAKE 1 TABLET BY MOUTH EVERY DAY (Patient not taking: Reported on 04/24/2021) 90 tablet 3   clindamycin (CLEOCIN T) 1 % lotion Apply topically 2 (two) times daily. (Patient not taking: Reported on 04/24/2021)       diphenhydrAMINE (BENADRYL) 25 MG tablet Take 50 mg by mouth at bedtime. (Patient not taking: Reported on 65/99/3570)       FOLIC ACID PO Take by mouth. (Patient not taking: Reported on 04/24/2021)       methotrexate (RHEUMATREX) 2.5 MG tablet Take by mouth. (Patient not taking: Reported on 04/24/2021)        No current facility-administered medications for this visit.      REVIEW OF SYSTEMS:  [X]  denotes positive finding, [ ]  denotes negative finding Cardiac   Comments:  Chest pain or chest pressure:      Shortness of breath upon exertion:      Short of breath when lying flat:      Irregular heart rhythm:  Vascular      Pain in calf, thigh, or hip brought on by ambulation:      Pain in feet at night that wakes you up from your sleep:       Blood clot in your veins:      Leg swelling:              Pulmonary      Oxygen at home:       Productive cough:       Wheezing:              Neurologic      Sudden weakness in arms or legs:       Sudden numbness in arms or legs:       Sudden onset of difficulty speaking or slurred speech:      Temporary loss of vision in one eye:       Problems with dizziness:              Gastrointestinal      Blood in stool:       Vomited blood:              Genitourinary      Burning when urinating:       Blood in urine:             Psychiatric      Major depression:              Hematologic      Bleeding problems:      Problems with blood clotting too easily:             Skin      Rashes or ulcers:             Constitutional      Fever or chills:          PHYSICAL EXAM:    Vitals:    04/24/21 1057  BP: (!) 150/83  Pulse: 73  Resp: 20  Temp: 97.8 F (36.6 C)  TempSrc: Temporal  Weight: 242 lb (109.8 kg)  Height: 5\' 3"  (1.6 m)      GENERAL: The patient is a well-nourished female, in no acute distress. The vital signs are documented above. CARDIAC: There is a regular rate and rhythm.  VASCULAR:  Palpable radial and brachial pulses bilateral upper extremities No chest wall implants PULMONARY: No respiratory distress. ABDOMEN: Soft and non-tender. MUSCULOSKELETAL: There are no major deformities or cyanosis. NEUROLOGIC: No focal weakness or paresthesias are detected. SKIN: There are no ulcers or rashes noted. PSYCHIATRIC: The patient has a normal affect.   DATA:    Upper extremity arterial duplex showed triphasic waveform in both upper extremity   Upper extremity vein mapping shows an excellent cephalic and basilic vein in the left arm which is the nondominant arm   Assessment/Plan:   60 year old female with stage V CKD that presents for evaluation of permanent hemodialysis access.  Discussed plan for placement in the left arm given she is right-hand dominant.  In review of the vein mapping, she has a nice cephalic as well as basilic vein in the left arm with  easily palpable pules.  Discussed would likely be brachiocephalic at the elbow.  Discussed this will take up to 3 months to mature after access is placed prior to being usable.  Risk benefits discussed including risk of bleeding, infection, failure to mature, steal.  She would like to wait until after December 17 given  she is a Oceanographer at [employer].  We will hopefully get her scheduled for December.     Marty Heck, MD [facility] [place] Office: [phone]

## 2021-06-08 NOTE — Anesthesia Procedure Notes (Signed)
Anesthesia Regional Block: Supraclavicular block   Pre-Anesthetic Checklist: , timeout performed,  Correct Patient, Correct Site, Correct Laterality,  Correct Procedure, Correct Position, site marked,  Risks and benefits discussed,  Surgical consent,  Pre-op evaluation,  At surgeon's request and post-op pain management  Laterality: Left  Prep: chloraprep       Needles:  Injection technique: Single-shot  Needle Type: Echogenic Stimulator Needle     Needle Length: 10cm  Needle Gauge: 20     Additional Needles:   Procedures:,,,, ultrasound used (permanent image in chart),,    Narrative:  Start time: 06/08/2021 7:11 AM End time: 06/08/2021 7:16 AM Injection made incrementally with aspirations every 5 mL.  Performed by: Personally  Anesthesiologist: Lidia Collum, MD  Additional Notes: Standard monitors applied. Skin prepped. Good needle visualization with ultrasound. Injection made in 5cc increments with no resistance to injection. Patient tolerated the procedure well. Additional 10 mL infiltrated subcutaneously in the axilla for intercostobrachial coverage

## 2021-06-08 NOTE — Op Note (Signed)
OPERATIVE NOTE   PROCEDURE: left radiocephalic arteriovenous fistula placement (at antecubital fossa due to high brachial artery bifurcation)  PRE-OPERATIVE DIAGNOSIS: chronic kidney disease stage 5  POST-OPERATIVE DIAGNOSIS: same as above   SURGEON: Marty Heck, MD  ASSISTANT(S): Leontine Locket, PA  ANESTHESIA: regional  ESTIMATED BLOOD LOSS: Minimal  FINDING(S): 1.  Cephalic vein: 3-4 mm, acceptable 2.  Radial artery: 3 mm, disease free 3.  Venous outflow: palpable thrill  4.  Radial flow: dopplerable radial signal  SPECIMEN(S):  none  INDICATIONS:   Elizabeth Griffin is a 60 y.o. female who presents with chronic kidney disease and the need for permanent dialysis access.  The patient is scheduled for left arm arteriovenous fistula placement.  The patient is aware the risks include but are not limited to: bleeding, infection, steal syndrome, nerve damage, ischemic monomelic neuropathy, failure to mature, and need for additional procedures.  The patient is aware of the risks of the procedure and elects to proceed forward.  An assistant was needed for exposure and to expedite the case.   DESCRIPTION: After full informed written consent was obtained from the patient, the patient was brought back to the operating room and placed supine upon the operating table.  Prior to induction, the patient received IV antibiotics.   After obtaining adequate anesthesia, the patient was then prepped and draped in the standard fashion for a left arm access procedure.  I turned my attention first to identifying the patient's cephalic vein and this was of good caliber just below the antecubital fossa.  It appeared that the brachial artery had a high bifurcation in the upper arm and the radial and ulnar artery appeared to be of equal size near the antecubital crease.  I subsequently elected to use the radial artery for inflow. Using SonoSite guidance, the location of these vessels were marked  out on the skin.     I made a transverse incision at the level of the antecubitum and dissected through the subcutaneous tissue and fascia to gain exposure of the radial artery.  This was noted to be 3 mm in diameter externally.  This was dissected out proximally and distally and controlled with vessel loops .  I then dissected out the cephalic vein.  This was noted to be 3-4 mm in diameter externally.  The distal segment of the vein was ligated with a  2-0 silk, and the vein was transected.  The proximal segment was interrogated with serial dilators.  The vein accepted up to a 5 mm dilator without any difficulty.  I then instilled the heparinized saline into the vein and clamped it.  At this point, I reset my exposure of the radial artery. The patient was given 3,000 units IV heparin.  I then placed the artery under tension proximally and distally.  I made an arteriotomy with a #11 blade, and then I extended the arteriotomy with a Potts scissor.  I injected heparinized saline proximal and distal to this arteriotomy.  The vein was then sewn to the artery in an end-to-side configuration with a running stitch of 6-0 Prolene.  Prior to completing this anastomosis, I allowed the vein and artery to backbleed.  There was no evidence of clot from any vessels.  I completed the anastomosis in the usual fashion and then released all vessel loops and clamps.    There was a palpable thrill in the venous outflow, and there was a dopplerable radial signal.  At this point, I irrigated  out the surgical wound.  There was no further active bleeding.  The subcutaneous tissue was reapproximated with a running stitch of 3-0 Vicryl.  The skin was then reapproximated with a running subcuticular stitch of 4-0 Monocryl.  The skin was then cleaned, dried, and reinforced with Dermabond.  The patient tolerated this procedure well.   COMPLICATIONS: None  CONDITION: Stable  Marty Heck, MD [facility] Office: [place]   06/08/2021, 8:50 AM

## 2021-06-08 NOTE — Anesthesia Postprocedure Evaluation (Signed)
Anesthesia Post Note  Patient: Elizabeth Griffin  Procedure(s) Performed: LEFT ARM ARTERIOVENOUS (AV) FISTULA CREATION (Left: Arm Lower)     Patient location during evaluation: PACU Anesthesia Type: Regional Level of consciousness: awake and alert Pain management: pain level controlled Vital Signs Assessment: post-procedure vital signs reviewed and stable Respiratory status: spontaneous breathing, nonlabored ventilation and respiratory function stable Cardiovascular status: blood pressure returned to baseline and stable Postop Assessment: no apparent nausea or vomiting Anesthetic complications: no   No notable events documented.  Last Vitals:  Vitals:   06/08/21 0905 06/08/21 0918  BP:  (!) 166/89  Pulse:    Resp:  15  Temp: 36.4 C 36.7 C  SpO2: 100%     Last Pain:  Vitals:   06/08/21 0918  TempSrc:   PainSc: 0-No pain                 Lidia Collum

## 2021-06-08 NOTE — Discharge Instructions (Signed)
° °  [facility]  Discharge Instructions  AV Fistula or Graft Surgery for Dialysis Access  Please refer to the following instructions for your post-procedure care. Your surgeon or physician assistant will discuss any changes with you.  Activity  You may drive the day following your surgery, if you are comfortable and no longer taking prescription pain medication. Resume full activity as the soreness in your incision resolves.  Bathing/Showering  You may shower after you go home. Keep your incision dry for 48 hours. Do not soak in a bathtub, hot tub, or swim until the incision heals completely. You may not shower if you have a hemodialysis catheter.  Incision Care  Clean your incision with mild soap and water after 48 hours. Pat the area dry with a clean towel. You do not need a bandage unless otherwise instructed. Do not apply any ointments or creams to your incision. You may have skin glue on your incision. Do not peel it off. It will come off on its own in about one week. Your arm may swell a bit after surgery. To reduce swelling use pillows to elevate your arm so it is above your heart. Your doctor will tell you if you need to lightly wrap your arm with an ACE bandage.  Diet  Resume your normal diet. There are not special food restrictions following this procedure. In order to heal from your surgery, it is CRITICAL to get adequate nutrition. Your body requires vitamins, minerals, and protein. Vegetables are the best source of vitamins and minerals. Vegetables also provide the perfect balance of protein. Processed food has little nutritional value, so try to avoid this.  Medications  Resume taking all of your medications. If your incision is causing pain, you may take over-the counter pain relievers such as acetaminophen (Tylenol). If you were prescribed a stronger pain medication, please be aware these medications can cause nausea and constipation. Prevent  nausea by taking the medication with a snack or meal. Avoid constipation by drinking plenty of fluids and eating foods with high amount of fiber, such as fruits, vegetables, and grains.  Do not take Tylenol if you are taking prescription pain medications.  Follow up Your surgeon may want to see you in the office following your access surgery. If so, this will be arranged at the time of your surgery.  Please call us immediately for any of the following conditions:  Increased pain, redness, drainage (pus) from your incision site Fever of 101 degrees or higher Severe or worsening pain at your incision site Hand pain or numbness.  Reduce your risk of vascular disease:  Stop smoking. If you would like help, call [facility] at 1-800-QUIT-NOW 737-261-5078) or [place] at [place] your cholesterol Maintain a desired weight Control your diabetes Keep your blood pressure down  Dialysis  It will take several weeks to several months for your new dialysis access to be ready for use. Your surgeon will determine when it is okay to use it. Your nephrologist will continue to direct your dialysis. You can continue to use your Permcath until your new access is ready for use.   06/08/2021 Elizabeth Griffin [id number] 1960/07/16  Surgeon(s): Marty Heck, MD  Procedure(s): Creation left brachiocephalic AV fistula  x Do not stick fistula for 12 weeks    If you have any questions, please call the office at [phone].

## 2021-06-08 NOTE — Transfer of Care (Signed)
Immediate Anesthesia Transfer of Care Note  Patient: Elizabeth Griffin  Procedure(s) Performed: LEFT ARM ARTERIOVENOUS (AV) FISTULA CREATION (Left: Arm Lower)  Patient Location: PACU  Anesthesia Type:MAC and Regional  Level of Consciousness: awake, drowsy, patient cooperative and responds to stimulation  Airway & Oxygen Therapy: Patient Spontanous Breathing and Patient connected to nasal cannula oxygen  Post-op Assessment: Report given to RN and Post -op Vital signs reviewed and stable  Post vital signs: Reviewed and stable  Last Vitals:  Vitals Value Taken Time  BP    Temp    Pulse    Resp 20 06/08/21 0902  SpO2    Vitals shown include unvalidated device data.  Last Pain:  Vitals:   06/08/21 0603  TempSrc:   PainSc: 0-No pain      Patients Stated Pain Goal: 2 (11/00/34 9611)  Complications: No notable events documented.

## 2021-06-09 ENCOUNTER — Encounter (HOSPITAL_COMMUNITY): Payer: Self-pay | Admitting: Vascular Surgery

## 2021-06-20 DIAGNOSIS — L732 Hidradenitis suppurativa: Secondary | ICD-10-CM | POA: Diagnosis not present

## 2021-06-20 DIAGNOSIS — M0609 Rheumatoid arthritis without rheumatoid factor, multiple sites: Secondary | ICD-10-CM | POA: Diagnosis not present

## 2021-06-20 DIAGNOSIS — Z79899 Other long term (current) drug therapy: Secondary | ICD-10-CM | POA: Diagnosis not present

## 2021-06-20 DIAGNOSIS — R5383 Other fatigue: Secondary | ICD-10-CM | POA: Diagnosis not present

## 2021-06-20 DIAGNOSIS — M2550 Pain in unspecified joint: Secondary | ICD-10-CM | POA: Diagnosis not present

## 2021-06-21 DIAGNOSIS — L732 Hidradenitis suppurativa: Secondary | ICD-10-CM | POA: Diagnosis not present

## 2021-06-21 DIAGNOSIS — Z23 Encounter for immunization: Secondary | ICD-10-CM | POA: Diagnosis not present

## 2021-06-28 ENCOUNTER — Other Ambulatory Visit (HOSPITAL_COMMUNITY): Payer: Self-pay

## 2021-06-28 DIAGNOSIS — K5730 Diverticulosis of large intestine without perforation or abscess without bleeding: Secondary | ICD-10-CM | POA: Diagnosis not present

## 2021-06-28 DIAGNOSIS — D122 Benign neoplasm of ascending colon: Secondary | ICD-10-CM | POA: Diagnosis not present

## 2021-06-28 DIAGNOSIS — K649 Unspecified hemorrhoids: Secondary | ICD-10-CM | POA: Diagnosis not present

## 2021-06-28 DIAGNOSIS — D125 Benign neoplasm of sigmoid colon: Secondary | ICD-10-CM | POA: Diagnosis not present

## 2021-06-28 DIAGNOSIS — Z1211 Encounter for screening for malignant neoplasm of colon: Secondary | ICD-10-CM | POA: Diagnosis not present

## 2021-06-29 ENCOUNTER — Other Ambulatory Visit: Payer: Self-pay

## 2021-06-29 ENCOUNTER — Encounter (HOSPITAL_COMMUNITY)
Admission: RE | Admit: 2021-06-29 | Discharge: 2021-06-29 | Disposition: A | Discharge status: Home / Self Care | Payer: Medicare Other | Source: Ambulatory Visit | Attending: Nephrology | Admitting: Nephrology

## 2021-06-29 VITALS — BP 138/79 | HR 76 | Temp 97.2°F | Resp 18

## 2021-06-29 DIAGNOSIS — D631 Anemia in chronic kidney disease: Secondary | ICD-10-CM | POA: Diagnosis not present

## 2021-06-29 DIAGNOSIS — D649 Anemia, unspecified: Secondary | ICD-10-CM | POA: Insufficient documentation

## 2021-06-29 DIAGNOSIS — N185 Chronic kidney disease, stage 5: Secondary | ICD-10-CM | POA: Insufficient documentation

## 2021-06-29 LAB — RENAL FUNCTION PANEL
Albumin: 3.2 g/dL — ABNORMAL LOW (ref 3.5–5.0)
Anion gap: 6 (ref 5–15)
BUN: 40 mg/dL — ABNORMAL HIGH (ref 6–20)
CO2: 19 mmol/L — ABNORMAL LOW (ref 22–32)
Calcium: 8.6 mg/dL — ABNORMAL LOW (ref 8.9–10.3)
Chloride: 113 mmol/L — ABNORMAL HIGH (ref 98–111)
Creatinine, Ser: 3.96 mg/dL — ABNORMAL HIGH (ref 0.44–1.00)
GFR, Estimated: 12 mL/min — ABNORMAL LOW (ref >60)
Glucose, Bld: 96 mg/dL (ref 70–99)
Phosphorus: 4.7 mg/dL — ABNORMAL HIGH (ref 2.5–4.6)
Potassium: 4.3 mmol/L (ref 3.5–5.1)
Sodium: 138 mmol/L (ref 135–145)

## 2021-06-29 LAB — IRON AND TIBC
Iron: 42 ug/dL (ref 28–170)
Saturation Ratios: 19 % (ref 10.4–31.8)
TIBC: 227 ug/dL — ABNORMAL LOW (ref 250–450)
UIBC: 185 ug/dL

## 2021-06-29 LAB — POCT HEMOGLOBIN-HEMACUE: Hemoglobin: 9.2 g/dL — ABNORMAL LOW (ref 12.0–15.0)

## 2021-06-29 LAB — FERRITIN: Ferritin: 235 ng/mL (ref 11–307)

## 2021-06-29 MED ORDER — EPOETIN ALFA-EPBX 10000 UNIT/ML IJ SOLN
20000.0000 [IU] | INTRAMUSCULAR | Status: DC
  Administered 2021-06-29: 20000 [IU] via SUBCUTANEOUS

## 2021-06-29 MED ORDER — EPOETIN ALFA-EPBX 10000 UNIT/ML IJ SOLN
INTRAMUSCULAR | Status: AC
  Filled 2021-06-29: qty 2

## 2021-06-30 LAB — PTH, INTACT AND CALCIUM
Calcium, Total (PTH): 8.8 mg/dL (ref 8.7–10.2)
PTH: 194 pg/mL — ABNORMAL HIGH (ref 15–65)

## 2021-07-03 DIAGNOSIS — D122 Benign neoplasm of ascending colon: Secondary | ICD-10-CM | POA: Diagnosis not present

## 2021-07-03 DIAGNOSIS — D125 Benign neoplasm of sigmoid colon: Secondary | ICD-10-CM | POA: Diagnosis not present

## 2021-07-11 DIAGNOSIS — Z9884 Bariatric surgery status: Secondary | ICD-10-CM | POA: Diagnosis not present

## 2021-07-13 ENCOUNTER — Encounter (HOSPITAL_COMMUNITY)
Admission: RE | Admit: 2021-07-13 | Discharge: 2021-07-13 | Disposition: A | Discharge status: Home / Self Care | Payer: Medicare Other | Source: Ambulatory Visit | Attending: Nephrology | Admitting: Nephrology

## 2021-07-13 ENCOUNTER — Other Ambulatory Visit: Payer: Self-pay

## 2021-07-13 DIAGNOSIS — N185 Chronic kidney disease, stage 5: Secondary | ICD-10-CM | POA: Diagnosis not present

## 2021-07-13 DIAGNOSIS — D649 Anemia, unspecified: Secondary | ICD-10-CM | POA: Insufficient documentation

## 2021-07-13 DIAGNOSIS — D631 Anemia in chronic kidney disease: Secondary | ICD-10-CM | POA: Diagnosis not present

## 2021-07-13 MED ORDER — SODIUM CHLORIDE 0.9 % IV SOLN
510.0000 mg | INTRAVENOUS | Status: DC
  Administered 2021-07-13: 510 mg via INTRAVENOUS
  Filled 2021-07-13: qty 510

## 2021-07-16 DIAGNOSIS — L732 Hidradenitis suppurativa: Secondary | ICD-10-CM | POA: Diagnosis not present

## 2021-07-18 ENCOUNTER — Other Ambulatory Visit: Payer: Self-pay

## 2021-07-18 DIAGNOSIS — N185 Chronic kidney disease, stage 5: Secondary | ICD-10-CM

## 2021-07-19 ENCOUNTER — Encounter (HOSPITAL_COMMUNITY): Payer: Medicare Other

## 2021-07-24 ENCOUNTER — Ambulatory Visit (HOSPITAL_COMMUNITY)
Admission: RE | Admit: 2021-07-24 | Discharge: 2021-07-24 | Disposition: A | Discharge status: Home / Self Care | Payer: Medicare Other | Source: Ambulatory Visit | Attending: Vascular Surgery | Admitting: Vascular Surgery

## 2021-07-24 ENCOUNTER — Ambulatory Visit (INDEPENDENT_AMBULATORY_CARE_PROVIDER_SITE_OTHER): Payer: Medicare Other | Admitting: Physician Assistant

## 2021-07-24 ENCOUNTER — Other Ambulatory Visit: Payer: Self-pay

## 2021-07-24 VITALS — BP 140/78 | HR 81 | Temp 97.7°F | Resp 18 | Ht 62.0 in | Wt 231.0 lb

## 2021-07-24 DIAGNOSIS — N185 Chronic kidney disease, stage 5: Secondary | ICD-10-CM

## 2021-07-24 NOTE — Progress Notes (Signed)
°  POST OPERATIVE OFFICE NOTE    CC:  F/u for surgery  HPI:  This is a 61 y.o. female who is CKD stage V not requiring HD at this time.  She is s/p left BC av fistula creation by Dr. Carlis Abbott on 06/08/21.  She denies steal symptoms.     Pt returns today for follow up.  She is followed by Dr. Joelyn Oms.    Allergies  Allergen Reactions   Adhesive [Tape] Hives   Bactrim [Sulfamethoxazole-Trimethoprim] Nausea And Vomiting   Ibuprofen     Causes ulcer    Lisinopril     dehydration   Shellfish Allergy Swelling    Current Outpatient Medications  Medication Sig Dispense Refill   acetaminophen (TYLENOL) 650 MG CR tablet Take 650 mg by mouth every 8 (eight) hours as needed for pain.     cetirizine (ZYRTEC) 10 MG tablet Take 10 mg by mouth daily.     doxycycline (VIBRA-TABS) 100 MG tablet Take 100 mg by mouth 2 (two) times daily.     fluconazole (DIFLUCAN) 150 MG tablet Take 150 mg by mouth once.     golimumab (SIMPONI ARIA) 50 MG/4ML SOLN injection Inject 50 mg into the vein every 30 (thirty) days.     HYDROcodone-acetaminophen (NORCO/VICODIN) 5-325 MG tablet Take 1 tablet by mouth every 6 (six) hours as needed for moderate pain. 8 tablet 0   MAGNESIUM PO Take 1 tablet by mouth daily.     pantoprazole (PROTONIX) 40 MG tablet Take 1 tablet (40 mg total) by mouth daily. TAKE 1 TABLET BY MOUTH EVERY DAY 90 tablet 3   thyroid (ARMOUR) 30 MG tablet Take 30 mg by mouth daily before breakfast.     No current facility-administered medications for this visit.     ROS:  See HPI  Physical Exam:    Incision:  well healed Extremities:  left UE with palpable thrill traceable up the arm. Neuro: sensation and motor intact Lungs:  non labored breathing   Assessment/Plan:  This is a 61 y.o. female who is s/p: left BC AV fistula creation with diagnosis CKD stage V.  The duplex demonstrates a large diameter > 0.68 throughout with distally < 0.6 cm depth from skin surface.  The upper half is deep.  The  fistula may be usable in march 30th 2023.  If she needs HD they may try to access it after this date.  If they have problems with cannulation we will have to superficialize the proximal portion and branch ligation.     -She is followed by Dr. Joelyn Oms @ [facility].  F/U as needed.   Of note she is on a kidney transplant list.   Roxy Horseman PA-C [facility] [phone]   Clinic MD:  Carlis Abbott

## 2021-07-27 ENCOUNTER — Encounter (HOSPITAL_COMMUNITY)
Admission: RE | Admit: 2021-07-27 | Discharge: 2021-07-27 | Disposition: A | Discharge status: Home / Self Care | Payer: Medicare Other | Source: Ambulatory Visit | Attending: Nephrology | Admitting: Nephrology

## 2021-07-27 VITALS — BP 118/74 | HR 67 | Temp 97.2°F | Resp 18 | Wt 231.0 lb

## 2021-07-27 DIAGNOSIS — N185 Chronic kidney disease, stage 5: Secondary | ICD-10-CM | POA: Diagnosis not present

## 2021-07-27 DIAGNOSIS — D649 Anemia, unspecified: Secondary | ICD-10-CM

## 2021-07-27 DIAGNOSIS — D631 Anemia in chronic kidney disease: Secondary | ICD-10-CM | POA: Diagnosis not present

## 2021-07-27 LAB — RENAL FUNCTION PANEL
Albumin: 3.4 g/dL — ABNORMAL LOW (ref 3.5–5.0)
Anion gap: 9 (ref 5–15)
BUN: 48 mg/dL — ABNORMAL HIGH (ref 6–20)
CO2: 21 mmol/L — ABNORMAL LOW (ref 22–32)
Calcium: 9.1 mg/dL (ref 8.9–10.3)
Chloride: 109 mmol/L (ref 98–111)
Creatinine, Ser: 4.33 mg/dL — ABNORMAL HIGH (ref 0.44–1.00)
GFR, Estimated: 11 mL/min — ABNORMAL LOW (ref >60)
Glucose, Bld: 99 mg/dL (ref 70–99)
Phosphorus: 4.6 mg/dL (ref 2.5–4.6)
Potassium: 4.6 mmol/L (ref 3.5–5.1)
Sodium: 139 mmol/L (ref 135–145)

## 2021-07-27 LAB — POCT HEMOGLOBIN-HEMACUE: Hemoglobin: 9.9 g/dL — ABNORMAL LOW (ref 12.0–15.0)

## 2021-07-27 MED ORDER — EPOETIN ALFA-EPBX 10000 UNIT/ML IJ SOLN
INTRAMUSCULAR | Status: AC
  Administered 2021-07-27: 20000 [IU] via SUBCUTANEOUS
  Filled 2021-07-27: qty 2

## 2021-07-27 MED ORDER — EPOETIN ALFA-EPBX 10000 UNIT/ML IJ SOLN: 20000.0000 [IU] | INTRAMUSCULAR | Status: DC

## 2021-07-27 MED ORDER — SODIUM CHLORIDE 0.9 % IV SOLN
510.0000 mg | INTRAVENOUS | Status: DC
  Administered 2021-07-27: 510 mg via INTRAVENOUS
  Filled 2021-07-27: qty 510

## 2021-08-14 DIAGNOSIS — L732 Hidradenitis suppurativa: Secondary | ICD-10-CM | POA: Diagnosis not present

## 2021-08-20 DIAGNOSIS — Z940 Kidney transplant status: Secondary | ICD-10-CM | POA: Diagnosis not present

## 2021-08-20 DIAGNOSIS — N186 End stage renal disease: Secondary | ICD-10-CM | POA: Diagnosis not present

## 2021-08-20 DIAGNOSIS — Z01818 Encounter for other preprocedural examination: Secondary | ICD-10-CM | POA: Diagnosis not present

## 2021-08-20 DIAGNOSIS — N281 Cyst of kidney, acquired: Secondary | ICD-10-CM | POA: Diagnosis not present

## 2021-08-24 ENCOUNTER — Other Ambulatory Visit: Payer: Self-pay

## 2021-08-24 ENCOUNTER — Encounter (HOSPITAL_COMMUNITY)
Admission: RE | Admit: 2021-08-24 | Discharge: 2021-08-24 | Disposition: A | Discharge status: Home / Self Care | Payer: Medicare Other | Source: Ambulatory Visit | Attending: Nephrology | Admitting: Nephrology

## 2021-08-24 VITALS — BP 136/80 | HR 94 | Temp 97.4°F | Resp 18

## 2021-08-24 DIAGNOSIS — D631 Anemia in chronic kidney disease: Secondary | ICD-10-CM | POA: Insufficient documentation

## 2021-08-24 DIAGNOSIS — D649 Anemia, unspecified: Secondary | ICD-10-CM | POA: Insufficient documentation

## 2021-08-24 DIAGNOSIS — N185 Chronic kidney disease, stage 5: Secondary | ICD-10-CM | POA: Diagnosis not present

## 2021-08-24 LAB — RENAL FUNCTION PANEL
Albumin: 3.3 g/dL — ABNORMAL LOW (ref 3.5–5.0)
Anion gap: 8 (ref 5–15)
BUN: 46 mg/dL — ABNORMAL HIGH (ref 6–20)
CO2: 19 mmol/L — ABNORMAL LOW (ref 22–32)
Calcium: 8.5 mg/dL — ABNORMAL LOW (ref 8.9–10.3)
Chloride: 110 mmol/L (ref 98–111)
Creatinine, Ser: 4.18 mg/dL — ABNORMAL HIGH (ref 0.44–1.00)
GFR, Estimated: 12 mL/min — ABNORMAL LOW (ref >60)
Glucose, Bld: 94 mg/dL (ref 70–99)
Phosphorus: 3.9 mg/dL (ref 2.5–4.6)
Potassium: 4.2 mmol/L (ref 3.5–5.1)
Sodium: 137 mmol/L (ref 135–145)

## 2021-08-24 LAB — IRON AND TIBC
Iron: 66 ug/dL (ref 28–170)
Saturation Ratios: 28 % (ref 10.4–31.8)
TIBC: 232 ug/dL — ABNORMAL LOW (ref 250–450)
UIBC: 166 ug/dL

## 2021-08-24 LAB — POCT HEMOGLOBIN-HEMACUE: Hemoglobin: 10.3 g/dL — ABNORMAL LOW (ref 12.0–15.0)

## 2021-08-24 MED ORDER — EPOETIN ALFA-EPBX 10000 UNIT/ML IJ SOLN
20000.0000 [IU] | INTRAMUSCULAR | Status: DC
  Administered 2021-08-24: 20000 [IU] via SUBCUTANEOUS

## 2021-08-24 MED ORDER — EPOETIN ALFA-EPBX 10000 UNIT/ML IJ SOLN
INTRAMUSCULAR | Status: AC
Start: 2021-08-24 — End: 2021-08-24
  Filled 2021-08-24: qty 2

## 2021-08-28 DIAGNOSIS — N185 Chronic kidney disease, stage 5: Secondary | ICD-10-CM | POA: Diagnosis not present

## 2021-08-28 DIAGNOSIS — L732 Hidradenitis suppurativa: Secondary | ICD-10-CM | POA: Diagnosis not present

## 2021-08-28 DIAGNOSIS — M069 Rheumatoid arthritis, unspecified: Secondary | ICD-10-CM | POA: Diagnosis not present

## 2021-08-28 DIAGNOSIS — E039 Hypothyroidism, unspecified: Secondary | ICD-10-CM | POA: Diagnosis not present

## 2021-08-28 DIAGNOSIS — Z9884 Bariatric surgery status: Secondary | ICD-10-CM | POA: Diagnosis not present

## 2021-09-21 ENCOUNTER — Encounter (HOSPITAL_COMMUNITY)
Admission: RE | Admit: 2021-09-21 | Discharge: 2021-09-21 | Disposition: A | Discharge status: Home / Self Care | Payer: Medicare Other | Source: Ambulatory Visit | Attending: Nephrology | Admitting: Nephrology

## 2021-09-21 VITALS — BP 133/90 | HR 95 | Temp 97.4°F | Resp 18

## 2021-09-21 DIAGNOSIS — D631 Anemia in chronic kidney disease: Secondary | ICD-10-CM | POA: Diagnosis not present

## 2021-09-21 DIAGNOSIS — N185 Chronic kidney disease, stage 5: Secondary | ICD-10-CM | POA: Insufficient documentation

## 2021-09-21 DIAGNOSIS — D649 Anemia, unspecified: Secondary | ICD-10-CM | POA: Insufficient documentation

## 2021-09-21 LAB — IRON AND TIBC
Iron: 87 ug/dL (ref 28–170)
Saturation Ratios: 38 % — ABNORMAL HIGH (ref 10.4–31.8)
TIBC: 227 ug/dL — ABNORMAL LOW (ref 250–450)
UIBC: 140 ug/dL

## 2021-09-21 LAB — RENAL FUNCTION PANEL
Albumin: 3.1 g/dL — ABNORMAL LOW (ref 3.5–5.0)
Anion gap: 4 — ABNORMAL LOW (ref 5–15)
BUN: 54 mg/dL — ABNORMAL HIGH (ref 6–20)
CO2: 21 mmol/L — ABNORMAL LOW (ref 22–32)
Calcium: 9 mg/dL (ref 8.9–10.3)
Chloride: 114 mmol/L — ABNORMAL HIGH (ref 98–111)
Creatinine, Ser: 4.76 mg/dL — ABNORMAL HIGH (ref 0.44–1.00)
GFR, Estimated: 10 mL/min — ABNORMAL LOW (ref >60)
Glucose, Bld: 88 mg/dL (ref 70–99)
Phosphorus: 3.4 mg/dL (ref 2.5–4.6)
Potassium: 4.6 mmol/L (ref 3.5–5.1)
Sodium: 139 mmol/L (ref 135–145)

## 2021-09-21 LAB — POCT HEMOGLOBIN-HEMACUE: Hemoglobin: 10.4 g/dL — ABNORMAL LOW (ref 12.0–15.0)

## 2021-09-21 LAB — FERRITIN: Ferritin: 475 ng/mL — ABNORMAL HIGH (ref 11–307)

## 2021-09-21 MED ORDER — EPOETIN ALFA-EPBX 10000 UNIT/ML IJ SOLN
INTRAMUSCULAR | Status: AC
  Filled 2021-09-21: qty 2

## 2021-09-21 MED ORDER — EPOETIN ALFA-EPBX 10000 UNIT/ML IJ SOLN
20000.0000 [IU] | INTRAMUSCULAR | Status: DC
  Administered 2021-09-21: 20000 [IU] via SUBCUTANEOUS

## 2021-09-22 LAB — PTH, INTACT AND CALCIUM
Calcium, Total (PTH): 9 mg/dL (ref 8.7–10.2)
PTH: 171 pg/mL — ABNORMAL HIGH (ref 15–65)

## 2021-09-28 DIAGNOSIS — N2581 Secondary hyperparathyroidism of renal origin: Secondary | ICD-10-CM | POA: Diagnosis not present

## 2021-09-28 DIAGNOSIS — I120 Hypertensive chronic kidney disease with stage 5 chronic kidney disease or end stage renal disease: Secondary | ICD-10-CM | POA: Diagnosis not present

## 2021-09-28 DIAGNOSIS — R058 Other specified cough: Secondary | ICD-10-CM | POA: Diagnosis not present

## 2021-09-28 DIAGNOSIS — D631 Anemia in chronic kidney disease: Secondary | ICD-10-CM | POA: Diagnosis not present

## 2021-09-28 DIAGNOSIS — T464X5A Adverse effect of angiotensin-converting-enzyme inhibitors, initial encounter: Secondary | ICD-10-CM | POA: Diagnosis not present

## 2021-09-28 DIAGNOSIS — M069 Rheumatoid arthritis, unspecified: Secondary | ICD-10-CM | POA: Diagnosis not present

## 2021-09-28 DIAGNOSIS — Z903 Acquired absence of stomach [part of]: Secondary | ICD-10-CM | POA: Diagnosis not present

## 2021-09-28 DIAGNOSIS — N185 Chronic kidney disease, stage 5: Secondary | ICD-10-CM | POA: Diagnosis not present

## 2021-09-28 DIAGNOSIS — R809 Proteinuria, unspecified: Secondary | ICD-10-CM | POA: Diagnosis not present

## 2021-10-19 ENCOUNTER — Encounter (HOSPITAL_COMMUNITY)
Admission: RE | Admit: 2021-10-19 | Discharge: 2021-10-19 | Disposition: A | Discharge status: Home / Self Care | Payer: Medicare Other | Source: Ambulatory Visit | Attending: Nephrology | Admitting: Nephrology

## 2021-10-19 VITALS — BP 131/86 | HR 76 | Temp 97.3°F | Resp 18

## 2021-10-19 DIAGNOSIS — D649 Anemia, unspecified: Secondary | ICD-10-CM | POA: Insufficient documentation

## 2021-10-19 DIAGNOSIS — D631 Anemia in chronic kidney disease: Secondary | ICD-10-CM | POA: Diagnosis not present

## 2021-10-19 DIAGNOSIS — N189 Chronic kidney disease, unspecified: Secondary | ICD-10-CM | POA: Diagnosis not present

## 2021-10-19 LAB — RENAL FUNCTION PANEL
Albumin: 3.2 g/dL — ABNORMAL LOW (ref 3.5–5.0)
Anion gap: 7 (ref 5–15)
BUN: 48 mg/dL — ABNORMAL HIGH (ref 6–20)
CO2: 21 mmol/L — ABNORMAL LOW (ref 22–32)
Calcium: 9 mg/dL (ref 8.9–10.3)
Chloride: 110 mmol/L (ref 98–111)
Creatinine, Ser: 4.77 mg/dL — ABNORMAL HIGH (ref 0.44–1.00)
GFR, Estimated: 10 mL/min — ABNORMAL LOW (ref >60)
Glucose, Bld: 98 mg/dL (ref 70–99)
Phosphorus: 4.6 mg/dL (ref 2.5–4.6)
Potassium: 5.1 mmol/L (ref 3.5–5.1)
Sodium: 138 mmol/L (ref 135–145)

## 2021-10-19 LAB — IRON AND TIBC
Iron: 65 ug/dL (ref 28–170)
Saturation Ratios: 28 % (ref 10.4–31.8)
TIBC: 230 ug/dL — ABNORMAL LOW (ref 250–450)
UIBC: 165 ug/dL

## 2021-10-19 LAB — POCT HEMOGLOBIN-HEMACUE: Hemoglobin: 10.3 g/dL — ABNORMAL LOW (ref 12.0–15.0)

## 2021-10-19 MED ORDER — EPOETIN ALFA-EPBX 10000 UNIT/ML IJ SOLN
20000.0000 [IU] | INTRAMUSCULAR | Status: DC
  Administered 2021-10-19: 20000 [IU] via SUBCUTANEOUS

## 2021-10-23 ENCOUNTER — Other Ambulatory Visit: Payer: Self-pay | Admitting: Surgery

## 2021-10-23 DIAGNOSIS — L732 Hidradenitis suppurativa: Secondary | ICD-10-CM | POA: Diagnosis not present

## 2021-10-24 DIAGNOSIS — N281 Cyst of kidney, acquired: Secondary | ICD-10-CM | POA: Diagnosis not present

## 2021-10-24 DIAGNOSIS — Z01818 Encounter for other preprocedural examination: Secondary | ICD-10-CM | POA: Diagnosis not present

## 2021-11-07 DIAGNOSIS — Z23 Encounter for immunization: Secondary | ICD-10-CM | POA: Diagnosis not present

## 2021-11-16 ENCOUNTER — Encounter (HOSPITAL_COMMUNITY)
Admission: RE | Admit: 2021-11-16 | Discharge: 2021-11-16 | Disposition: A | Discharge status: Home / Self Care | Payer: Medicare Other | Source: Ambulatory Visit | Attending: Nephrology | Admitting: Nephrology

## 2021-11-16 VITALS — BP 147/85 | HR 83 | Temp 97.4°F | Resp 18

## 2021-11-16 DIAGNOSIS — N185 Chronic kidney disease, stage 5: Secondary | ICD-10-CM | POA: Insufficient documentation

## 2021-11-16 DIAGNOSIS — D631 Anemia in chronic kidney disease: Secondary | ICD-10-CM | POA: Diagnosis not present

## 2021-11-16 DIAGNOSIS — D649 Anemia, unspecified: Secondary | ICD-10-CM | POA: Diagnosis present

## 2021-11-16 LAB — RENAL FUNCTION PANEL
Albumin: 2.9 g/dL — ABNORMAL LOW (ref 3.5–5.0)
Anion gap: 4 — ABNORMAL LOW (ref 5–15)
BUN: 40 mg/dL — ABNORMAL HIGH (ref 6–20)
CO2: 21 mmol/L — ABNORMAL LOW (ref 22–32)
Calcium: 8.6 mg/dL — ABNORMAL LOW (ref 8.9–10.3)
Chloride: 112 mmol/L — ABNORMAL HIGH (ref 98–111)
Creatinine, Ser: 4.19 mg/dL — ABNORMAL HIGH (ref 0.44–1.00)
GFR, Estimated: 12 mL/min — ABNORMAL LOW (ref >60)
Glucose, Bld: 90 mg/dL (ref 70–99)
Phosphorus: 4.6 mg/dL (ref 2.5–4.6)
Potassium: 5 mmol/L (ref 3.5–5.1)
Sodium: 137 mmol/L (ref 135–145)

## 2021-11-16 LAB — IRON AND TIBC
Iron: 61 ug/dL (ref 28–170)
Saturation Ratios: 28 % (ref 10.4–31.8)
TIBC: 220 ug/dL — ABNORMAL LOW (ref 250–450)
UIBC: 159 ug/dL

## 2021-11-16 LAB — POCT HEMOGLOBIN-HEMACUE: Hemoglobin: 9.3 g/dL — ABNORMAL LOW (ref 12.0–15.0)

## 2021-11-16 MED ORDER — EPOETIN ALFA-EPBX 10000 UNIT/ML IJ SOLN
INTRAMUSCULAR | Status: AC
  Administered 2021-11-16: 20000 [IU] via SUBCUTANEOUS
  Filled 2021-11-16: qty 2

## 2021-11-16 MED ORDER — EPOETIN ALFA-EPBX 10000 UNIT/ML IJ SOLN: 20000.0000 [IU] | INTRAMUSCULAR | Status: DC

## 2021-11-20 NOTE — Progress Notes (Addendum)
COVID Vaccine Completed: yes x4  Date of COVID positive in last 90 days: no  PCP - Early Osmond, MD Cardiologist - n/a  Chest x-ray - 05/07/21 Epic EKG - 06/08/21 Epic Stress Test - 08/03/21 CE ECHO - 08/03/21 CE Cardiac Cath - n/a Pacemaker/ICD device last checked: n/a Spinal Cord Stimulator: n/a  Bowel Prep - no  Sleep Study - n/a CPAP -   Fasting Blood Sugar - no per pt, was borderline years ago before she lost 100lbs Checks Blood Sugar _____ times a day  Blood Thinner Instructions: n/a Aspirin Instructions: Last Dose:  Activity level: Can go up a flight of stairs and perform activities of daily living without stopping and without symptoms of chest pain or shortness of breath.     Anesthesia review: Hgb 9.9, creatinine 5.19, K+ 5.9  Patient denies shortness of breath, fever, cough and chest pain at PAT appointment  Patient verbalized understanding of instructions that were given to them at the PAT appointment. Patient was also instructed that they will need to review over the PAT instructions again at home before surgery.

## 2021-11-20 NOTE — Patient Instructions (Addendum)
DUE TO COVID-19 ONLY TWO VISITORS  (aged 60 and older)  ARE ALLOWED TO COME WITH YOU AND STAY IN THE WAITING ROOM ONLY DURING PRE OP AND PROCEDURE.   **NO VISITORS ARE ALLOWED IN THE SHORT STAY AREA OR RECOVERY ROOM!!**    Your procedure is scheduled on: 11/29/21   Report to [facility] Main Entrance    Report to admitting at 5:15 AM   Call this number if you have problems the morning of surgery [phone]   Do not eat food :After Midnight.   After Midnight you may have the following liquids until 4:30 AM DAY OF SURGERY  Water Black Coffee (sugar ok, NO MILK/CREAM OR CREAMERS)  Tea (sugar ok, NO MILK/CREAM OR CREAMERS) regular and decaf                             Plain Jell-O (NO RED)                                           Fruit ices (not with fruit pulp, NO RED)                                     Popsicles (NO RED)                                                                  Juice: apple, WHITE grape, WHITE cranberry Sports drinks like Gatorade (NO RED) Clear broth(vegetable,chicken,beef)     The day of surgery:  Drink ONE (1) Pre-Surgery Ensure at 4:30 AM the morning of surgery. Drink in one sitting. Do not sip.  This drink was given to you during your hospital  pre-op appointment visit. Nothing else to drink after completing the  Pre-Surgery Ensure.          If you have questions, please contact your surgeon's office.   FOLLOW BOWEL PREP AND ANY ADDITIONAL PRE OP INSTRUCTIONS YOU RECEIVED FROM YOUR SURGEON'S OFFICE!!!     Oral Hygiene is also important to reduce your risk of infection.                                    Remember - BRUSH YOUR TEETH THE MORNING OF SURGERY WITH YOUR REGULAR TOOTHPASTE   Take these medicines the morning of surgery with A SIP OF WATER: Tylenol, Zyrtec, Pantoprazole, Thyroid armour                               You may not have any metal on your body including hair pins, jewelry, and body piercing             Do not wear  make-up, lotions, powders, perfumes, or deodorant  Do not wear nail polish including gel and S&S, artificial/acrylic nails, or any other type of covering on natural nails including finger and toenails. If you have artificial nails, gel coating, etc.  that needs to be removed by a nail salon please have this removed prior to surgery or surgery may need to be canceled/ delayed if the surgeon/ anesthesia feels like they are unable to be safely monitored.   Do not shave  48 hours prior to surgery.    Do not bring valuables to the hospital. [place].   Contacts, dentures or bridgework may not be worn into surgery.  DO NOT [place]. PHARMACY WILL DISPENSE MEDICATIONS LISTED ON YOUR MEDICATION LIST TO YOU DURING YOUR ADMISSION [place]!    Patients discharged on the day of surgery will not be allowed to drive home.  Someone NEEDS to stay with you for the first 24 hours after anesthesia.   Special Instructions: Bring a copy of your healthcare power of attorney and living will documents         the day of surgery if you haven't scanned them before.              Please read over the following fact sheets you were given: IF YOU HAVE QUESTIONS ABOUT YOUR PRE-OP INSTRUCTIONS PLEASE CALL [facility] - Preparing for Surgery Before surgery, you can play an important role.  Because skin is not sterile, your skin needs to be as free of germs as possible.  You can reduce the number of germs on your skin by washing with CHG (chlorahexidine gluconate) soap before surgery.  CHG is an antiseptic cleaner which kills germs and bonds with the skin to continue killing germs even after washing. Please DO NOT use if you have an allergy to CHG or antibacterial soaps.  If your skin becomes reddened/irritated stop using the CHG and inform your nurse when you arrive at Short Stay. Do not shave (including legs  and underarms) for at least 48 hours prior to the first CHG shower.  You may shave your face/neck.  Please follow these instructions carefully:  1.  Shower with CHG Soap the night before surgery and the  morning of surgery.  2.  If you choose to wash your hair, wash your hair first as usual with your normal  shampoo.  3.  After you shampoo, rinse your hair and body thoroughly to remove the shampoo.                             4.  Use CHG as you would any other liquid soap.  You can apply chg directly to the skin and wash.  Gently with a scrungie or clean washcloth.  5.  Apply the CHG Soap to your body ONLY FROM THE NECK DOWN.   Do   not use on face/ open                           Wound or open sores. Avoid contact with eyes, ears mouth and   genitals (private parts).                       Wash face,  Genitals (private parts) with your normal soap.             6.  Wash thoroughly, paying special attention to the area where your    surgery  will be performed.  7.  Thoroughly rinse your body with warm water from the neck down.  8.  DO NOT shower/wash with your normal soap after using and rinsing off the CHG Soap.                9.  Pat yourself dry with a clean towel.            10.  Wear clean pajamas.            11.  Place clean sheets on your bed the night of your first shower and do not  sleep with pets. Day of Surgery : Do not apply any lotions/deodorants the morning of surgery.  Please wear clean clothes to the hospital/surgery center.  FAILURE TO FOLLOW THESE INSTRUCTIONS MAY RESULT IN THE CANCELLATION OF YOUR SURGERY  PATIENT SIGNATURE_________________________________  NURSE SIGNATURE__________________________________  ________________________________________________________________________

## 2021-11-22 ENCOUNTER — Encounter (HOSPITAL_COMMUNITY)
Admission: RE | Admit: 2021-11-22 | Discharge: 2021-11-22 | Disposition: A | Discharge status: Home / Self Care | Payer: Medicare Other | Source: Ambulatory Visit | Attending: Surgery | Admitting: Surgery

## 2021-11-22 ENCOUNTER — Encounter (HOSPITAL_COMMUNITY): Payer: Self-pay

## 2021-11-22 VITALS — BP 145/76 | HR 75 | Temp 98.1°F | Resp 16 | Ht 62.0 in | Wt 219.0 lb

## 2021-11-22 DIAGNOSIS — I10 Essential (primary) hypertension: Secondary | ICD-10-CM | POA: Diagnosis not present

## 2021-11-22 DIAGNOSIS — Z01812 Encounter for preprocedural laboratory examination: Secondary | ICD-10-CM | POA: Diagnosis not present

## 2021-11-22 DIAGNOSIS — R7303 Prediabetes: Secondary | ICD-10-CM | POA: Insufficient documentation

## 2021-11-22 HISTORY — DX: Thyrotoxicosis, unspecified without thyrotoxic crisis or storm: E05.90

## 2021-11-22 LAB — BASIC METABOLIC PANEL
Anion gap: 4 — ABNORMAL LOW (ref 5–15)
BUN: 48 mg/dL — ABNORMAL HIGH (ref 6–20)
CO2: 22 mmol/L (ref 22–32)
Calcium: 8.9 mg/dL (ref 8.9–10.3)
Chloride: 114 mmol/L — ABNORMAL HIGH (ref 98–111)
Creatinine, Ser: 5.13 mg/dL — ABNORMAL HIGH (ref 0.44–1.00)
GFR, Estimated: 9 mL/min — ABNORMAL LOW (ref >60)
Glucose, Bld: 86 mg/dL (ref 70–99)
Potassium: 5.9 mmol/L — ABNORMAL HIGH (ref 3.5–5.1)
Sodium: 140 mmol/L (ref 135–145)

## 2021-11-22 LAB — CBC
HCT: 32.3 % — ABNORMAL LOW (ref 36.0–46.0)
Hemoglobin: 9.9 g/dL — ABNORMAL LOW (ref 12.0–15.0)
MCH: 29.3 pg (ref 26.0–34.0)
MCHC: 30.7 g/dL (ref 30.0–36.0)
MCV: 95.6 fL (ref 80.0–100.0)
Platelets: 428 10*3/uL — ABNORMAL HIGH (ref 150–400)
RBC: 3.38 MIL/uL — ABNORMAL LOW (ref 3.87–5.11)
RDW: 15.7 % — ABNORMAL HIGH (ref 11.5–15.5)
WBC: 6.3 10*3/uL (ref 4.0–10.5)
nRBC: 0 % (ref 0.0–0.2)

## 2021-11-22 NOTE — Progress Notes (Signed)
Creatinine 5.19, K+ 5.9, Hgb 9.9, results routed to Dr. Ninfa Linden.

## 2021-11-26 DIAGNOSIS — J018 Other acute sinusitis: Secondary | ICD-10-CM | POA: Diagnosis not present

## 2021-11-26 DIAGNOSIS — R058 Other specified cough: Secondary | ICD-10-CM | POA: Diagnosis not present

## 2021-11-26 DIAGNOSIS — T464X5A Adverse effect of angiotensin-converting-enzyme inhibitors, initial encounter: Secondary | ICD-10-CM | POA: Diagnosis not present

## 2021-11-26 DIAGNOSIS — I120 Hypertensive chronic kidney disease with stage 5 chronic kidney disease or end stage renal disease: Secondary | ICD-10-CM | POA: Diagnosis not present

## 2021-11-26 DIAGNOSIS — N2581 Secondary hyperparathyroidism of renal origin: Secondary | ICD-10-CM | POA: Diagnosis not present

## 2021-11-26 DIAGNOSIS — D631 Anemia in chronic kidney disease: Secondary | ICD-10-CM | POA: Diagnosis not present

## 2021-11-26 DIAGNOSIS — M069 Rheumatoid arthritis, unspecified: Secondary | ICD-10-CM | POA: Diagnosis not present

## 2021-11-26 DIAGNOSIS — Z903 Acquired absence of stomach [part of]: Secondary | ICD-10-CM | POA: Diagnosis not present

## 2021-11-26 DIAGNOSIS — N185 Chronic kidney disease, stage 5: Secondary | ICD-10-CM | POA: Diagnosis not present

## 2021-11-26 DIAGNOSIS — R809 Proteinuria, unspecified: Secondary | ICD-10-CM | POA: Diagnosis not present

## 2021-11-26 DIAGNOSIS — L732 Hidradenitis suppurativa: Secondary | ICD-10-CM | POA: Diagnosis not present

## 2021-11-26 DIAGNOSIS — N281 Cyst of kidney, acquired: Secondary | ICD-10-CM | POA: Diagnosis not present

## 2021-11-28 NOTE — H&P (Signed)
PROVIDER: Beverlee Nims, MD  MRN: [id number] DOB: 11-30-1960  Subjective   Chief Complaint: Re-Check (Hidradenitis//)   History of Present Illness: Elizabeth Griffin is a 61 y.o. female who is seeny for a long-term follow-up regarding her hidradenitis. She reports that she continues to have significant flare with multiple draining sinus tracts on her left posterior thigh. This is not improved despite oral and topical clindamycin. She reports significant discomfort. Again, she has chronic kidney disease and has a fistula in place but has not had to start dialysis yet. She is currently on the transplant list for a kidney transplant..    Review of Systems: A complete review of systems was obtained from the patient. I have reviewed this information and discussed as appropriate with the patient. See HPI as well for other ROS.  ROS   Medical History: Past Medical History:  Diagnosis Date   Anemia   Anxiety   Arthritis   Chronic kidney disease   GERD (gastroesophageal reflux disease)   Patient Active Problem List  Diagnosis   Allergic rhinitis   Anemia   Anxiety   Atrophic vaginitis   Breasts asymmetrical   Chronic kidney disease, stage 4 (severe) (CMS-HCC)   Chronic kidney disease, stage 5 (CMS-HCC)   Chronic renal failure syndrome   Colon cancer screening   Hypothyroidism, unspecified   Disorder of thyroid gland   Diverticular disease of colon   Essential hypertension   Hypertensive disorder   Gastro-esophageal reflux disease without esophagitis   Generalized anxiety disorder   Genital herpes   Inflammatory arthritis   Osteoarthritis   Rheumatoid arthritis (CMS-HCC)   Kidney disease   Menopausal symptom   Morbid (severe) obesity due to excess calories (CMS-HCC)   Obesity   Other specified postprocedural states   Prediabetes   Primary insomnia   Rheumatoid aortitis   S/P laparoscopic sleeve gastrectomy   Sickle cell trait (CMS-HCC)   Suppurative  hidradenitis   Transplanted organ and tissue status, unspecified   Vertigo   Vitamin D deficiency   Past Surgical History:  Procedure Laterality Date   fasler in right arm   gastric sleeve N/A    Allergies  Allergen Reactions   Ibuprofen Other (See Comments)  Causes ulcer  Causes ulcer    Lisinopril Other (See Comments)  Caused alopecia dehydration   Other Hives   Shellfish Containing Products Other (See Comments) and Hives   Sulfa (Sulfonamide Antibiotics) Nausea And Vomiting   Sulfamethoxazole-Trimethoprim Nausea And Vomiting and Other (See Comments)   Adhesive Hives   Current Outpatient Medications on File Prior to Visit  Medication Sig Dispense Refill   pantoprazole (PROTONIX) 40 MG DR tablet pantoprazole 40 mg tablet,delayed release TAKE 1 TABLET BY MOUTH EVERY DAY   thyroid (ARMOUR THYROID) 30 mg tablet Armour Thyroid 30 mg tablet TAKE 1 TABLET BY MOUTH ONCE A DAY   No current facility-administered medications on file prior to visit.   Family History  Problem Relation Age of Onset   Obesity Mother   High blood pressure (Hypertension) Mother   Stroke Father   High blood pressure (Hypertension) Father    Social History   Tobacco Use  Smoking Status Never  Smokeless Tobacco Never    Social History   Socioeconomic History   Marital status: Unknown  Tobacco Use   Smoking status: Never   Smokeless tobacco: Never  Vaping Use   Vaping Use: Never used  Substance and Sexual Activity   Alcohol use: Never   Drug  use: Never   Objective:   There were no vitals filed for this visit.  There is no height or weight on file to calculate BMI.  Physical Exam   On exam, she has a large area of hidradenitis on her left posterior thigh measuring at least 8 x 6 cm with multiple draining sinus tracts and erythema. I treated multiple tracks with silver nitrate.  Labs, Imaging and Diagnostic Testing:  I have reviewed her most recent notes in epic  Assessment and  Plan:   Diagnoses and all orders for this visit:  Hidradenitis    At this point, her hidradenitis in the left posterior thigh is not improving and is actually becoming worse. This area needs wide excision to get the chronic infection and draining sinus tracts under control prior to any transplant surgery. Because of her discomfort and drainage she is eager to have surgery. We discussed the risk of surgery which includes but is not limited to bleeding, infection, recurrence as this is not a curable disease, having a chronic open wound, worsening of her kidney function with surgery and the need for dialysis, cardiopulmonary issues, etc. She understands and wished to proceed with surgery which will be scheduled.

## 2021-11-29 ENCOUNTER — Other Ambulatory Visit: Payer: Self-pay

## 2021-11-29 ENCOUNTER — Ambulatory Visit (HOSPITAL_COMMUNITY)
Admission: RE | Admit: 2021-11-29 | Discharge: 2021-11-29 | Disposition: A | Discharge status: Home / Self Care | Payer: Medicare Other | Attending: Surgery | Admitting: Surgery

## 2021-11-29 ENCOUNTER — Encounter (HOSPITAL_COMMUNITY): Payer: Self-pay | Admitting: Surgery

## 2021-11-29 ENCOUNTER — Encounter (HOSPITAL_COMMUNITY)
Admission: RE | Disposition: A | Discharge status: Home / Self Care | Payer: Self-pay | Source: Home / Self Care | Attending: Surgery

## 2021-11-29 ENCOUNTER — Ambulatory Visit (HOSPITAL_COMMUNITY): Payer: Medicare Other | Admitting: Physician Assistant

## 2021-11-29 ENCOUNTER — Ambulatory Visit (HOSPITAL_BASED_OUTPATIENT_CLINIC_OR_DEPARTMENT_OTHER): Payer: Medicare Other | Admitting: Anesthesiology

## 2021-11-29 DIAGNOSIS — I120 Hypertensive chronic kidney disease with stage 5 chronic kidney disease or end stage renal disease: Secondary | ICD-10-CM | POA: Insufficient documentation

## 2021-11-29 DIAGNOSIS — L732 Hidradenitis suppurativa: Secondary | ICD-10-CM

## 2021-11-29 DIAGNOSIS — F419 Anxiety disorder, unspecified: Secondary | ICD-10-CM

## 2021-11-29 DIAGNOSIS — M069 Rheumatoid arthritis, unspecified: Secondary | ICD-10-CM | POA: Insufficient documentation

## 2021-11-29 DIAGNOSIS — L720 Epidermal cyst: Secondary | ICD-10-CM | POA: Insufficient documentation

## 2021-11-29 DIAGNOSIS — E0590 Thyrotoxicosis, unspecified without thyrotoxic crisis or storm: Secondary | ICD-10-CM | POA: Insufficient documentation

## 2021-11-29 DIAGNOSIS — N186 End stage renal disease: Secondary | ICD-10-CM

## 2021-11-29 DIAGNOSIS — I10 Essential (primary) hypertension: Secondary | ICD-10-CM

## 2021-11-29 DIAGNOSIS — K219 Gastro-esophageal reflux disease without esophagitis: Secondary | ICD-10-CM | POA: Insufficient documentation

## 2021-11-29 DIAGNOSIS — F411 Generalized anxiety disorder: Secondary | ICD-10-CM | POA: Diagnosis not present

## 2021-11-29 DIAGNOSIS — R7303 Prediabetes: Secondary | ICD-10-CM

## 2021-11-29 HISTORY — PX: CYST EXCISION: SHX5701

## 2021-11-29 SURGERY — CYST REMOVAL
Anesthesia: General | Laterality: Left

## 2021-11-29 MED ORDER — MIDAZOLAM HCL 2 MG/2ML IJ SOLN
INTRAMUSCULAR | Status: DC | PRN
  Administered 2021-11-29: 2 mg via INTRAVENOUS

## 2021-11-29 MED ORDER — LIDOCAINE HCL (PF) 2 % IJ SOLN
INTRAMUSCULAR | Status: AC
  Filled 2021-11-29: qty 5

## 2021-11-29 MED ORDER — FENTANYL CITRATE PF 50 MCG/ML IJ SOSY
25.0000 ug | PREFILLED_SYRINGE | INTRAMUSCULAR | Status: DC | PRN
  Administered 2021-11-29: 50 ug via INTRAVENOUS

## 2021-11-29 MED ORDER — FENTANYL CITRATE PF 50 MCG/ML IJ SOSY
PREFILLED_SYRINGE | INTRAMUSCULAR | Status: AC
  Filled 2021-11-29: qty 1

## 2021-11-29 MED ORDER — FENTANYL CITRATE (PF) 100 MCG/2ML IJ SOLN
INTRAMUSCULAR | Status: AC
  Filled 2021-11-29: qty 2

## 2021-11-29 MED ORDER — AMISULPRIDE (ANTIEMETIC) 5 MG/2ML IV SOLN: 10.0000 mg | Freq: Once | INTRAVENOUS | Status: DC | PRN

## 2021-11-29 MED ORDER — ACETAMINOPHEN 10 MG/ML IV SOLN: 1000.0000 mg | Freq: Once | INTRAVENOUS | Status: DC | PRN

## 2021-11-29 MED ORDER — FENTANYL CITRATE (PF) 100 MCG/2ML IJ SOLN
INTRAMUSCULAR | Status: DC | PRN
  Administered 2021-11-29: 100 ug via INTRAVENOUS
  Administered 2021-11-29 (×2): 50 ug via INTRAVENOUS

## 2021-11-29 MED ORDER — CHLORHEXIDINE GLUCONATE CLOTH 2 % EX PADS: 6.0000 | MEDICATED_PAD | Freq: Once | CUTANEOUS | Status: DC

## 2021-11-29 MED ORDER — BUPIVACAINE-EPINEPHRINE 0.25% -1:200000 IJ SOLN
INTRAMUSCULAR | Status: DC | PRN
  Administered 2021-11-29: 20 mL

## 2021-11-29 MED ORDER — ROCURONIUM BROMIDE 10 MG/ML (PF) SYRINGE
PREFILLED_SYRINGE | INTRAVENOUS | Status: AC
  Filled 2021-11-29: qty 10

## 2021-11-29 MED ORDER — ACETAMINOPHEN 500 MG PO TABS
1000.0000 mg | ORAL_TABLET | ORAL | Status: AC
  Administered 2021-11-29: 1000 mg via ORAL
  Filled 2021-11-29: qty 2

## 2021-11-29 MED ORDER — ONDANSETRON HCL 4 MG/2ML IJ SOLN
INTRAMUSCULAR | Status: AC
  Filled 2021-11-29: qty 2

## 2021-11-29 MED ORDER — ACETAMINOPHEN 160 MG/5ML PO SOLN: 325.0000 mg | ORAL | Status: DC | PRN

## 2021-11-29 MED ORDER — ROCURONIUM BROMIDE 10 MG/ML (PF) SYRINGE
PREFILLED_SYRINGE | INTRAVENOUS | Status: DC | PRN
  Administered 2021-11-29: 50 mg via INTRAVENOUS

## 2021-11-29 MED ORDER — OXYCODONE HCL 5 MG/5ML PO SOLN: 5.0000 mg | Freq: Once | ORAL | Status: AC | PRN

## 2021-11-29 MED ORDER — OXYCODONE HCL 5 MG PO TABS: 5.0000 mg | ORAL_TABLET | Freq: Four times a day (QID) | ORAL | 0 refills | Status: AC | PRN

## 2021-11-29 MED ORDER — PROPOFOL 10 MG/ML IV BOLUS
INTRAVENOUS | Status: AC
Start: 2021-11-29 — End: ?
  Filled 2021-11-29: qty 20

## 2021-11-29 MED ORDER — DEXAMETHASONE SODIUM PHOSPHATE 10 MG/ML IJ SOLN
INTRAMUSCULAR | Status: DC | PRN
  Administered 2021-11-29: 10 mg via INTRAVENOUS

## 2021-11-29 MED ORDER — LIDOCAINE 2% (20 MG/ML) 5 ML SYRINGE
INTRAMUSCULAR | Status: DC | PRN
  Administered 2021-11-29: 40 mg via INTRAVENOUS

## 2021-11-29 MED ORDER — ENSURE PRE-SURGERY PO LIQD
296.0000 mL | Freq: Once | ORAL | Status: DC
  Filled 2021-11-29: qty 296

## 2021-11-29 MED ORDER — DEXAMETHASONE SODIUM PHOSPHATE 10 MG/ML IJ SOLN
INTRAMUSCULAR | Status: AC
  Filled 2021-11-29: qty 1

## 2021-11-29 MED ORDER — 0.9 % SODIUM CHLORIDE (POUR BTL) OPTIME
TOPICAL | Status: DC | PRN
  Administered 2021-11-29: 1000 mL

## 2021-11-29 MED ORDER — ONDANSETRON HCL 4 MG/2ML IJ SOLN
INTRAMUSCULAR | Status: DC | PRN
  Administered 2021-11-29: 4 mg via INTRAVENOUS

## 2021-11-29 MED ORDER — CHLORHEXIDINE GLUCONATE 0.12 % MT SOLN
15.0000 mL | Freq: Once | OROMUCOSAL | Status: AC
Start: 2021-11-29 — End: 2021-11-29
  Administered 2021-11-29: 15 mL via OROMUCOSAL

## 2021-11-29 MED ORDER — OXYCODONE HCL 5 MG PO TABS
5.0000 mg | ORAL_TABLET | Freq: Once | ORAL | Status: AC | PRN
  Administered 2021-11-29: 5 mg via ORAL

## 2021-11-29 MED ORDER — CEFAZOLIN SODIUM-DEXTROSE 2-4 GM/100ML-% IV SOLN
2.0000 g | INTRAVENOUS | Status: AC
  Administered 2021-11-29: 2 g via INTRAVENOUS
  Filled 2021-11-29: qty 100

## 2021-11-29 MED ORDER — ACETAMINOPHEN 325 MG PO TABS: 325.0000 mg | ORAL_TABLET | ORAL | Status: DC | PRN

## 2021-11-29 MED ORDER — PROPOFOL 10 MG/ML IV BOLUS
INTRAVENOUS | Status: DC | PRN
  Administered 2021-11-29: 140 mg via INTRAVENOUS

## 2021-11-29 MED ORDER — SUGAMMADEX SODIUM 500 MG/5ML IV SOLN
INTRAVENOUS | Status: DC | PRN
  Administered 2021-11-29: 400 mg via INTRAVENOUS

## 2021-11-29 MED ORDER — OXYCODONE HCL 5 MG PO TABS
ORAL_TABLET | ORAL | Status: AC
  Filled 2021-11-29: qty 1

## 2021-11-29 MED ORDER — ORAL CARE MOUTH RINSE
15.0000 mL | Freq: Once | OROMUCOSAL | Status: AC
Start: 2021-11-29 — End: 2021-11-29

## 2021-11-29 MED ORDER — SODIUM CHLORIDE 0.9 % IV SOLN: INTRAVENOUS | Status: DC | PRN

## 2021-11-29 MED ORDER — LACTATED RINGERS IV SOLN: INTRAVENOUS | Status: DC

## 2021-11-29 MED ORDER — SUGAMMADEX SODIUM 500 MG/5ML IV SOLN
INTRAVENOUS | Status: AC
  Filled 2021-11-29: qty 5

## 2021-11-29 MED ORDER — BUPIVACAINE-EPINEPHRINE (PF) 0.25% -1:200000 IJ SOLN
INTRAMUSCULAR | Status: AC
  Filled 2021-11-29: qty 30

## 2021-11-29 MED ORDER — MIDAZOLAM HCL 2 MG/2ML IJ SOLN
INTRAMUSCULAR | Status: AC
  Filled 2021-11-29: qty 2

## 2021-11-29 SURGICAL SUPPLY — 33 items
APL PRP STRL LF DISP 70% ISPRP (MISCELLANEOUS) ×1
APL SKNCLS STERI-STRIP NONHPOA (GAUZE/BANDAGES/DRESSINGS)
BAG COUNTER SPONGE SURGICOUNT (BAG) IMPLANT
BAG SPNG CNTER NS LX DISP (BAG)
BENZOIN TINCTURE PRP APPL 2/3 (GAUZE/BANDAGES/DRESSINGS) IMPLANT
BLADE SURG 15 STRL LF DISP TIS (BLADE) ×1 IMPLANT
BLADE SURG 15 STRL SS (BLADE) ×2
CHLORAPREP W/TINT 26 (MISCELLANEOUS) ×2 IMPLANT
DRAPE LAPAROSCOPIC ABDOMINAL (DRAPES) IMPLANT
DRAPE LAPAROTOMY T 98X78 PEDS (DRAPES) IMPLANT
DRSG PAD ABDOMINAL 8X10 ST (GAUZE/BANDAGES/DRESSINGS) ×1 IMPLANT
ELECT REM PT RETURN 15FT ADLT (MISCELLANEOUS) ×2 IMPLANT
GAUZE SPONGE 4X4 12PLY STRL (GAUZE/BANDAGES/DRESSINGS) ×1 IMPLANT
GLOVE BIO SURGEON STRL SZ7.5 (GLOVE) ×2 IMPLANT
GOWN STRL REUS W/ TWL XL LVL3 (GOWN DISPOSABLE) ×2 IMPLANT
GOWN STRL REUS W/TWL XL LVL3 (GOWN DISPOSABLE) ×4
KIT BASIN OR (CUSTOM PROCEDURE TRAY) ×2 IMPLANT
KIT TURNOVER KIT A (KITS) IMPLANT
NDL HYPO 25X1 1.5 SAFETY (NEEDLE) ×1 IMPLANT
NEEDLE HYPO 25X1 1.5 SAFETY (NEEDLE) ×2 IMPLANT
PACK BASIC VI WITH GOWN DISP (CUSTOM PROCEDURE TRAY) ×2 IMPLANT
PENCIL SMOKE EVACUATOR (MISCELLANEOUS) IMPLANT
SPIKE FLUID TRANSFER (MISCELLANEOUS) ×2 IMPLANT
STRIP CLOSURE SKIN 1/2X4 (GAUZE/BANDAGES/DRESSINGS) IMPLANT
SUT ETHILON 2 0 PS N (SUTURE) ×9 IMPLANT
SUT MNCRL AB 4-0 PS2 18 (SUTURE) ×2 IMPLANT
SUT VIC AB 2-0 CT1 27 (SUTURE) ×2
SUT VIC AB 2-0 CT1 TAPERPNT 27 (SUTURE) IMPLANT
SUT VIC AB 3-0 SH 27 (SUTURE) ×2
SUT VIC AB 3-0 SH 27XBRD (SUTURE) ×1 IMPLANT
SYR CONTROL 10ML LL (SYRINGE) ×2 IMPLANT
TAPE CLOTH SURG 6X10 WHT LF (GAUZE/BANDAGES/DRESSINGS) ×1 IMPLANT
TOWEL OR 17X26 10 PK STRL BLUE (TOWEL DISPOSABLE) ×2 IMPLANT

## 2021-11-29 NOTE — Op Note (Signed)
WIDE EXCISION HIDRADENITIS LEFT POSTERIOR THIGH  Procedure Note  Elizabeth Griffin 11/29/2021   Pre-op Diagnosis: HIDRADENITIS     Post-op Diagnosis: same  Procedure(s): WIDE EXCISION HIDRADENITIS LEFT POSTERIOR THIGH (15 cm x 9 cm)  Surgeon(s): Coralie Keens, MD  Anesthesia: General  Staff:  Circulator: Lorene Dy, RN; Thana Ates, RN Scrub Person: Lois Huxley  Estimated Blood Loss: Minimal               Specimens: sent to path  Indications: This is a 61 year old female with significant hidradenitis and a large area of draining sinus tracts on her left posterior thigh.  The decision has been made to proceed with wide excision of this area  Findings: The hidradenitis was widely excised with a scalpel.  This involved only skin and subcutaneous tissue and some underlying fat.  The specimen measured 15 cm x 9 cm  Procedure: The patient is brought to operating identifies correct patient.  She is placed upon the operating table and general anesthesia was induced.  She was then frog-legged.  Her left posterior medial thigh was then prepped and draped in usual sterile fashion.  Using #10 blade I performed a wide elliptical incision around all the areas of hidradenitis.  I then took this down into the subcutaneous tissue and fatty tissue with electrocautery.  I then completed the wide excision with electrocautery.  The tissue removed measured 15 cm x 9 cm in size.  It was sent to pathology for evaluation.  I achieved hemostasis with the cautery.  I anesthetized the area circumferentially with Marcaine.  I then closed the incision with 2 separate 2-0 Vicryl sutures and then interrupted and horizontal mattress 2-0 nylon sutures.  Hemostasis appeared to be achieved.  Gauze and tape were then applied on the wound.  The patient tolerated the procedure well.  All the counts were correct at the end of the procedure.  The patient was then extubated in the operating room and  taken in stable condition to the recovery room.          Coralie Keens   Date: 11/29/2021  Time: 8:22 AM

## 2021-11-29 NOTE — Anesthesia Preprocedure Evaluation (Addendum)
Anesthesia Evaluation  Patient identified by MRN, date of birth, ID band Patient awake    Reviewed: Allergy & Precautions, NPO status , Patient's Chart, lab work & pertinent test results  Airway Mallampati: I  TM Distance: >3 FB Neck ROM: Full    Dental  (+) Teeth Intact, Dental Advisory Given   Pulmonary neg pulmonary ROS,    breath sounds clear to auscultation       Cardiovascular hypertension,  Rhythm:Regular Rate:Normal     Neuro/Psych Anxiety negative neurological ROS     GI/Hepatic Neg liver ROS, GERD  ,  Endo/Other  Hyperthyroidism   Renal/GU ESRF and CRFRenal disease     Musculoskeletal  (+) Arthritis , Rheumatoid disorders,    Abdominal (+) + obese,   Peds  Hematology   Anesthesia Other Findings   Reproductive/Obstetrics                            Anesthesia Physical Anesthesia Plan  ASA: 3  Anesthesia Plan: General   Post-op Pain Management:    Induction: Intravenous  PONV Risk Score and Plan: 4 or greater and Ondansetron, Dexamethasone, Midazolam and Scopolamine patch - Pre-op  Airway Management Planned: LMA and Oral ETT  Additional Equipment: None  Intra-op Plan:   Post-operative Plan: Extubation in OR  Informed Consent: I have reviewed the patients History and Physical, chart, labs and discussed the procedure including the risks, benefits and alternatives for the proposed anesthesia with the patient or authorized representative who has indicated his/her understanding and acceptance.       Plan Discussed with: CRNA  Anesthesia Plan Comments:        Anesthesia Quick Evaluation

## 2021-11-29 NOTE — Anesthesia Postprocedure Evaluation (Signed)
Anesthesia Post Note  Patient: Elizabeth Griffin  Procedure(s) Performed: WIDE EXCISION HIDRADENITIS LEFT POSTERIOR THIGH (Left)     Patient location during evaluation: PACU Anesthesia Type: General Level of consciousness: awake and alert Pain management: pain level controlled Vital Signs Assessment: post-procedure vital signs reviewed and stable Respiratory status: spontaneous breathing, nonlabored ventilation, respiratory function stable and patient connected to nasal cannula oxygen Cardiovascular status: blood pressure returned to baseline and stable Postop Assessment: no apparent nausea or vomiting Anesthetic complications: no   No notable events documented.  Last Vitals:  Vitals:   11/29/21 0906 11/29/21 0915  BP: (!) 132/93 132/86  Pulse: 94 93  Resp: 20 18  Temp: 36.4 C   SpO2: 96% 93%    Last Pain:  Vitals:   11/29/21 0906  TempSrc: Oral  PainSc: 0-No pain                 [name]

## 2021-11-29 NOTE — Discharge Instructions (Signed)
You may shower starting tomorrow  Cover the wound with dry gauze and change at least daily and as needed.  Expect the wound to drain daily  You may use ice pack and exercising Tylenol also for pain  No vigorous activity until the sutures are removed

## 2021-11-29 NOTE — Interval H&P Note (Signed)
History and Physical Interval Note:no change in H and P  11/29/2021 7:13 AM  Elizabeth Griffin  has presented today for surgery, with the diagnosis of HIDRADENITIS.  The various methods of treatment have been discussed with the patient and family. After consideration of risks, benefits and other options for treatment, the patient has consented to  Procedure(s): WIDE EXCISION HIDRADENITIS LEFT POSTERIOR THIGH (Left) as a surgical intervention.  The patient's history has been reviewed, patient examined, no change in status, stable for surgery.  I have reviewed the patient's chart and labs.  Questions were answered to the patient's satisfaction.     Coralie Keens

## 2021-11-29 NOTE — Anesthesia Procedure Notes (Addendum)
Procedure Name: Intubation Date/Time: 11/29/2021 7:32 AM  Performed by: [name], MDPre-anesthesia Checklist: Patient identified, Emergency Drugs available, Suction available and Patient being monitored Patient Re-evaluated:Patient Re-evaluated prior to induction Oxygen Delivery Method: Circle system utilized Preoxygenation: Pre-oxygenation with 100% oxygen Induction Type: IV induction Ventilation: Mask ventilation without difficulty Laryngoscope Size: Mac and 4 Grade View: Grade I Tube type: Oral Number of attempts: 2 Airway Equipment and Method: Stylet and Oral airway Placement Confirmation: ETT inserted through vocal cords under direct vision, positive ETCO2 and breath sounds checked- equal and bilateral Secured at: 22 cm Tube secured with: Tape Dental Injury: Teeth and Oropharynx as per pre-operative assessment  Comments: CRNA x1, Grade IV view. MDA x1, Grade 1 view, successful intubation.

## 2021-11-29 NOTE — Transfer of Care (Signed)
Immediate Anesthesia Transfer of Care Note  Patient: Elizabeth Griffin  Procedure(s) Performed: WIDE EXCISION HIDRADENITIS LEFT POSTERIOR THIGH (Left)  Patient Location: PACU  Anesthesia Type:General  Level of Consciousness: awake, alert  and oriented  Airway & Oxygen Therapy: Patient Spontanous Breathing and Patient connected to face mask oxygen  Post-op Assessment: Report given to RN and Post -op Vital signs reviewed and stable  Post vital signs: Reviewed and stable  Last Vitals:  Vitals Value Taken Time  BP 146/83 11/29/21 0824  Temp    Pulse 102 11/29/21 0827  Resp 21 11/29/21 0827  SpO2 97 % 11/29/21 0827  Vitals shown include unvalidated device data.  Last Pain:  Vitals:   11/29/21 0620  TempSrc:   PainSc: 0-No pain         Complications: No notable events documented.

## 2021-11-30 ENCOUNTER — Encounter (HOSPITAL_COMMUNITY): Payer: Self-pay | Admitting: Surgery

## 2021-11-30 LAB — SURGICAL PATHOLOGY

## 2021-12-14 ENCOUNTER — Encounter (HOSPITAL_COMMUNITY)
Admission: RE | Admit: 2021-12-14 | Discharge: 2021-12-14 | Disposition: A | Discharge status: Home / Self Care | Payer: Medicare Other | Source: Ambulatory Visit | Attending: Nephrology | Admitting: Nephrology

## 2021-12-14 VITALS — BP 135/84 | Temp 97.5°F | Resp 18

## 2021-12-14 DIAGNOSIS — D631 Anemia in chronic kidney disease: Secondary | ICD-10-CM | POA: Insufficient documentation

## 2021-12-14 DIAGNOSIS — N185 Chronic kidney disease, stage 5: Secondary | ICD-10-CM | POA: Insufficient documentation

## 2021-12-14 DIAGNOSIS — D649 Anemia, unspecified: Secondary | ICD-10-CM | POA: Insufficient documentation

## 2021-12-14 LAB — RENAL FUNCTION PANEL
Albumin: 3 g/dL — ABNORMAL LOW (ref 3.5–5.0)
Anion gap: 9 (ref 5–15)
BUN: 40 mg/dL — ABNORMAL HIGH (ref 6–20)
CO2: 21 mmol/L — ABNORMAL LOW (ref 22–32)
Calcium: 8.9 mg/dL (ref 8.9–10.3)
Chloride: 109 mmol/L (ref 98–111)
Creatinine, Ser: 4.78 mg/dL — ABNORMAL HIGH (ref 0.44–1.00)
GFR, Estimated: 10 mL/min — ABNORMAL LOW (ref >60)
Glucose, Bld: 100 mg/dL — ABNORMAL HIGH (ref 70–99)
Phosphorus: 4.6 mg/dL (ref 2.5–4.6)
Potassium: 4.4 mmol/L (ref 3.5–5.1)
Sodium: 139 mmol/L (ref 135–145)

## 2021-12-14 LAB — IRON AND TIBC
Iron: 48 ug/dL (ref 28–170)
Saturation Ratios: 20 % (ref 10.4–31.8)
TIBC: 238 ug/dL — ABNORMAL LOW (ref 250–450)
UIBC: 190 ug/dL

## 2021-12-14 LAB — POCT HEMOGLOBIN-HEMACUE: Hemoglobin: 9.4 g/dL — ABNORMAL LOW (ref 12.0–15.0)

## 2021-12-14 LAB — FERRITIN: Ferritin: 548 ng/mL — ABNORMAL HIGH (ref 11–307)

## 2021-12-14 MED ORDER — EPOETIN ALFA-EPBX 40000 UNIT/ML IJ SOLN: 20000.0000 [IU] | INTRAMUSCULAR | Status: DC

## 2021-12-14 MED ORDER — EPOETIN ALFA 20000 UNIT/ML IJ SOLN
20000.0000 [IU] | INTRAMUSCULAR | Status: DC
  Administered 2021-12-14: 20000 [IU] via SUBCUTANEOUS

## 2021-12-14 MED ORDER — EPOETIN ALFA 20000 UNIT/ML IJ SOLN
INTRAMUSCULAR | Status: AC
  Filled 2021-12-14: qty 1

## 2021-12-15 LAB — PTH, INTACT AND CALCIUM
Calcium, Total (PTH): 9.1 mg/dL (ref 8.7–10.2)
PTH: 146 pg/mL — ABNORMAL HIGH (ref 15–65)

## 2021-12-25 DIAGNOSIS — R5383 Other fatigue: Secondary | ICD-10-CM | POA: Diagnosis not present

## 2021-12-25 DIAGNOSIS — Z79899 Other long term (current) drug therapy: Secondary | ICD-10-CM | POA: Diagnosis not present

## 2021-12-25 DIAGNOSIS — M0609 Rheumatoid arthritis without rheumatoid factor, multiple sites: Secondary | ICD-10-CM | POA: Diagnosis not present

## 2021-12-25 DIAGNOSIS — L732 Hidradenitis suppurativa: Secondary | ICD-10-CM | POA: Diagnosis not present

## 2021-12-25 DIAGNOSIS — M2550 Pain in unspecified joint: Secondary | ICD-10-CM | POA: Diagnosis not present

## 2022-01-02 DIAGNOSIS — Z1231 Encounter for screening mammogram for malignant neoplasm of breast: Secondary | ICD-10-CM | POA: Diagnosis not present

## 2022-01-02 DIAGNOSIS — Z1211 Encounter for screening for malignant neoplasm of colon: Secondary | ICD-10-CM | POA: Diagnosis not present

## 2022-01-09 DIAGNOSIS — Z9884 Bariatric surgery status: Secondary | ICD-10-CM | POA: Diagnosis not present

## 2022-01-09 DIAGNOSIS — R058 Other specified cough: Secondary | ICD-10-CM | POA: Diagnosis not present

## 2022-01-09 DIAGNOSIS — N185 Chronic kidney disease, stage 5: Secondary | ICD-10-CM | POA: Diagnosis not present

## 2022-01-09 DIAGNOSIS — Z903 Acquired absence of stomach [part of]: Secondary | ICD-10-CM | POA: Diagnosis not present

## 2022-01-09 DIAGNOSIS — N281 Cyst of kidney, acquired: Secondary | ICD-10-CM | POA: Diagnosis not present

## 2022-01-09 DIAGNOSIS — T464X5A Adverse effect of angiotensin-converting-enzyme inhibitors, initial encounter: Secondary | ICD-10-CM | POA: Diagnosis not present

## 2022-01-09 DIAGNOSIS — D631 Anemia in chronic kidney disease: Secondary | ICD-10-CM | POA: Diagnosis not present

## 2022-01-09 DIAGNOSIS — N2581 Secondary hyperparathyroidism of renal origin: Secondary | ICD-10-CM | POA: Diagnosis not present

## 2022-01-09 DIAGNOSIS — I120 Hypertensive chronic kidney disease with stage 5 chronic kidney disease or end stage renal disease: Secondary | ICD-10-CM | POA: Diagnosis not present

## 2022-01-09 DIAGNOSIS — M069 Rheumatoid arthritis, unspecified: Secondary | ICD-10-CM | POA: Diagnosis not present

## 2022-01-09 DIAGNOSIS — R809 Proteinuria, unspecified: Secondary | ICD-10-CM | POA: Diagnosis not present

## 2022-01-09 DIAGNOSIS — L732 Hidradenitis suppurativa: Secondary | ICD-10-CM | POA: Diagnosis not present

## 2022-01-11 ENCOUNTER — Encounter (HOSPITAL_COMMUNITY)
Admission: RE | Admit: 2022-01-11 | Discharge: 2022-01-11 | Disposition: A | Discharge status: Home / Self Care | Payer: Medicare Other | Source: Ambulatory Visit | Attending: Nephrology | Admitting: Nephrology

## 2022-01-11 VITALS — BP 148/74 | HR 67 | Temp 97.3°F | Resp 16

## 2022-01-11 DIAGNOSIS — D631 Anemia in chronic kidney disease: Secondary | ICD-10-CM | POA: Diagnosis not present

## 2022-01-11 DIAGNOSIS — N185 Chronic kidney disease, stage 5: Secondary | ICD-10-CM | POA: Insufficient documentation

## 2022-01-11 DIAGNOSIS — D649 Anemia, unspecified: Secondary | ICD-10-CM | POA: Diagnosis present

## 2022-01-11 MED ORDER — EPOETIN ALFA-EPBX 40000 UNIT/ML IJ SOLN: 20000.0000 [IU] | INTRAMUSCULAR | Status: DC

## 2022-01-11 MED ORDER — EPOETIN ALFA 20000 UNIT/ML IJ SOLN
INTRAMUSCULAR | Status: AC
  Administered 2022-01-11: 20000 [IU]
  Filled 2022-01-11: qty 1

## 2022-01-11 MED ORDER — EPOETIN ALFA 20000 UNIT/ML IJ SOLN
INTRAMUSCULAR | Status: AC
  Filled 2022-01-11: qty 1

## 2022-01-11 MED ORDER — SODIUM CHLORIDE 0.9 % IV SOLN
510.0000 mg | Freq: Once | INTRAVENOUS | Status: AC
  Administered 2022-01-11: 510 mg via INTRAVENOUS
  Filled 2022-01-11: qty 510

## 2022-01-14 LAB — POCT HEMOGLOBIN-HEMACUE: Hemoglobin: 9.3 g/dL — ABNORMAL LOW (ref 12.0–15.0)

## 2022-02-08 ENCOUNTER — Encounter (HOSPITAL_COMMUNITY)
Admission: RE | Admit: 2022-02-08 | Discharge: 2022-02-08 | Disposition: A | Discharge status: Home / Self Care | Payer: Medicare Other | Source: Ambulatory Visit | Attending: Nephrology | Admitting: Nephrology

## 2022-02-08 VITALS — BP 150/82 | HR 82 | Temp 99.1°F | Resp 18

## 2022-02-08 DIAGNOSIS — D631 Anemia in chronic kidney disease: Secondary | ICD-10-CM | POA: Insufficient documentation

## 2022-02-08 DIAGNOSIS — D649 Anemia, unspecified: Secondary | ICD-10-CM | POA: Insufficient documentation

## 2022-02-08 DIAGNOSIS — N189 Chronic kidney disease, unspecified: Secondary | ICD-10-CM | POA: Diagnosis not present

## 2022-02-08 LAB — RENAL FUNCTION PANEL
Albumin: 3.2 g/dL — ABNORMAL LOW (ref 3.5–5.0)
Anion gap: 6 (ref 5–15)
BUN: 49 mg/dL — ABNORMAL HIGH (ref 6–20)
CO2: 20 mmol/L — ABNORMAL LOW (ref 22–32)
Calcium: 8.6 mg/dL — ABNORMAL LOW (ref 8.9–10.3)
Chloride: 115 mmol/L — ABNORMAL HIGH (ref 98–111)
Creatinine, Ser: 4.55 mg/dL — ABNORMAL HIGH (ref 0.44–1.00)
GFR, Estimated: 11 mL/min — ABNORMAL LOW (ref >60)
Glucose, Bld: 76 mg/dL (ref 70–99)
Phosphorus: 4.7 mg/dL — ABNORMAL HIGH (ref 2.5–4.6)
Potassium: 4.8 mmol/L (ref 3.5–5.1)
Sodium: 141 mmol/L (ref 135–145)

## 2022-02-08 LAB — IRON AND TIBC
Iron: 98 ug/dL (ref 28–170)
Saturation Ratios: 39 % — ABNORMAL HIGH (ref 10.4–31.8)
TIBC: 255 ug/dL (ref 250–450)
UIBC: 157 ug/dL

## 2022-02-08 MED ORDER — EPOETIN ALFA-EPBX 10000 UNIT/ML IJ SOLN
INTRAMUSCULAR | Status: AC
  Filled 2022-02-08: qty 2

## 2022-02-08 MED ORDER — EPOETIN ALFA-EPBX 10000 UNIT/ML IJ SOLN
20000.0000 [IU] | INTRAMUSCULAR | Status: DC
  Administered 2022-02-08: 20000 [IU] via SUBCUTANEOUS

## 2022-02-20 DIAGNOSIS — Z23 Encounter for immunization: Secondary | ICD-10-CM | POA: Diagnosis not present

## 2022-02-20 DIAGNOSIS — K219 Gastro-esophageal reflux disease without esophagitis: Secondary | ICD-10-CM | POA: Diagnosis not present

## 2022-02-20 DIAGNOSIS — E039 Hypothyroidism, unspecified: Secondary | ICD-10-CM | POA: Diagnosis not present

## 2022-02-20 DIAGNOSIS — E7800 Pure hypercholesterolemia, unspecified: Secondary | ICD-10-CM | POA: Diagnosis not present

## 2022-02-20 DIAGNOSIS — D631 Anemia in chronic kidney disease: Secondary | ICD-10-CM | POA: Diagnosis not present

## 2022-02-20 DIAGNOSIS — R7303 Prediabetes: Secondary | ICD-10-CM | POA: Diagnosis not present

## 2022-02-20 DIAGNOSIS — Z0000 Encounter for general adult medical examination without abnormal findings: Secondary | ICD-10-CM | POA: Diagnosis not present

## 2022-02-20 DIAGNOSIS — N184 Chronic kidney disease, stage 4 (severe): Secondary | ICD-10-CM | POA: Diagnosis not present

## 2022-02-20 DIAGNOSIS — D638 Anemia in other chronic diseases classified elsewhere: Secondary | ICD-10-CM | POA: Diagnosis not present

## 2022-02-20 DIAGNOSIS — M1990 Unspecified osteoarthritis, unspecified site: Secondary | ICD-10-CM | POA: Diagnosis not present

## 2022-02-20 DIAGNOSIS — M069 Rheumatoid arthritis, unspecified: Secondary | ICD-10-CM | POA: Diagnosis not present

## 2022-02-21 LAB — POCT HEMOGLOBIN-HEMACUE: Hemoglobin: 9.5 g/dL — ABNORMAL LOW (ref 12.0–15.0)

## 2022-03-08 ENCOUNTER — Encounter (HOSPITAL_COMMUNITY)
Admission: RE | Admit: 2022-03-08 | Discharge: 2022-03-08 | Disposition: A | Discharge status: Home / Self Care | Payer: Medicare Other | Source: Ambulatory Visit | Attending: Nephrology | Admitting: Nephrology

## 2022-03-08 VITALS — BP 137/84 | HR 81

## 2022-03-08 DIAGNOSIS — N189 Chronic kidney disease, unspecified: Secondary | ICD-10-CM | POA: Diagnosis not present

## 2022-03-08 DIAGNOSIS — D649 Anemia, unspecified: Secondary | ICD-10-CM

## 2022-03-08 DIAGNOSIS — D631 Anemia in chronic kidney disease: Secondary | ICD-10-CM | POA: Diagnosis not present

## 2022-03-08 LAB — POCT HEMOGLOBIN-HEMACUE: Hemoglobin: 10.4 g/dL — ABNORMAL LOW (ref 12.0–15.0)

## 2022-03-08 MED ORDER — EPOETIN ALFA-EPBX 10000 UNIT/ML IJ SOLN
20000.0000 [IU] | INTRAMUSCULAR | Status: DC
  Administered 2022-03-08: 20000 [IU] via SUBCUTANEOUS

## 2022-03-08 MED ORDER — EPOETIN ALFA-EPBX 10000 UNIT/ML IJ SOLN
INTRAMUSCULAR | Status: AC
  Filled 2022-03-08: qty 2

## 2022-03-14 DIAGNOSIS — Z903 Acquired absence of stomach [part of]: Secondary | ICD-10-CM | POA: Diagnosis not present

## 2022-03-14 DIAGNOSIS — R058 Other specified cough: Secondary | ICD-10-CM | POA: Diagnosis not present

## 2022-03-14 DIAGNOSIS — T464X5A Adverse effect of angiotensin-converting-enzyme inhibitors, initial encounter: Secondary | ICD-10-CM | POA: Diagnosis not present

## 2022-03-14 DIAGNOSIS — R809 Proteinuria, unspecified: Secondary | ICD-10-CM | POA: Diagnosis not present

## 2022-03-14 DIAGNOSIS — N185 Chronic kidney disease, stage 5: Secondary | ICD-10-CM | POA: Diagnosis not present

## 2022-03-14 DIAGNOSIS — D631 Anemia in chronic kidney disease: Secondary | ICD-10-CM | POA: Diagnosis not present

## 2022-03-14 DIAGNOSIS — N2581 Secondary hyperparathyroidism of renal origin: Secondary | ICD-10-CM | POA: Diagnosis not present

## 2022-03-14 DIAGNOSIS — I120 Hypertensive chronic kidney disease with stage 5 chronic kidney disease or end stage renal disease: Secondary | ICD-10-CM | POA: Diagnosis not present

## 2022-03-14 DIAGNOSIS — M069 Rheumatoid arthritis, unspecified: Secondary | ICD-10-CM | POA: Diagnosis not present

## 2022-03-14 DIAGNOSIS — L732 Hidradenitis suppurativa: Secondary | ICD-10-CM | POA: Diagnosis not present

## 2022-03-14 DIAGNOSIS — N281 Cyst of kidney, acquired: Secondary | ICD-10-CM | POA: Diagnosis not present

## 2022-03-19 ENCOUNTER — Telehealth: Payer: Self-pay

## 2022-03-19 DIAGNOSIS — M0609 Rheumatoid arthritis without rheumatoid factor, multiple sites: Secondary | ICD-10-CM | POA: Diagnosis not present

## 2022-03-19 NOTE — Patient Outreach (Signed)
  Care Coordination   03/19/2022 Name: Elizabeth Griffin MRN: [id number] DOB: December 18, 1960   Care Coordination Outreach Attempts:  An unsuccessful telephone outreach was attempted today to offer the patient information about available care coordination services as a benefit of their health plan.   Follow Up Plan:  Additional outreach attempts will be made to offer the patient care coordination information and services.   Encounter Outcome:  No Answer  Care Coordination Interventions Activated:  No   Care Coordination Interventions:  No, not indicated      Enzo Montgomery, RN,BSN,CCM [place] Telephonic Care Management Coordinator Direct Phone: [phone] Toll Free: [phone] Fax: [phone]

## 2022-03-22 ENCOUNTER — Telehealth: Payer: Self-pay

## 2022-03-22 NOTE — Patient Outreach (Signed)
  Care Coordination   Initial Visit Note   03/22/2022 Name: Elizabeth Griffin MRN: [id number] DOB: [birth year]  Armen Pickup is a 61 y.o. year old female who sees Pahwani, Michell Heinrich, MD for primary care. I spoke with  Armen Pickup by phone today.  What matters to the patients health and wellness today?  Patient states she is doing well. Denies any acute issues or concerns. She voices she is having trouble accessing her [facility] portal account. Advised patient to contact office and she voiced understanding. She was appreciative of call but denies any RN CM needs or concerns at present.     Goals Addressed             This Visit's Progress    COMPLETED: Care Coordination-no follow up required       Care Coordination Interventions: Provided education to patient re: [facility] services Assessed social determinant of health barriers Updated immunization record          SDOH assessments and interventions completed:  Yes  SDOH Interventions Today    Flowsheet Row Most Recent Value  SDOH Interventions   Food Insecurity Interventions Intervention Not Indicated  Transportation Interventions Intervention Not Indicated        Care Coordination Interventions Activated:  Yes  Care Coordination Interventions:  Yes, provided   Follow up plan: No further intervention required.   Encounter Outcome:  Pt. Visit Completed    Enzo Montgomery, RN,BSN,CCM [place] [facility] Telephonic Care Management Coordinator Direct Phone: [phone] Toll Free: [phone] Fax: [phone]

## 2022-04-05 ENCOUNTER — Encounter (HOSPITAL_COMMUNITY)
Admission: RE | Admit: 2022-04-05 | Discharge: 2022-04-05 | Disposition: A | Discharge status: Home / Self Care | Payer: Medicare Other | Source: Ambulatory Visit | Attending: Nephrology | Admitting: Nephrology

## 2022-04-05 VITALS — BP 145/89 | HR 92

## 2022-04-05 DIAGNOSIS — D631 Anemia in chronic kidney disease: Secondary | ICD-10-CM | POA: Diagnosis not present

## 2022-04-05 DIAGNOSIS — N185 Chronic kidney disease, stage 5: Secondary | ICD-10-CM | POA: Insufficient documentation

## 2022-04-05 DIAGNOSIS — D649 Anemia, unspecified: Secondary | ICD-10-CM | POA: Diagnosis present

## 2022-04-05 LAB — RENAL FUNCTION PANEL
Albumin: 3.2 g/dL — ABNORMAL LOW (ref 3.5–5.0)
Anion gap: 7 (ref 5–15)
BUN: 58 mg/dL — ABNORMAL HIGH (ref 6–20)
CO2: 19 mmol/L — ABNORMAL LOW (ref 22–32)
Calcium: 8.7 mg/dL — ABNORMAL LOW (ref 8.9–10.3)
Chloride: 115 mmol/L — ABNORMAL HIGH (ref 98–111)
Creatinine, Ser: 5.31 mg/dL — ABNORMAL HIGH (ref 0.44–1.00)
GFR, Estimated: 9 mL/min — ABNORMAL LOW (ref >60)
Glucose, Bld: 87 mg/dL (ref 70–99)
Phosphorus: 4 mg/dL (ref 2.5–4.6)
Potassium: 4.8 mmol/L (ref 3.5–5.1)
Sodium: 141 mmol/L (ref 135–145)

## 2022-04-05 LAB — IRON AND TIBC
Iron: 94 ug/dL (ref 28–170)
Saturation Ratios: 40 % — ABNORMAL HIGH (ref 10.4–31.8)
TIBC: 234 ug/dL — ABNORMAL LOW (ref 250–450)
UIBC: 140 ug/dL

## 2022-04-05 LAB — FERRITIN: Ferritin: 361 ng/mL — ABNORMAL HIGH (ref 11–307)

## 2022-04-05 LAB — POCT HEMOGLOBIN-HEMACUE: Hemoglobin: 10.1 g/dL — ABNORMAL LOW (ref 12.0–15.0)

## 2022-04-05 MED ORDER — EPOETIN ALFA-EPBX 10000 UNIT/ML IJ SOLN
20000.0000 [IU] | INTRAMUSCULAR | Status: DC
  Administered 2022-04-05: 20000 [IU] via SUBCUTANEOUS

## 2022-04-05 MED ORDER — EPOETIN ALFA-EPBX 10000 UNIT/ML IJ SOLN
INTRAMUSCULAR | Status: AC
  Filled 2022-04-05: qty 2

## 2022-04-06 LAB — PTH, INTACT AND CALCIUM
Calcium, Total (PTH): 8.6 mg/dL — ABNORMAL LOW (ref 8.7–10.3)
PTH: 333 pg/mL — ABNORMAL HIGH (ref 15–65)

## 2022-04-09 DIAGNOSIS — J0100 Acute maxillary sinusitis, unspecified: Secondary | ICD-10-CM | POA: Diagnosis not present

## 2022-04-27 DIAGNOSIS — T8619 Other complication of kidney transplant: Secondary | ICD-10-CM | POA: Diagnosis not present

## 2022-04-27 DIAGNOSIS — N185 Chronic kidney disease, stage 5: Secondary | ICD-10-CM | POA: Diagnosis not present

## 2022-04-27 DIAGNOSIS — R7303 Prediabetes: Secondary | ICD-10-CM | POA: Diagnosis not present

## 2022-04-27 DIAGNOSIS — N269 Renal sclerosis, unspecified: Secondary | ICD-10-CM | POA: Diagnosis not present

## 2022-04-27 DIAGNOSIS — N261 Atrophy of kidney (terminal): Secondary | ICD-10-CM | POA: Diagnosis not present

## 2022-04-27 DIAGNOSIS — N2581 Secondary hyperparathyroidism of renal origin: Secondary | ICD-10-CM | POA: Diagnosis not present

## 2022-04-27 DIAGNOSIS — N186 End stage renal disease: Secondary | ICD-10-CM | POA: Diagnosis not present

## 2022-04-27 DIAGNOSIS — R12 Heartburn: Secondary | ICD-10-CM | POA: Diagnosis not present

## 2022-04-27 DIAGNOSIS — I120 Hypertensive chronic kidney disease with stage 5 chronic kidney disease or end stage renal disease: Secondary | ICD-10-CM | POA: Diagnosis not present

## 2022-04-27 DIAGNOSIS — Z7969 Long term (current) use of other immunomodulators and immunosuppressants: Secondary | ICD-10-CM | POA: Diagnosis not present

## 2022-04-27 DIAGNOSIS — E872 Acidosis, unspecified: Secondary | ICD-10-CM | POA: Diagnosis not present

## 2022-04-27 DIAGNOSIS — R7989 Other specified abnormal findings of blood chemistry: Secondary | ICD-10-CM | POA: Diagnosis not present

## 2022-04-27 DIAGNOSIS — D631 Anemia in chronic kidney disease: Secondary | ICD-10-CM | POA: Diagnosis not present

## 2022-04-27 DIAGNOSIS — K219 Gastro-esophageal reflux disease without esophagitis: Secondary | ICD-10-CM | POA: Diagnosis not present

## 2022-04-27 DIAGNOSIS — E878 Other disorders of electrolyte and fluid balance, not elsewhere classified: Secondary | ICD-10-CM | POA: Diagnosis not present

## 2022-04-27 DIAGNOSIS — T39395A Adverse effect of other nonsteroidal anti-inflammatory drugs [NSAID], initial encounter: Secondary | ICD-10-CM | POA: Diagnosis not present

## 2022-04-27 DIAGNOSIS — E079 Disorder of thyroid, unspecified: Secondary | ICD-10-CM | POA: Diagnosis not present

## 2022-04-27 DIAGNOSIS — Z4822 Encounter for aftercare following kidney transplant: Secondary | ICD-10-CM | POA: Diagnosis not present

## 2022-04-27 DIAGNOSIS — Z79621 Long term (current) use of calcineurin inhibitor: Secondary | ICD-10-CM | POA: Diagnosis not present

## 2022-04-27 DIAGNOSIS — E875 Hyperkalemia: Secondary | ICD-10-CM | POA: Diagnosis not present

## 2022-04-27 DIAGNOSIS — N179 Acute kidney failure, unspecified: Secondary | ICD-10-CM | POA: Diagnosis not present

## 2022-04-27 DIAGNOSIS — M069 Rheumatoid arthritis, unspecified: Secondary | ICD-10-CM | POA: Diagnosis not present

## 2022-04-27 DIAGNOSIS — D849 Immunodeficiency, unspecified: Secondary | ICD-10-CM | POA: Diagnosis not present

## 2022-04-27 DIAGNOSIS — D84821 Immunodeficiency due to drugs: Secondary | ICD-10-CM | POA: Diagnosis not present

## 2022-04-27 DIAGNOSIS — D72829 Elevated white blood cell count, unspecified: Secondary | ICD-10-CM | POA: Diagnosis not present

## 2022-04-27 DIAGNOSIS — D62 Acute posthemorrhagic anemia: Secondary | ICD-10-CM | POA: Diagnosis not present

## 2022-04-27 DIAGNOSIS — R9431 Abnormal electrocardiogram [ECG] [EKG]: Secondary | ICD-10-CM | POA: Diagnosis not present

## 2022-04-27 DIAGNOSIS — Z940 Kidney transplant status: Secondary | ICD-10-CM | POA: Diagnosis not present

## 2022-04-27 DIAGNOSIS — Z01818 Encounter for other preprocedural examination: Secondary | ICD-10-CM | POA: Diagnosis not present

## 2022-04-27 DIAGNOSIS — L732 Hidradenitis suppurativa: Secondary | ICD-10-CM | POA: Diagnosis not present

## 2022-04-27 DIAGNOSIS — Z5181 Encounter for therapeutic drug level monitoring: Secondary | ICD-10-CM | POA: Diagnosis not present

## 2022-04-27 DIAGNOSIS — T39395S Adverse effect of other nonsteroidal anti-inflammatory drugs [NSAID], sequela: Secondary | ICD-10-CM | POA: Diagnosis not present

## 2022-04-27 DIAGNOSIS — I701 Atherosclerosis of renal artery: Secondary | ICD-10-CM | POA: Diagnosis not present

## 2022-04-28 DIAGNOSIS — N185 Chronic kidney disease, stage 5: Secondary | ICD-10-CM | POA: Diagnosis not present

## 2022-04-28 DIAGNOSIS — E872 Acidosis, unspecified: Secondary | ICD-10-CM | POA: Diagnosis not present

## 2022-04-28 DIAGNOSIS — R7989 Other specified abnormal findings of blood chemistry: Secondary | ICD-10-CM | POA: Diagnosis not present

## 2022-04-28 DIAGNOSIS — R9431 Abnormal electrocardiogram [ECG] [EKG]: Secondary | ICD-10-CM | POA: Diagnosis not present

## 2022-04-28 DIAGNOSIS — N186 End stage renal disease: Secondary | ICD-10-CM | POA: Diagnosis not present

## 2022-04-28 DIAGNOSIS — I120 Hypertensive chronic kidney disease with stage 5 chronic kidney disease or end stage renal disease: Secondary | ICD-10-CM | POA: Diagnosis not present

## 2022-04-29 DIAGNOSIS — E878 Other disorders of electrolyte and fluid balance, not elsewhere classified: Secondary | ICD-10-CM | POA: Diagnosis not present

## 2022-04-29 DIAGNOSIS — Z940 Kidney transplant status: Secondary | ICD-10-CM | POA: Diagnosis not present

## 2022-04-29 DIAGNOSIS — E875 Hyperkalemia: Secondary | ICD-10-CM | POA: Diagnosis not present

## 2022-04-30 DIAGNOSIS — D84821 Immunodeficiency due to drugs: Secondary | ICD-10-CM | POA: Diagnosis not present

## 2022-04-30 DIAGNOSIS — Z940 Kidney transplant status: Secondary | ICD-10-CM | POA: Diagnosis not present

## 2022-04-30 DIAGNOSIS — Z79621 Long term (current) use of calcineurin inhibitor: Secondary | ICD-10-CM | POA: Diagnosis not present

## 2022-04-30 DIAGNOSIS — Z7969 Long term (current) use of other immunomodulators and immunosuppressants: Secondary | ICD-10-CM | POA: Diagnosis not present

## 2022-04-30 DIAGNOSIS — E875 Hyperkalemia: Secondary | ICD-10-CM | POA: Diagnosis not present

## 2022-05-03 ENCOUNTER — Encounter (HOSPITAL_COMMUNITY): Payer: Medicare Other

## 2022-05-03 DIAGNOSIS — I120 Hypertensive chronic kidney disease with stage 5 chronic kidney disease or end stage renal disease: Secondary | ICD-10-CM | POA: Diagnosis not present

## 2022-05-03 DIAGNOSIS — Z79899 Other long term (current) drug therapy: Secondary | ICD-10-CM | POA: Diagnosis not present

## 2022-05-03 DIAGNOSIS — D84821 Immunodeficiency due to drugs: Secondary | ICD-10-CM | POA: Diagnosis not present

## 2022-05-03 DIAGNOSIS — Z7952 Long term (current) use of systemic steroids: Secondary | ICD-10-CM | POA: Diagnosis not present

## 2022-05-03 DIAGNOSIS — Z79624 Long term (current) use of inhibitors of nucleotide synthesis: Secondary | ICD-10-CM | POA: Diagnosis not present

## 2022-05-03 DIAGNOSIS — Z4822 Encounter for aftercare following kidney transplant: Secondary | ICD-10-CM | POA: Diagnosis not present

## 2022-05-03 DIAGNOSIS — Z940 Kidney transplant status: Secondary | ICD-10-CM | POA: Diagnosis not present

## 2022-05-03 DIAGNOSIS — N185 Chronic kidney disease, stage 5: Secondary | ICD-10-CM | POA: Diagnosis not present

## 2022-05-03 DIAGNOSIS — Z5181 Encounter for therapeutic drug level monitoring: Secondary | ICD-10-CM | POA: Diagnosis not present

## 2022-05-03 DIAGNOSIS — Z79621 Long term (current) use of calcineurin inhibitor: Secondary | ICD-10-CM | POA: Diagnosis not present

## 2022-05-03 DIAGNOSIS — T8619 Other complication of kidney transplant: Secondary | ICD-10-CM | POA: Diagnosis not present

## 2022-05-06 DIAGNOSIS — R319 Hematuria, unspecified: Secondary | ICD-10-CM | POA: Diagnosis not present

## 2022-05-06 DIAGNOSIS — R110 Nausea: Secondary | ICD-10-CM | POA: Diagnosis not present

## 2022-05-06 DIAGNOSIS — Z79624 Long term (current) use of inhibitors of nucleotide synthesis: Secondary | ICD-10-CM | POA: Diagnosis not present

## 2022-05-06 DIAGNOSIS — Z7952 Long term (current) use of systemic steroids: Secondary | ICD-10-CM | POA: Diagnosis not present

## 2022-05-06 DIAGNOSIS — K219 Gastro-esophageal reflux disease without esophagitis: Secondary | ICD-10-CM | POA: Diagnosis not present

## 2022-05-06 DIAGNOSIS — D649 Anemia, unspecified: Secondary | ICD-10-CM | POA: Diagnosis not present

## 2022-05-06 DIAGNOSIS — Z940 Kidney transplant status: Secondary | ICD-10-CM | POA: Diagnosis not present

## 2022-05-06 DIAGNOSIS — Z792 Long term (current) use of antibiotics: Secondary | ICD-10-CM | POA: Diagnosis not present

## 2022-05-06 DIAGNOSIS — Z79621 Long term (current) use of calcineurin inhibitor: Secondary | ICD-10-CM | POA: Diagnosis not present

## 2022-05-06 DIAGNOSIS — Z5181 Encounter for therapeutic drug level monitoring: Secondary | ICD-10-CM | POA: Diagnosis not present

## 2022-05-06 DIAGNOSIS — D849 Immunodeficiency, unspecified: Secondary | ICD-10-CM | POA: Diagnosis not present

## 2022-05-06 DIAGNOSIS — I10 Essential (primary) hypertension: Secondary | ICD-10-CM | POA: Diagnosis not present

## 2022-05-06 DIAGNOSIS — M069 Rheumatoid arthritis, unspecified: Secondary | ICD-10-CM | POA: Diagnosis not present

## 2022-05-06 DIAGNOSIS — Z4822 Encounter for aftercare following kidney transplant: Secondary | ICD-10-CM | POA: Diagnosis not present

## 2022-05-06 DIAGNOSIS — Z79899 Other long term (current) drug therapy: Secondary | ICD-10-CM | POA: Diagnosis not present

## 2022-05-06 DIAGNOSIS — E785 Hyperlipidemia, unspecified: Secondary | ICD-10-CM | POA: Diagnosis not present

## 2022-05-08 DIAGNOSIS — Z792 Long term (current) use of antibiotics: Secondary | ICD-10-CM | POA: Diagnosis not present

## 2022-05-08 DIAGNOSIS — M069 Rheumatoid arthritis, unspecified: Secondary | ICD-10-CM | POA: Diagnosis not present

## 2022-05-08 DIAGNOSIS — Z79624 Long term (current) use of inhibitors of nucleotide synthesis: Secondary | ICD-10-CM | POA: Diagnosis not present

## 2022-05-08 DIAGNOSIS — E785 Hyperlipidemia, unspecified: Secondary | ICD-10-CM | POA: Diagnosis not present

## 2022-05-08 DIAGNOSIS — D631 Anemia in chronic kidney disease: Secondary | ICD-10-CM | POA: Diagnosis not present

## 2022-05-08 DIAGNOSIS — Z4822 Encounter for aftercare following kidney transplant: Secondary | ICD-10-CM | POA: Diagnosis not present

## 2022-05-08 DIAGNOSIS — K317 Polyp of stomach and duodenum: Secondary | ICD-10-CM | POA: Diagnosis not present

## 2022-05-08 DIAGNOSIS — Z79621 Long term (current) use of calcineurin inhibitor: Secondary | ICD-10-CM | POA: Diagnosis not present

## 2022-05-08 DIAGNOSIS — Z7982 Long term (current) use of aspirin: Secondary | ICD-10-CM | POA: Diagnosis not present

## 2022-05-08 DIAGNOSIS — T8619 Other complication of kidney transplant: Secondary | ICD-10-CM | POA: Diagnosis not present

## 2022-05-08 DIAGNOSIS — D849 Immunodeficiency, unspecified: Secondary | ICD-10-CM | POA: Diagnosis not present

## 2022-05-08 DIAGNOSIS — K219 Gastro-esophageal reflux disease without esophagitis: Secondary | ICD-10-CM | POA: Diagnosis not present

## 2022-05-08 DIAGNOSIS — I120 Hypertensive chronic kidney disease with stage 5 chronic kidney disease or end stage renal disease: Secondary | ICD-10-CM | POA: Diagnosis not present

## 2022-05-08 DIAGNOSIS — Z7952 Long term (current) use of systemic steroids: Secondary | ICD-10-CM | POA: Diagnosis not present

## 2022-05-08 DIAGNOSIS — R638 Other symptoms and signs concerning food and fluid intake: Secondary | ICD-10-CM | POA: Diagnosis not present

## 2022-05-08 DIAGNOSIS — Z940 Kidney transplant status: Secondary | ICD-10-CM | POA: Diagnosis not present

## 2022-05-08 DIAGNOSIS — G4700 Insomnia, unspecified: Secondary | ICD-10-CM | POA: Diagnosis not present

## 2022-05-08 DIAGNOSIS — K449 Diaphragmatic hernia without obstruction or gangrene: Secondary | ICD-10-CM | POA: Diagnosis not present

## 2022-05-08 DIAGNOSIS — Z79899 Other long term (current) drug therapy: Secondary | ICD-10-CM | POA: Diagnosis not present

## 2022-05-08 DIAGNOSIS — Z5181 Encounter for therapeutic drug level monitoring: Secondary | ICD-10-CM | POA: Diagnosis not present

## 2022-05-08 DIAGNOSIS — N185 Chronic kidney disease, stage 5: Secondary | ICD-10-CM | POA: Diagnosis not present

## 2022-05-09 DIAGNOSIS — R1310 Dysphagia, unspecified: Secondary | ICD-10-CM | POA: Diagnosis not present

## 2022-05-09 DIAGNOSIS — K219 Gastro-esophageal reflux disease without esophagitis: Secondary | ICD-10-CM | POA: Diagnosis not present

## 2022-05-09 DIAGNOSIS — Z9049 Acquired absence of other specified parts of digestive tract: Secondary | ICD-10-CM | POA: Diagnosis not present

## 2022-05-09 DIAGNOSIS — Z9884 Bariatric surgery status: Secondary | ICD-10-CM | POA: Diagnosis not present

## 2022-05-10 DIAGNOSIS — D649 Anemia, unspecified: Secondary | ICD-10-CM | POA: Diagnosis not present

## 2022-05-10 DIAGNOSIS — Z7983 Long term (current) use of bisphosphonates: Secondary | ICD-10-CM | POA: Diagnosis not present

## 2022-05-10 DIAGNOSIS — M069 Rheumatoid arthritis, unspecified: Secondary | ICD-10-CM | POA: Diagnosis not present

## 2022-05-10 DIAGNOSIS — D849 Immunodeficiency, unspecified: Secondary | ICD-10-CM | POA: Diagnosis not present

## 2022-05-10 DIAGNOSIS — Z5181 Encounter for therapeutic drug level monitoring: Secondary | ICD-10-CM | POA: Diagnosis not present

## 2022-05-10 DIAGNOSIS — Z940 Kidney transplant status: Secondary | ICD-10-CM | POA: Diagnosis not present

## 2022-05-10 DIAGNOSIS — I10 Essential (primary) hypertension: Secondary | ICD-10-CM | POA: Diagnosis not present

## 2022-05-10 DIAGNOSIS — Z79624 Long term (current) use of inhibitors of nucleotide synthesis: Secondary | ICD-10-CM | POA: Diagnosis not present

## 2022-05-10 DIAGNOSIS — Z4822 Encounter for aftercare following kidney transplant: Secondary | ICD-10-CM | POA: Diagnosis not present

## 2022-05-10 DIAGNOSIS — Z79621 Long term (current) use of calcineurin inhibitor: Secondary | ICD-10-CM | POA: Diagnosis not present

## 2022-05-10 DIAGNOSIS — Z7952 Long term (current) use of systemic steroids: Secondary | ICD-10-CM | POA: Diagnosis not present

## 2022-05-10 DIAGNOSIS — K219 Gastro-esophageal reflux disease without esophagitis: Secondary | ICD-10-CM | POA: Diagnosis not present

## 2022-05-13 DIAGNOSIS — I120 Hypertensive chronic kidney disease with stage 5 chronic kidney disease or end stage renal disease: Secondary | ICD-10-CM | POA: Diagnosis not present

## 2022-05-13 DIAGNOSIS — Z79899 Other long term (current) drug therapy: Secondary | ICD-10-CM | POA: Diagnosis not present

## 2022-05-13 DIAGNOSIS — M069 Rheumatoid arthritis, unspecified: Secondary | ICD-10-CM | POA: Diagnosis not present

## 2022-05-13 DIAGNOSIS — Z5181 Encounter for therapeutic drug level monitoring: Secondary | ICD-10-CM | POA: Diagnosis not present

## 2022-05-13 DIAGNOSIS — Z79624 Long term (current) use of inhibitors of nucleotide synthesis: Secondary | ICD-10-CM | POA: Diagnosis not present

## 2022-05-13 DIAGNOSIS — N289 Disorder of kidney and ureter, unspecified: Secondary | ICD-10-CM | POA: Diagnosis not present

## 2022-05-13 DIAGNOSIS — K219 Gastro-esophageal reflux disease without esophagitis: Secondary | ICD-10-CM | POA: Diagnosis not present

## 2022-05-13 DIAGNOSIS — Z940 Kidney transplant status: Secondary | ICD-10-CM | POA: Diagnosis not present

## 2022-05-13 DIAGNOSIS — Z79621 Long term (current) use of calcineurin inhibitor: Secondary | ICD-10-CM | POA: Diagnosis not present

## 2022-05-13 DIAGNOSIS — Z7952 Long term (current) use of systemic steroids: Secondary | ICD-10-CM | POA: Diagnosis not present

## 2022-05-13 DIAGNOSIS — Z4822 Encounter for aftercare following kidney transplant: Secondary | ICD-10-CM | POA: Diagnosis not present

## 2022-05-13 DIAGNOSIS — D649 Anemia, unspecified: Secondary | ICD-10-CM | POA: Diagnosis not present

## 2022-05-13 DIAGNOSIS — E785 Hyperlipidemia, unspecified: Secondary | ICD-10-CM | POA: Diagnosis not present

## 2022-05-13 DIAGNOSIS — D849 Immunodeficiency, unspecified: Secondary | ICD-10-CM | POA: Diagnosis not present

## 2022-05-13 DIAGNOSIS — N185 Chronic kidney disease, stage 5: Secondary | ICD-10-CM | POA: Diagnosis not present

## 2022-05-14 DIAGNOSIS — N261 Atrophy of kidney (terminal): Secondary | ICD-10-CM | POA: Diagnosis not present

## 2022-05-14 DIAGNOSIS — Z4822 Encounter for aftercare following kidney transplant: Secondary | ICD-10-CM | POA: Diagnosis not present

## 2022-05-14 DIAGNOSIS — Z940 Kidney transplant status: Secondary | ICD-10-CM | POA: Diagnosis not present

## 2022-05-14 DIAGNOSIS — N269 Renal sclerosis, unspecified: Secondary | ICD-10-CM | POA: Diagnosis not present

## 2022-05-15 DIAGNOSIS — K449 Diaphragmatic hernia without obstruction or gangrene: Secondary | ICD-10-CM | POA: Diagnosis not present

## 2022-05-15 DIAGNOSIS — Z792 Long term (current) use of antibiotics: Secondary | ICD-10-CM | POA: Diagnosis not present

## 2022-05-15 DIAGNOSIS — Z79624 Long term (current) use of inhibitors of nucleotide synthesis: Secondary | ICD-10-CM | POA: Diagnosis not present

## 2022-05-15 DIAGNOSIS — Z5181 Encounter for therapeutic drug level monitoring: Secondary | ICD-10-CM | POA: Diagnosis not present

## 2022-05-15 DIAGNOSIS — I10 Essential (primary) hypertension: Secondary | ICD-10-CM | POA: Diagnosis not present

## 2022-05-15 DIAGNOSIS — Z4822 Encounter for aftercare following kidney transplant: Secondary | ICD-10-CM | POA: Diagnosis not present

## 2022-05-15 DIAGNOSIS — E785 Hyperlipidemia, unspecified: Secondary | ICD-10-CM | POA: Diagnosis not present

## 2022-05-15 DIAGNOSIS — Z940 Kidney transplant status: Secondary | ICD-10-CM | POA: Diagnosis not present

## 2022-05-15 DIAGNOSIS — Z9884 Bariatric surgery status: Secondary | ICD-10-CM | POA: Diagnosis not present

## 2022-05-15 DIAGNOSIS — D649 Anemia, unspecified: Secondary | ICD-10-CM | POA: Diagnosis not present

## 2022-05-15 DIAGNOSIS — D849 Immunodeficiency, unspecified: Secondary | ICD-10-CM | POA: Diagnosis not present

## 2022-05-15 DIAGNOSIS — M069 Rheumatoid arthritis, unspecified: Secondary | ICD-10-CM | POA: Diagnosis not present

## 2022-05-15 DIAGNOSIS — Z79899 Other long term (current) drug therapy: Secondary | ICD-10-CM | POA: Diagnosis not present

## 2022-05-15 DIAGNOSIS — Z7952 Long term (current) use of systemic steroids: Secondary | ICD-10-CM | POA: Diagnosis not present

## 2022-05-15 DIAGNOSIS — K219 Gastro-esophageal reflux disease without esophagitis: Secondary | ICD-10-CM | POA: Diagnosis not present

## 2022-05-15 DIAGNOSIS — Z79621 Long term (current) use of calcineurin inhibitor: Secondary | ICD-10-CM | POA: Diagnosis not present

## 2022-05-17 DIAGNOSIS — Z7969 Long term (current) use of other immunomodulators and immunosuppressants: Secondary | ICD-10-CM | POA: Diagnosis not present

## 2022-05-17 DIAGNOSIS — K219 Gastro-esophageal reflux disease without esophagitis: Secondary | ICD-10-CM | POA: Diagnosis not present

## 2022-05-17 DIAGNOSIS — Z4822 Encounter for aftercare following kidney transplant: Secondary | ICD-10-CM | POA: Diagnosis not present

## 2022-05-17 DIAGNOSIS — Z940 Kidney transplant status: Secondary | ICD-10-CM | POA: Diagnosis not present

## 2022-05-17 DIAGNOSIS — E785 Hyperlipidemia, unspecified: Secondary | ICD-10-CM | POA: Diagnosis not present

## 2022-05-17 DIAGNOSIS — I10 Essential (primary) hypertension: Secondary | ICD-10-CM | POA: Diagnosis not present

## 2022-05-17 DIAGNOSIS — D84821 Immunodeficiency due to drugs: Secondary | ICD-10-CM | POA: Diagnosis not present

## 2022-05-17 DIAGNOSIS — E875 Hyperkalemia: Secondary | ICD-10-CM | POA: Diagnosis not present

## 2022-05-17 DIAGNOSIS — Z5181 Encounter for therapeutic drug level monitoring: Secondary | ICD-10-CM | POA: Diagnosis not present

## 2022-05-17 DIAGNOSIS — D649 Anemia, unspecified: Secondary | ICD-10-CM | POA: Diagnosis not present

## 2022-05-17 DIAGNOSIS — Z79621 Long term (current) use of calcineurin inhibitor: Secondary | ICD-10-CM | POA: Diagnosis not present

## 2022-05-17 DIAGNOSIS — Z7952 Long term (current) use of systemic steroids: Secondary | ICD-10-CM | POA: Diagnosis not present

## 2022-05-21 DIAGNOSIS — D84821 Immunodeficiency due to drugs: Secondary | ICD-10-CM | POA: Diagnosis not present

## 2022-05-21 DIAGNOSIS — Z882 Allergy status to sulfonamides status: Secondary | ICD-10-CM | POA: Diagnosis not present

## 2022-05-21 DIAGNOSIS — Z7952 Long term (current) use of systemic steroids: Secondary | ICD-10-CM | POA: Diagnosis not present

## 2022-05-21 DIAGNOSIS — M069 Rheumatoid arthritis, unspecified: Secondary | ICD-10-CM | POA: Diagnosis not present

## 2022-05-21 DIAGNOSIS — D8989 Other specified disorders involving the immune mechanism, not elsewhere classified: Secondary | ICD-10-CM | POA: Diagnosis not present

## 2022-05-21 DIAGNOSIS — D631 Anemia in chronic kidney disease: Secondary | ICD-10-CM | POA: Diagnosis not present

## 2022-05-21 DIAGNOSIS — D649 Anemia, unspecified: Secondary | ICD-10-CM | POA: Diagnosis not present

## 2022-05-21 DIAGNOSIS — K449 Diaphragmatic hernia without obstruction or gangrene: Secondary | ICD-10-CM | POA: Diagnosis not present

## 2022-05-21 DIAGNOSIS — I120 Hypertensive chronic kidney disease with stage 5 chronic kidney disease or end stage renal disease: Secondary | ICD-10-CM | POA: Diagnosis not present

## 2022-05-21 DIAGNOSIS — K219 Gastro-esophageal reflux disease without esophagitis: Secondary | ICD-10-CM | POA: Diagnosis not present

## 2022-05-21 DIAGNOSIS — E785 Hyperlipidemia, unspecified: Secondary | ICD-10-CM | POA: Diagnosis not present

## 2022-05-21 DIAGNOSIS — R110 Nausea: Secondary | ICD-10-CM | POA: Diagnosis not present

## 2022-05-21 DIAGNOSIS — I10 Essential (primary) hypertension: Secondary | ICD-10-CM | POA: Diagnosis not present

## 2022-05-21 DIAGNOSIS — Z940 Kidney transplant status: Secondary | ICD-10-CM | POA: Diagnosis not present

## 2022-05-21 DIAGNOSIS — Z4822 Encounter for aftercare following kidney transplant: Secondary | ICD-10-CM | POA: Diagnosis not present

## 2022-05-21 DIAGNOSIS — N185 Chronic kidney disease, stage 5: Secondary | ICD-10-CM | POA: Diagnosis not present

## 2022-05-21 DIAGNOSIS — K317 Polyp of stomach and duodenum: Secondary | ICD-10-CM | POA: Diagnosis not present

## 2022-05-21 DIAGNOSIS — Z79899 Other long term (current) drug therapy: Secondary | ICD-10-CM | POA: Diagnosis not present

## 2022-05-23 DIAGNOSIS — Z940 Kidney transplant status: Secondary | ICD-10-CM | POA: Diagnosis not present

## 2022-05-23 DIAGNOSIS — Z960 Presence of urogenital implants: Secondary | ICD-10-CM | POA: Diagnosis not present

## 2022-05-24 DIAGNOSIS — K219 Gastro-esophageal reflux disease without esophagitis: Secondary | ICD-10-CM | POA: Diagnosis not present

## 2022-05-24 DIAGNOSIS — D649 Anemia, unspecified: Secondary | ICD-10-CM | POA: Diagnosis not present

## 2022-05-24 DIAGNOSIS — Z4822 Encounter for aftercare following kidney transplant: Secondary | ICD-10-CM | POA: Diagnosis not present

## 2022-05-24 DIAGNOSIS — Z940 Kidney transplant status: Secondary | ICD-10-CM | POA: Diagnosis not present

## 2022-05-24 DIAGNOSIS — Z79621 Long term (current) use of calcineurin inhibitor: Secondary | ICD-10-CM | POA: Diagnosis not present

## 2022-05-24 DIAGNOSIS — Z5181 Encounter for therapeutic drug level monitoring: Secondary | ICD-10-CM | POA: Diagnosis not present

## 2022-05-24 DIAGNOSIS — Z7952 Long term (current) use of systemic steroids: Secondary | ICD-10-CM | POA: Diagnosis not present

## 2022-05-24 DIAGNOSIS — Z79624 Long term (current) use of inhibitors of nucleotide synthesis: Secondary | ICD-10-CM | POA: Diagnosis not present

## 2022-05-24 DIAGNOSIS — K449 Diaphragmatic hernia without obstruction or gangrene: Secondary | ICD-10-CM | POA: Diagnosis not present

## 2022-05-24 DIAGNOSIS — Z792 Long term (current) use of antibiotics: Secondary | ICD-10-CM | POA: Diagnosis not present

## 2022-05-24 DIAGNOSIS — I10 Essential (primary) hypertension: Secondary | ICD-10-CM | POA: Diagnosis not present

## 2022-05-28 DIAGNOSIS — D649 Anemia, unspecified: Secondary | ICD-10-CM | POA: Diagnosis not present

## 2022-05-28 DIAGNOSIS — Z7952 Long term (current) use of systemic steroids: Secondary | ICD-10-CM | POA: Diagnosis not present

## 2022-05-28 DIAGNOSIS — J069 Acute upper respiratory infection, unspecified: Secondary | ICD-10-CM | POA: Diagnosis not present

## 2022-05-28 DIAGNOSIS — N281 Cyst of kidney, acquired: Secondary | ICD-10-CM | POA: Diagnosis not present

## 2022-05-28 DIAGNOSIS — E785 Hyperlipidemia, unspecified: Secondary | ICD-10-CM | POA: Diagnosis not present

## 2022-05-28 DIAGNOSIS — Z792 Long term (current) use of antibiotics: Secondary | ICD-10-CM | POA: Diagnosis not present

## 2022-05-28 DIAGNOSIS — K449 Diaphragmatic hernia without obstruction or gangrene: Secondary | ICD-10-CM | POA: Diagnosis not present

## 2022-05-28 DIAGNOSIS — K219 Gastro-esophageal reflux disease without esophagitis: Secondary | ICD-10-CM | POA: Diagnosis not present

## 2022-05-28 DIAGNOSIS — Z5181 Encounter for therapeutic drug level monitoring: Secondary | ICD-10-CM | POA: Diagnosis not present

## 2022-05-28 DIAGNOSIS — E119 Type 2 diabetes mellitus without complications: Secondary | ICD-10-CM | POA: Diagnosis not present

## 2022-05-28 DIAGNOSIS — D849 Immunodeficiency, unspecified: Secondary | ICD-10-CM | POA: Diagnosis not present

## 2022-05-28 DIAGNOSIS — Z4822 Encounter for aftercare following kidney transplant: Secondary | ICD-10-CM | POA: Diagnosis not present

## 2022-05-28 DIAGNOSIS — Z940 Kidney transplant status: Secondary | ICD-10-CM | POA: Diagnosis not present

## 2022-05-28 DIAGNOSIS — Z79621 Long term (current) use of calcineurin inhibitor: Secondary | ICD-10-CM | POA: Diagnosis not present

## 2022-05-31 ENCOUNTER — Encounter (HOSPITAL_COMMUNITY): Payer: Medicare Other

## 2022-06-04 DIAGNOSIS — N185 Chronic kidney disease, stage 5: Secondary | ICD-10-CM | POA: Diagnosis not present

## 2022-06-04 DIAGNOSIS — I10 Essential (primary) hypertension: Secondary | ICD-10-CM | POA: Diagnosis not present

## 2022-06-04 DIAGNOSIS — K449 Diaphragmatic hernia without obstruction or gangrene: Secondary | ICD-10-CM | POA: Diagnosis not present

## 2022-06-04 DIAGNOSIS — Z79899 Other long term (current) drug therapy: Secondary | ICD-10-CM | POA: Diagnosis not present

## 2022-06-04 DIAGNOSIS — I120 Hypertensive chronic kidney disease with stage 5 chronic kidney disease or end stage renal disease: Secondary | ICD-10-CM | POA: Diagnosis not present

## 2022-06-04 DIAGNOSIS — Z7952 Long term (current) use of systemic steroids: Secondary | ICD-10-CM | POA: Diagnosis not present

## 2022-06-04 DIAGNOSIS — Z4822 Encounter for aftercare following kidney transplant: Secondary | ICD-10-CM | POA: Diagnosis not present

## 2022-06-04 DIAGNOSIS — E785 Hyperlipidemia, unspecified: Secondary | ICD-10-CM | POA: Diagnosis not present

## 2022-06-04 DIAGNOSIS — Z940 Kidney transplant status: Secondary | ICD-10-CM | POA: Diagnosis not present

## 2022-06-04 DIAGNOSIS — Z79624 Long term (current) use of inhibitors of nucleotide synthesis: Secondary | ICD-10-CM | POA: Diagnosis not present

## 2022-06-04 DIAGNOSIS — D849 Immunodeficiency, unspecified: Secondary | ICD-10-CM | POA: Diagnosis not present

## 2022-06-04 DIAGNOSIS — K317 Polyp of stomach and duodenum: Secondary | ICD-10-CM | POA: Diagnosis not present

## 2022-06-04 DIAGNOSIS — D631 Anemia in chronic kidney disease: Secondary | ICD-10-CM | POA: Diagnosis not present

## 2022-06-04 DIAGNOSIS — K219 Gastro-esophageal reflux disease without esophagitis: Secondary | ICD-10-CM | POA: Diagnosis not present

## 2022-06-04 DIAGNOSIS — Z79621 Long term (current) use of calcineurin inhibitor: Secondary | ICD-10-CM | POA: Diagnosis not present

## 2022-06-11 DIAGNOSIS — R030 Elevated blood-pressure reading, without diagnosis of hypertension: Secondary | ICD-10-CM | POA: Diagnosis not present

## 2022-06-11 DIAGNOSIS — Z940 Kidney transplant status: Secondary | ICD-10-CM | POA: Diagnosis not present

## 2022-06-11 DIAGNOSIS — Z4822 Encounter for aftercare following kidney transplant: Secondary | ICD-10-CM | POA: Diagnosis not present

## 2022-06-11 DIAGNOSIS — I10 Essential (primary) hypertension: Secondary | ICD-10-CM | POA: Diagnosis not present

## 2022-06-11 DIAGNOSIS — Z79899 Other long term (current) drug therapy: Secondary | ICD-10-CM | POA: Diagnosis not present

## 2022-06-11 DIAGNOSIS — K219 Gastro-esophageal reflux disease without esophagitis: Secondary | ICD-10-CM | POA: Diagnosis not present

## 2022-06-11 DIAGNOSIS — Z79621 Long term (current) use of calcineurin inhibitor: Secondary | ICD-10-CM | POA: Diagnosis not present

## 2022-06-11 DIAGNOSIS — D849 Immunodeficiency, unspecified: Secondary | ICD-10-CM | POA: Diagnosis not present

## 2022-06-11 DIAGNOSIS — Z5181 Encounter for therapeutic drug level monitoring: Secondary | ICD-10-CM | POA: Diagnosis not present

## 2022-06-13 DIAGNOSIS — Z940 Kidney transplant status: Secondary | ICD-10-CM | POA: Diagnosis not present

## 2022-06-13 DIAGNOSIS — K208 Other esophagitis without bleeding: Secondary | ICD-10-CM | POA: Diagnosis not present

## 2022-06-13 DIAGNOSIS — K2289 Other specified disease of esophagus: Secondary | ICD-10-CM | POA: Diagnosis not present

## 2022-06-13 DIAGNOSIS — K219 Gastro-esophageal reflux disease without esophagitis: Secondary | ICD-10-CM | POA: Diagnosis not present

## 2022-06-13 DIAGNOSIS — K222 Esophageal obstruction: Secondary | ICD-10-CM | POA: Diagnosis not present

## 2022-06-13 DIAGNOSIS — Z9884 Bariatric surgery status: Secondary | ICD-10-CM | POA: Diagnosis not present

## 2022-06-13 DIAGNOSIS — K229 Disease of esophagus, unspecified: Secondary | ICD-10-CM | POA: Diagnosis not present

## 2022-06-13 DIAGNOSIS — K209 Esophagitis, unspecified without bleeding: Secondary | ICD-10-CM | POA: Diagnosis not present

## 2022-06-13 DIAGNOSIS — K449 Diaphragmatic hernia without obstruction or gangrene: Secondary | ICD-10-CM | POA: Diagnosis not present

## 2022-06-13 DIAGNOSIS — Z903 Acquired absence of stomach [part of]: Secondary | ICD-10-CM | POA: Diagnosis not present

## 2022-06-13 DIAGNOSIS — R1310 Dysphagia, unspecified: Secondary | ICD-10-CM | POA: Diagnosis not present

## 2022-06-18 DIAGNOSIS — Z940 Kidney transplant status: Secondary | ICD-10-CM | POA: Diagnosis not present

## 2022-06-18 DIAGNOSIS — Z79899 Other long term (current) drug therapy: Secondary | ICD-10-CM | POA: Diagnosis not present

## 2022-06-18 DIAGNOSIS — R519 Headache, unspecified: Secondary | ICD-10-CM | POA: Diagnosis not present

## 2022-06-18 DIAGNOSIS — K219 Gastro-esophageal reflux disease without esophagitis: Secondary | ICD-10-CM | POA: Diagnosis not present

## 2022-06-18 DIAGNOSIS — M069 Rheumatoid arthritis, unspecified: Secondary | ICD-10-CM | POA: Diagnosis not present

## 2022-06-18 DIAGNOSIS — Z4822 Encounter for aftercare following kidney transplant: Secondary | ICD-10-CM | POA: Diagnosis not present

## 2022-06-18 DIAGNOSIS — N281 Cyst of kidney, acquired: Secondary | ICD-10-CM | POA: Diagnosis not present

## 2022-06-18 DIAGNOSIS — Z79624 Long term (current) use of inhibitors of nucleotide synthesis: Secondary | ICD-10-CM | POA: Diagnosis not present

## 2022-06-18 DIAGNOSIS — K449 Diaphragmatic hernia without obstruction or gangrene: Secondary | ICD-10-CM | POA: Diagnosis not present

## 2022-06-18 DIAGNOSIS — Z7952 Long term (current) use of systemic steroids: Secondary | ICD-10-CM | POA: Diagnosis not present

## 2022-06-18 DIAGNOSIS — K317 Polyp of stomach and duodenum: Secondary | ICD-10-CM | POA: Diagnosis not present

## 2022-06-18 DIAGNOSIS — I10 Essential (primary) hypertension: Secondary | ICD-10-CM | POA: Diagnosis not present

## 2022-06-18 DIAGNOSIS — Z79621 Long term (current) use of calcineurin inhibitor: Secondary | ICD-10-CM | POA: Diagnosis not present

## 2022-06-18 DIAGNOSIS — E785 Hyperlipidemia, unspecified: Secondary | ICD-10-CM | POA: Diagnosis not present

## 2022-06-20 DIAGNOSIS — Z4822 Encounter for aftercare following kidney transplant: Secondary | ICD-10-CM | POA: Diagnosis not present

## 2022-06-20 DIAGNOSIS — Z940 Kidney transplant status: Secondary | ICD-10-CM | POA: Diagnosis not present

## 2022-06-20 DIAGNOSIS — Z7969 Long term (current) use of other immunomodulators and immunosuppressants: Secondary | ICD-10-CM | POA: Diagnosis not present

## 2022-06-20 DIAGNOSIS — Z9884 Bariatric surgery status: Secondary | ICD-10-CM | POA: Diagnosis not present

## 2022-06-20 DIAGNOSIS — I10 Essential (primary) hypertension: Secondary | ICD-10-CM | POA: Diagnosis not present

## 2022-06-20 DIAGNOSIS — Z5181 Encounter for therapeutic drug level monitoring: Secondary | ICD-10-CM | POA: Diagnosis not present

## 2022-06-20 DIAGNOSIS — D649 Anemia, unspecified: Secondary | ICD-10-CM | POA: Diagnosis not present

## 2022-06-20 DIAGNOSIS — Z79899 Other long term (current) drug therapy: Secondary | ICD-10-CM | POA: Diagnosis not present

## 2022-06-20 DIAGNOSIS — M069 Rheumatoid arthritis, unspecified: Secondary | ICD-10-CM | POA: Diagnosis not present

## 2022-06-25 DIAGNOSIS — Z5181 Encounter for therapeutic drug level monitoring: Secondary | ICD-10-CM | POA: Diagnosis not present

## 2022-06-25 DIAGNOSIS — D649 Anemia, unspecified: Secondary | ICD-10-CM | POA: Diagnosis not present

## 2022-06-25 DIAGNOSIS — K222 Esophageal obstruction: Secondary | ICD-10-CM | POA: Diagnosis not present

## 2022-06-25 DIAGNOSIS — I129 Hypertensive chronic kidney disease with stage 1 through stage 4 chronic kidney disease, or unspecified chronic kidney disease: Secondary | ICD-10-CM | POA: Diagnosis not present

## 2022-06-25 DIAGNOSIS — T8610 Unspecified complication of kidney transplant: Secondary | ICD-10-CM | POA: Diagnosis not present

## 2022-06-25 DIAGNOSIS — Z79621 Long term (current) use of calcineurin inhibitor: Secondary | ICD-10-CM | POA: Diagnosis not present

## 2022-06-25 DIAGNOSIS — K219 Gastro-esophageal reflux disease without esophagitis: Secondary | ICD-10-CM | POA: Diagnosis not present

## 2022-06-25 DIAGNOSIS — R197 Diarrhea, unspecified: Secondary | ICD-10-CM | POA: Diagnosis not present

## 2022-06-25 DIAGNOSIS — N281 Cyst of kidney, acquired: Secondary | ICD-10-CM | POA: Diagnosis not present

## 2022-06-25 DIAGNOSIS — K317 Polyp of stomach and duodenum: Secondary | ICD-10-CM | POA: Diagnosis not present

## 2022-06-25 DIAGNOSIS — K449 Diaphragmatic hernia without obstruction or gangrene: Secondary | ICD-10-CM | POA: Diagnosis not present

## 2022-06-25 DIAGNOSIS — Z4822 Encounter for aftercare following kidney transplant: Secondary | ICD-10-CM | POA: Diagnosis not present

## 2022-06-25 DIAGNOSIS — D849 Immunodeficiency, unspecified: Secondary | ICD-10-CM | POA: Diagnosis not present

## 2022-06-25 DIAGNOSIS — E872 Acidosis, unspecified: Secondary | ICD-10-CM | POA: Diagnosis not present

## 2022-06-25 DIAGNOSIS — N184 Chronic kidney disease, stage 4 (severe): Secondary | ICD-10-CM | POA: Diagnosis not present

## 2022-06-25 DIAGNOSIS — D631 Anemia in chronic kidney disease: Secondary | ICD-10-CM | POA: Diagnosis not present

## 2022-06-25 DIAGNOSIS — Z7952 Long term (current) use of systemic steroids: Secondary | ICD-10-CM | POA: Diagnosis not present

## 2022-06-25 DIAGNOSIS — M069 Rheumatoid arthritis, unspecified: Secondary | ICD-10-CM | POA: Diagnosis not present

## 2022-06-25 DIAGNOSIS — E785 Hyperlipidemia, unspecified: Secondary | ICD-10-CM | POA: Diagnosis not present

## 2022-06-25 DIAGNOSIS — Z940 Kidney transplant status: Secondary | ICD-10-CM | POA: Diagnosis not present

## 2022-06-25 DIAGNOSIS — Z79899 Other long term (current) drug therapy: Secondary | ICD-10-CM | POA: Diagnosis not present

## 2022-06-25 DIAGNOSIS — R768 Other specified abnormal immunological findings in serum: Secondary | ICD-10-CM | POA: Diagnosis not present

## 2022-06-25 DIAGNOSIS — I10 Essential (primary) hypertension: Secondary | ICD-10-CM | POA: Diagnosis not present

## 2022-06-25 DIAGNOSIS — Z79624 Long term (current) use of inhibitors of nucleotide synthesis: Secondary | ICD-10-CM | POA: Diagnosis not present

## 2022-06-25 DIAGNOSIS — R609 Edema, unspecified: Secondary | ICD-10-CM | POA: Diagnosis not present

## 2022-06-26 DIAGNOSIS — D1809 Hemangioma of other sites: Secondary | ICD-10-CM | POA: Diagnosis not present

## 2022-06-26 DIAGNOSIS — N281 Cyst of kidney, acquired: Secondary | ICD-10-CM | POA: Diagnosis not present

## 2022-06-26 DIAGNOSIS — M7989 Other specified soft tissue disorders: Secondary | ICD-10-CM | POA: Diagnosis not present

## 2022-06-26 DIAGNOSIS — D1803 Hemangioma of intra-abdominal structures: Secondary | ICD-10-CM | POA: Diagnosis not present

## 2022-06-26 DIAGNOSIS — Z940 Kidney transplant status: Secondary | ICD-10-CM | POA: Diagnosis not present

## 2022-07-02 DIAGNOSIS — Z79621 Long term (current) use of calcineurin inhibitor: Secondary | ICD-10-CM | POA: Diagnosis not present

## 2022-07-02 DIAGNOSIS — N281 Cyst of kidney, acquired: Secondary | ICD-10-CM | POA: Diagnosis not present

## 2022-07-02 DIAGNOSIS — Z940 Kidney transplant status: Secondary | ICD-10-CM | POA: Diagnosis not present

## 2022-07-02 DIAGNOSIS — Z79624 Long term (current) use of inhibitors of nucleotide synthesis: Secondary | ICD-10-CM | POA: Diagnosis not present

## 2022-07-02 DIAGNOSIS — D8989 Other specified disorders involving the immune mechanism, not elsewhere classified: Secondary | ICD-10-CM | POA: Diagnosis not present

## 2022-07-02 DIAGNOSIS — I10 Essential (primary) hypertension: Secondary | ICD-10-CM | POA: Diagnosis not present

## 2022-07-02 DIAGNOSIS — E785 Hyperlipidemia, unspecified: Secondary | ICD-10-CM | POA: Diagnosis not present

## 2022-07-02 DIAGNOSIS — Z79899 Other long term (current) drug therapy: Secondary | ICD-10-CM | POA: Diagnosis not present

## 2022-07-02 DIAGNOSIS — D649 Anemia, unspecified: Secondary | ICD-10-CM | POA: Diagnosis not present

## 2022-07-02 DIAGNOSIS — Z4822 Encounter for aftercare following kidney transplant: Secondary | ICD-10-CM | POA: Diagnosis not present

## 2022-07-02 DIAGNOSIS — E872 Acidosis, unspecified: Secondary | ICD-10-CM | POA: Diagnosis not present

## 2022-07-02 DIAGNOSIS — Z131 Encounter for screening for diabetes mellitus: Secondary | ICD-10-CM | POA: Diagnosis not present

## 2022-07-02 DIAGNOSIS — Z792 Long term (current) use of antibiotics: Secondary | ICD-10-CM | POA: Diagnosis not present

## 2022-07-02 DIAGNOSIS — Z7952 Long term (current) use of systemic steroids: Secondary | ICD-10-CM | POA: Diagnosis not present

## 2022-07-02 DIAGNOSIS — M069 Rheumatoid arthritis, unspecified: Secondary | ICD-10-CM | POA: Diagnosis not present

## 2022-07-02 DIAGNOSIS — K219 Gastro-esophageal reflux disease without esophagitis: Secondary | ICD-10-CM | POA: Diagnosis not present

## 2022-07-16 DIAGNOSIS — B259 Cytomegaloviral disease, unspecified: Secondary | ICD-10-CM | POA: Diagnosis not present

## 2022-07-16 DIAGNOSIS — Z79624 Long term (current) use of inhibitors of nucleotide synthesis: Secondary | ICD-10-CM | POA: Diagnosis not present

## 2022-07-16 DIAGNOSIS — I10 Essential (primary) hypertension: Secondary | ICD-10-CM | POA: Diagnosis not present

## 2022-07-16 DIAGNOSIS — E785 Hyperlipidemia, unspecified: Secondary | ICD-10-CM | POA: Diagnosis not present

## 2022-07-16 DIAGNOSIS — M069 Rheumatoid arthritis, unspecified: Secondary | ICD-10-CM | POA: Diagnosis not present

## 2022-07-16 DIAGNOSIS — Z792 Long term (current) use of antibiotics: Secondary | ICD-10-CM | POA: Diagnosis not present

## 2022-07-16 DIAGNOSIS — N281 Cyst of kidney, acquired: Secondary | ICD-10-CM | POA: Diagnosis not present

## 2022-07-16 DIAGNOSIS — Z79899 Other long term (current) drug therapy: Secondary | ICD-10-CM | POA: Diagnosis not present

## 2022-07-16 DIAGNOSIS — Z7952 Long term (current) use of systemic steroids: Secondary | ICD-10-CM | POA: Diagnosis not present

## 2022-07-16 DIAGNOSIS — Z4822 Encounter for aftercare following kidney transplant: Secondary | ICD-10-CM | POA: Diagnosis not present

## 2022-07-16 DIAGNOSIS — K449 Diaphragmatic hernia without obstruction or gangrene: Secondary | ICD-10-CM | POA: Diagnosis not present

## 2022-07-16 DIAGNOSIS — Z882 Allergy status to sulfonamides status: Secondary | ICD-10-CM | POA: Diagnosis not present

## 2022-07-16 DIAGNOSIS — Z79621 Long term (current) use of calcineurin inhibitor: Secondary | ICD-10-CM | POA: Diagnosis not present

## 2022-07-16 DIAGNOSIS — K219 Gastro-esophageal reflux disease without esophagitis: Secondary | ICD-10-CM | POA: Diagnosis not present

## 2022-07-16 DIAGNOSIS — Z5181 Encounter for therapeutic drug level monitoring: Secondary | ICD-10-CM | POA: Diagnosis not present

## 2022-07-16 DIAGNOSIS — E872 Acidosis, unspecified: Secondary | ICD-10-CM | POA: Diagnosis not present

## 2022-07-16 DIAGNOSIS — D849 Immunodeficiency, unspecified: Secondary | ICD-10-CM | POA: Diagnosis not present

## 2022-07-16 DIAGNOSIS — R609 Edema, unspecified: Secondary | ICD-10-CM | POA: Diagnosis not present

## 2022-07-16 DIAGNOSIS — D649 Anemia, unspecified: Secondary | ICD-10-CM | POA: Diagnosis not present

## 2022-07-16 DIAGNOSIS — Z940 Kidney transplant status: Secondary | ICD-10-CM | POA: Diagnosis not present

## 2022-07-30 DIAGNOSIS — K219 Gastro-esophageal reflux disease without esophagitis: Secondary | ICD-10-CM | POA: Diagnosis not present

## 2022-07-30 DIAGNOSIS — M792 Neuralgia and neuritis, unspecified: Secondary | ICD-10-CM | POA: Diagnosis not present

## 2022-07-30 DIAGNOSIS — D649 Anemia, unspecified: Secondary | ICD-10-CM | POA: Diagnosis not present

## 2022-07-30 DIAGNOSIS — I10 Essential (primary) hypertension: Secondary | ICD-10-CM | POA: Diagnosis not present

## 2022-07-30 DIAGNOSIS — E872 Acidosis, unspecified: Secondary | ICD-10-CM | POA: Diagnosis not present

## 2022-07-30 DIAGNOSIS — Z4822 Encounter for aftercare following kidney transplant: Secondary | ICD-10-CM | POA: Diagnosis not present

## 2022-07-30 DIAGNOSIS — Z79621 Long term (current) use of calcineurin inhibitor: Secondary | ICD-10-CM | POA: Diagnosis not present

## 2022-07-30 DIAGNOSIS — K449 Diaphragmatic hernia without obstruction or gangrene: Secondary | ICD-10-CM | POA: Diagnosis not present

## 2022-07-30 DIAGNOSIS — Z79624 Long term (current) use of inhibitors of nucleotide synthesis: Secondary | ICD-10-CM | POA: Diagnosis not present

## 2022-07-30 DIAGNOSIS — M069 Rheumatoid arthritis, unspecified: Secondary | ICD-10-CM | POA: Diagnosis not present

## 2022-07-30 DIAGNOSIS — Z79899 Other long term (current) drug therapy: Secondary | ICD-10-CM | POA: Diagnosis not present

## 2022-07-30 DIAGNOSIS — Z7952 Long term (current) use of systemic steroids: Secondary | ICD-10-CM | POA: Diagnosis not present

## 2022-07-30 DIAGNOSIS — Z8639 Personal history of other endocrine, nutritional and metabolic disease: Secondary | ICD-10-CM | POA: Diagnosis not present

## 2022-07-30 DIAGNOSIS — N281 Cyst of kidney, acquired: Secondary | ICD-10-CM | POA: Diagnosis not present

## 2022-08-07 DIAGNOSIS — Z940 Kidney transplant status: Secondary | ICD-10-CM | POA: Diagnosis not present

## 2022-08-07 DIAGNOSIS — Z4822 Encounter for aftercare following kidney transplant: Secondary | ICD-10-CM | POA: Diagnosis not present

## 2022-08-07 DIAGNOSIS — N179 Acute kidney failure, unspecified: Secondary | ICD-10-CM | POA: Diagnosis not present

## 2022-08-07 DIAGNOSIS — R8279 Other abnormal findings on microbiological examination of urine: Secondary | ICD-10-CM | POA: Diagnosis not present

## 2022-08-13 DIAGNOSIS — I10 Essential (primary) hypertension: Secondary | ICD-10-CM | POA: Diagnosis not present

## 2022-08-13 DIAGNOSIS — Z79621 Long term (current) use of calcineurin inhibitor: Secondary | ICD-10-CM | POA: Diagnosis not present

## 2022-08-13 DIAGNOSIS — Z940 Kidney transplant status: Secondary | ICD-10-CM | POA: Diagnosis not present

## 2022-08-13 DIAGNOSIS — J0110 Acute frontal sinusitis, unspecified: Secondary | ICD-10-CM | POA: Diagnosis not present

## 2022-08-13 DIAGNOSIS — Z4822 Encounter for aftercare following kidney transplant: Secondary | ICD-10-CM | POA: Diagnosis not present

## 2022-08-13 DIAGNOSIS — R0981 Nasal congestion: Secondary | ICD-10-CM | POA: Diagnosis not present

## 2022-08-13 DIAGNOSIS — D849 Immunodeficiency, unspecified: Secondary | ICD-10-CM | POA: Diagnosis not present

## 2022-08-13 DIAGNOSIS — Z5181 Encounter for therapeutic drug level monitoring: Secondary | ICD-10-CM | POA: Diagnosis not present

## 2022-08-22 DIAGNOSIS — M069 Rheumatoid arthritis, unspecified: Secondary | ICD-10-CM | POA: Diagnosis not present

## 2022-08-22 DIAGNOSIS — R200 Anesthesia of skin: Secondary | ICD-10-CM | POA: Diagnosis not present

## 2022-08-22 DIAGNOSIS — R0989 Other specified symptoms and signs involving the circulatory and respiratory systems: Secondary | ICD-10-CM | POA: Diagnosis not present

## 2022-08-22 DIAGNOSIS — J329 Chronic sinusitis, unspecified: Secondary | ICD-10-CM | POA: Diagnosis not present

## 2022-08-22 DIAGNOSIS — D8481 Immunodeficiency due to conditions classified elsewhere: Secondary | ICD-10-CM | POA: Diagnosis not present

## 2022-08-22 DIAGNOSIS — Z03818 Encounter for observation for suspected exposure to other biological agents ruled out: Secondary | ICD-10-CM | POA: Diagnosis not present

## 2022-08-22 DIAGNOSIS — N185 Chronic kidney disease, stage 5: Secondary | ICD-10-CM | POA: Diagnosis not present

## 2022-08-22 DIAGNOSIS — Z940 Kidney transplant status: Secondary | ICD-10-CM | POA: Diagnosis not present

## 2022-08-22 DIAGNOSIS — M79651 Pain in right thigh: Secondary | ICD-10-CM | POA: Diagnosis not present

## 2022-08-27 DIAGNOSIS — I10 Essential (primary) hypertension: Secondary | ICD-10-CM | POA: Diagnosis not present

## 2022-08-27 DIAGNOSIS — D849 Immunodeficiency, unspecified: Secondary | ICD-10-CM | POA: Diagnosis not present

## 2022-08-27 DIAGNOSIS — Z940 Kidney transplant status: Secondary | ICD-10-CM | POA: Diagnosis not present

## 2022-09-09 DIAGNOSIS — M6281 Muscle weakness (generalized): Secondary | ICD-10-CM | POA: Diagnosis not present

## 2022-09-09 DIAGNOSIS — M79604 Pain in right leg: Secondary | ICD-10-CM | POA: Diagnosis not present

## 2022-09-09 DIAGNOSIS — R262 Difficulty in walking, not elsewhere classified: Secondary | ICD-10-CM | POA: Diagnosis not present

## 2022-09-09 DIAGNOSIS — R202 Paresthesia of skin: Secondary | ICD-10-CM | POA: Diagnosis not present

## 2022-09-10 DIAGNOSIS — Z940 Kidney transplant status: Secondary | ICD-10-CM | POA: Diagnosis not present

## 2022-09-19 DIAGNOSIS — M79604 Pain in right leg: Secondary | ICD-10-CM | POA: Diagnosis not present

## 2022-09-19 DIAGNOSIS — R202 Paresthesia of skin: Secondary | ICD-10-CM | POA: Diagnosis not present

## 2022-09-19 DIAGNOSIS — M6281 Muscle weakness (generalized): Secondary | ICD-10-CM | POA: Diagnosis not present

## 2022-09-19 DIAGNOSIS — R262 Difficulty in walking, not elsewhere classified: Secondary | ICD-10-CM | POA: Diagnosis not present

## 2022-09-20 DIAGNOSIS — T8619 Other complication of kidney transplant: Secondary | ICD-10-CM | POA: Diagnosis not present

## 2022-09-20 DIAGNOSIS — I10 Essential (primary) hypertension: Secondary | ICD-10-CM | POA: Diagnosis not present

## 2022-09-20 DIAGNOSIS — D849 Immunodeficiency, unspecified: Secondary | ICD-10-CM | POA: Diagnosis not present

## 2022-09-20 DIAGNOSIS — Z940 Kidney transplant status: Secondary | ICD-10-CM | POA: Diagnosis not present

## 2022-09-20 DIAGNOSIS — N184 Chronic kidney disease, stage 4 (severe): Secondary | ICD-10-CM | POA: Diagnosis not present

## 2022-09-20 DIAGNOSIS — N179 Acute kidney failure, unspecified: Secondary | ICD-10-CM | POA: Diagnosis not present

## 2022-09-20 DIAGNOSIS — Z4822 Encounter for aftercare following kidney transplant: Secondary | ICD-10-CM | POA: Diagnosis not present

## 2022-09-20 DIAGNOSIS — D631 Anemia in chronic kidney disease: Secondary | ICD-10-CM | POA: Diagnosis not present

## 2022-09-20 DIAGNOSIS — K2100 Gastro-esophageal reflux disease with esophagitis, without bleeding: Secondary | ICD-10-CM | POA: Diagnosis not present

## 2022-09-20 DIAGNOSIS — Z79899 Other long term (current) drug therapy: Secondary | ICD-10-CM | POA: Diagnosis not present

## 2022-10-02 DIAGNOSIS — I10 Essential (primary) hypertension: Secondary | ICD-10-CM | POA: Diagnosis not present

## 2022-10-02 DIAGNOSIS — D849 Immunodeficiency, unspecified: Secondary | ICD-10-CM | POA: Diagnosis not present

## 2022-10-02 DIAGNOSIS — K2100 Gastro-esophageal reflux disease with esophagitis, without bleeding: Secondary | ICD-10-CM | POA: Diagnosis not present

## 2022-10-02 DIAGNOSIS — Z940 Kidney transplant status: Secondary | ICD-10-CM | POA: Diagnosis not present

## 2022-10-16 DIAGNOSIS — Z940 Kidney transplant status: Secondary | ICD-10-CM | POA: Diagnosis not present

## 2022-10-29 DIAGNOSIS — N184 Chronic kidney disease, stage 4 (severe): Secondary | ICD-10-CM | POA: Diagnosis not present

## 2022-10-29 DIAGNOSIS — E7800 Pure hypercholesterolemia, unspecified: Secondary | ICD-10-CM | POA: Diagnosis not present

## 2022-10-29 DIAGNOSIS — E039 Hypothyroidism, unspecified: Secondary | ICD-10-CM | POA: Diagnosis not present

## 2022-10-29 DIAGNOSIS — D638 Anemia in other chronic diseases classified elsewhere: Secondary | ICD-10-CM | POA: Diagnosis not present

## 2022-10-29 DIAGNOSIS — K219 Gastro-esophageal reflux disease without esophagitis: Secondary | ICD-10-CM | POA: Diagnosis not present

## 2022-10-30 DIAGNOSIS — Z940 Kidney transplant status: Secondary | ICD-10-CM | POA: Diagnosis not present

## 2022-10-30 DIAGNOSIS — D849 Immunodeficiency, unspecified: Secondary | ICD-10-CM | POA: Diagnosis not present

## 2022-10-30 DIAGNOSIS — D649 Anemia, unspecified: Secondary | ICD-10-CM | POA: Diagnosis not present

## 2022-10-30 DIAGNOSIS — Z79621 Long term (current) use of calcineurin inhibitor: Secondary | ICD-10-CM | POA: Diagnosis not present

## 2022-10-30 DIAGNOSIS — B259 Cytomegaloviral disease, unspecified: Secondary | ICD-10-CM | POA: Diagnosis not present

## 2022-10-30 DIAGNOSIS — Z5181 Encounter for therapeutic drug level monitoring: Secondary | ICD-10-CM | POA: Diagnosis not present

## 2022-10-30 DIAGNOSIS — Z79899 Other long term (current) drug therapy: Secondary | ICD-10-CM | POA: Diagnosis not present

## 2022-10-30 DIAGNOSIS — B369 Superficial mycosis, unspecified: Secondary | ICD-10-CM | POA: Diagnosis not present

## 2022-10-30 DIAGNOSIS — Z4822 Encounter for aftercare following kidney transplant: Secondary | ICD-10-CM | POA: Diagnosis not present

## 2022-10-30 DIAGNOSIS — R768 Other specified abnormal immunological findings in serum: Secondary | ICD-10-CM | POA: Diagnosis not present

## 2022-11-05 DIAGNOSIS — Z79624 Long term (current) use of inhibitors of nucleotide synthesis: Secondary | ICD-10-CM | POA: Diagnosis not present

## 2022-11-05 DIAGNOSIS — Z79621 Long term (current) use of calcineurin inhibitor: Secondary | ICD-10-CM | POA: Diagnosis not present

## 2022-11-05 DIAGNOSIS — N185 Chronic kidney disease, stage 5: Secondary | ICD-10-CM | POA: Diagnosis not present

## 2022-11-05 DIAGNOSIS — Z9884 Bariatric surgery status: Secondary | ICD-10-CM | POA: Diagnosis not present

## 2022-11-05 DIAGNOSIS — I120 Hypertensive chronic kidney disease with stage 5 chronic kidney disease or end stage renal disease: Secondary | ICD-10-CM | POA: Diagnosis not present

## 2022-11-05 DIAGNOSIS — Z79899 Other long term (current) drug therapy: Secondary | ICD-10-CM | POA: Diagnosis not present

## 2022-11-05 DIAGNOSIS — K317 Polyp of stomach and duodenum: Secondary | ICD-10-CM | POA: Diagnosis not present

## 2022-11-05 DIAGNOSIS — M069 Rheumatoid arthritis, unspecified: Secondary | ICD-10-CM | POA: Diagnosis not present

## 2022-11-05 DIAGNOSIS — K219 Gastro-esophageal reflux disease without esophagitis: Secondary | ICD-10-CM | POA: Diagnosis not present

## 2022-11-05 DIAGNOSIS — Z903 Acquired absence of stomach [part of]: Secondary | ICD-10-CM | POA: Diagnosis not present

## 2022-11-05 DIAGNOSIS — K2289 Other specified disease of esophagus: Secondary | ICD-10-CM | POA: Diagnosis not present

## 2022-11-05 DIAGNOSIS — B3781 Candidal esophagitis: Secondary | ICD-10-CM | POA: Diagnosis not present

## 2022-11-07 DIAGNOSIS — Z940 Kidney transplant status: Secondary | ICD-10-CM | POA: Diagnosis not present

## 2022-11-07 DIAGNOSIS — M0609 Rheumatoid arthritis without rheumatoid factor, multiple sites: Secondary | ICD-10-CM | POA: Diagnosis not present

## 2022-11-07 DIAGNOSIS — R5383 Other fatigue: Secondary | ICD-10-CM | POA: Diagnosis not present

## 2022-11-07 DIAGNOSIS — L732 Hidradenitis suppurativa: Secondary | ICD-10-CM | POA: Diagnosis not present

## 2022-11-07 DIAGNOSIS — Z79899 Other long term (current) drug therapy: Secondary | ICD-10-CM | POA: Diagnosis not present

## 2022-11-13 DIAGNOSIS — Z940 Kidney transplant status: Secondary | ICD-10-CM | POA: Diagnosis not present

## 2022-11-20 DIAGNOSIS — Z940 Kidney transplant status: Secondary | ICD-10-CM | POA: Diagnosis not present

## 2022-11-27 DIAGNOSIS — R7303 Prediabetes: Secondary | ICD-10-CM | POA: Diagnosis not present

## 2022-11-27 DIAGNOSIS — Z79899 Other long term (current) drug therapy: Secondary | ICD-10-CM | POA: Diagnosis not present

## 2022-11-27 DIAGNOSIS — Z5181 Encounter for therapeutic drug level monitoring: Secondary | ICD-10-CM | POA: Diagnosis not present

## 2022-11-27 DIAGNOSIS — Z4822 Encounter for aftercare following kidney transplant: Secondary | ICD-10-CM | POA: Diagnosis not present

## 2022-11-27 DIAGNOSIS — Z9884 Bariatric surgery status: Secondary | ICD-10-CM | POA: Diagnosis not present

## 2022-11-27 DIAGNOSIS — K2100 Gastro-esophageal reflux disease with esophagitis, without bleeding: Secondary | ICD-10-CM | POA: Diagnosis not present

## 2022-11-27 DIAGNOSIS — Z940 Kidney transplant status: Secondary | ICD-10-CM | POA: Diagnosis not present

## 2022-11-27 DIAGNOSIS — I10 Essential (primary) hypertension: Secondary | ICD-10-CM | POA: Diagnosis not present

## 2022-11-27 DIAGNOSIS — D849 Immunodeficiency, unspecified: Secondary | ICD-10-CM | POA: Diagnosis not present

## 2022-11-27 DIAGNOSIS — Z79621 Long term (current) use of calcineurin inhibitor: Secondary | ICD-10-CM | POA: Diagnosis not present

## 2022-11-27 DIAGNOSIS — B3781 Candidal esophagitis: Secondary | ICD-10-CM | POA: Diagnosis not present

## 2022-12-11 DIAGNOSIS — Z940 Kidney transplant status: Secondary | ICD-10-CM | POA: Diagnosis not present

## 2022-12-25 DIAGNOSIS — Z940 Kidney transplant status: Secondary | ICD-10-CM | POA: Diagnosis not present

## 2022-12-25 DIAGNOSIS — D849 Immunodeficiency, unspecified: Secondary | ICD-10-CM | POA: Diagnosis not present

## 2022-12-25 DIAGNOSIS — I10 Essential (primary) hypertension: Secondary | ICD-10-CM | POA: Diagnosis not present

## 2022-12-25 DIAGNOSIS — Z4822 Encounter for aftercare following kidney transplant: Secondary | ICD-10-CM | POA: Diagnosis not present

## 2022-12-25 DIAGNOSIS — Z5181 Encounter for therapeutic drug level monitoring: Secondary | ICD-10-CM | POA: Diagnosis not present

## 2022-12-25 DIAGNOSIS — B3781 Candidal esophagitis: Secondary | ICD-10-CM | POA: Diagnosis not present

## 2022-12-25 DIAGNOSIS — Z79621 Long term (current) use of calcineurin inhibitor: Secondary | ICD-10-CM | POA: Diagnosis not present

## 2023-01-03 DIAGNOSIS — Z940 Kidney transplant status: Secondary | ICD-10-CM | POA: Diagnosis not present

## 2023-01-13 DIAGNOSIS — I10 Essential (primary) hypertension: Secondary | ICD-10-CM | POA: Diagnosis not present

## 2023-01-13 DIAGNOSIS — Z1231 Encounter for screening mammogram for malignant neoplasm of breast: Secondary | ICD-10-CM | POA: Diagnosis not present

## 2023-01-13 DIAGNOSIS — Z139 Encounter for screening, unspecified: Secondary | ICD-10-CM | POA: Diagnosis not present

## 2023-01-13 DIAGNOSIS — Z940 Kidney transplant status: Secondary | ICD-10-CM | POA: Diagnosis not present

## 2023-01-13 DIAGNOSIS — Z1211 Encounter for screening for malignant neoplasm of colon: Secondary | ICD-10-CM | POA: Diagnosis not present

## 2023-01-17 ENCOUNTER — Other Ambulatory Visit: Payer: Self-pay | Admitting: Obstetrics and Gynecology

## 2023-01-17 DIAGNOSIS — R928 Other abnormal and inconclusive findings on diagnostic imaging of breast: Secondary | ICD-10-CM

## 2023-01-20 ENCOUNTER — Encounter (HOSPITAL_COMMUNITY): Payer: Self-pay

## 2023-01-22 DIAGNOSIS — Z940 Kidney transplant status: Secondary | ICD-10-CM | POA: Diagnosis not present

## 2023-01-31 ENCOUNTER — Other Ambulatory Visit: Payer: Self-pay | Admitting: Obstetrics and Gynecology

## 2023-01-31 ENCOUNTER — Ambulatory Visit
Admission: RE | Admit: 2023-01-31 | Discharge: 2023-01-31 | Disposition: A | Discharge status: Home / Self Care | Payer: 59 | Source: Ambulatory Visit | Attending: Obstetrics and Gynecology | Admitting: Obstetrics and Gynecology

## 2023-01-31 ENCOUNTER — Ambulatory Visit
Admission: RE | Admit: 2023-01-31 | Discharge: 2023-01-31 | Disposition: A | Discharge status: Home / Self Care | Payer: Medicare Other | Source: Ambulatory Visit | Attending: Obstetrics and Gynecology | Admitting: Obstetrics and Gynecology

## 2023-01-31 DIAGNOSIS — R928 Other abnormal and inconclusive findings on diagnostic imaging of breast: Secondary | ICD-10-CM

## 2023-01-31 DIAGNOSIS — N6310 Unspecified lump in the right breast, unspecified quadrant: Secondary | ICD-10-CM

## 2023-01-31 DIAGNOSIS — N6311 Unspecified lump in the right breast, upper outer quadrant: Secondary | ICD-10-CM | POA: Diagnosis not present

## 2023-02-03 ENCOUNTER — Ambulatory Visit
Admission: RE | Admit: 2023-02-03 | Discharge: 2023-02-03 | Disposition: A | Discharge status: Home / Self Care | Payer: 59 | Source: Ambulatory Visit | Attending: Obstetrics and Gynecology | Admitting: Obstetrics and Gynecology

## 2023-02-03 DIAGNOSIS — R928 Other abnormal and inconclusive findings on diagnostic imaging of breast: Secondary | ICD-10-CM

## 2023-02-03 DIAGNOSIS — N6310 Unspecified lump in the right breast, unspecified quadrant: Secondary | ICD-10-CM

## 2023-02-05 ENCOUNTER — Ambulatory Visit
Admission: RE | Admit: 2023-02-05 | Discharge: 2023-02-05 | Disposition: A | Discharge status: Home / Self Care | Payer: 59 | Source: Ambulatory Visit | Attending: Obstetrics and Gynecology | Admitting: Obstetrics and Gynecology

## 2023-02-05 ENCOUNTER — Other Ambulatory Visit: Payer: 59

## 2023-02-05 DIAGNOSIS — N951 Menopausal and female climacteric states: Secondary | ICD-10-CM

## 2023-02-05 DIAGNOSIS — N641 Fat necrosis of breast: Secondary | ICD-10-CM | POA: Diagnosis not present

## 2023-02-05 DIAGNOSIS — N6310 Unspecified lump in the right breast, unspecified quadrant: Secondary | ICD-10-CM

## 2023-02-05 DIAGNOSIS — N6311 Unspecified lump in the right breast, upper outer quadrant: Secondary | ICD-10-CM | POA: Diagnosis not present

## 2023-02-05 HISTORY — PX: BREAST BIOPSY: SHX20

## 2023-02-07 ENCOUNTER — Other Ambulatory Visit: Payer: 59

## 2023-02-19 DIAGNOSIS — Z940 Kidney transplant status: Secondary | ICD-10-CM | POA: Diagnosis not present

## 2023-03-10 DIAGNOSIS — J209 Acute bronchitis, unspecified: Secondary | ICD-10-CM | POA: Diagnosis not present

## 2023-03-10 DIAGNOSIS — R0981 Nasal congestion: Secondary | ICD-10-CM | POA: Diagnosis not present

## 2023-03-10 DIAGNOSIS — Z03818 Encounter for observation for suspected exposure to other biological agents ruled out: Secondary | ICD-10-CM | POA: Diagnosis not present

## 2023-03-10 DIAGNOSIS — R051 Acute cough: Secondary | ICD-10-CM | POA: Diagnosis not present

## 2023-03-19 DIAGNOSIS — Z940 Kidney transplant status: Secondary | ICD-10-CM | POA: Diagnosis not present

## 2023-03-19 DIAGNOSIS — Z13220 Encounter for screening for lipoid disorders: Secondary | ICD-10-CM | POA: Diagnosis not present

## 2023-04-15 DIAGNOSIS — Z940 Kidney transplant status: Secondary | ICD-10-CM | POA: Diagnosis not present

## 2023-04-21 DIAGNOSIS — Z940 Kidney transplant status: Secondary | ICD-10-CM | POA: Diagnosis not present

## 2023-04-23 DIAGNOSIS — I10 Essential (primary) hypertension: Secondary | ICD-10-CM | POA: Diagnosis not present

## 2023-04-23 DIAGNOSIS — Z0000 Encounter for general adult medical examination without abnormal findings: Secondary | ICD-10-CM | POA: Diagnosis not present

## 2023-04-23 DIAGNOSIS — R7303 Prediabetes: Secondary | ICD-10-CM | POA: Diagnosis not present

## 2023-04-23 DIAGNOSIS — Z23 Encounter for immunization: Secondary | ICD-10-CM | POA: Diagnosis not present

## 2023-04-23 DIAGNOSIS — M069 Rheumatoid arthritis, unspecified: Secondary | ICD-10-CM | POA: Diagnosis not present

## 2023-04-23 DIAGNOSIS — E039 Hypothyroidism, unspecified: Secondary | ICD-10-CM | POA: Diagnosis not present

## 2023-04-23 DIAGNOSIS — N185 Chronic kidney disease, stage 5: Secondary | ICD-10-CM | POA: Diagnosis not present

## 2023-04-23 DIAGNOSIS — E7800 Pure hypercholesterolemia, unspecified: Secondary | ICD-10-CM | POA: Diagnosis not present

## 2023-04-23 DIAGNOSIS — D8481 Immunodeficiency due to conditions classified elsewhere: Secondary | ICD-10-CM | POA: Diagnosis not present

## 2023-04-24 ENCOUNTER — Encounter (HOSPITAL_COMMUNITY): Payer: Self-pay

## 2023-04-25 ENCOUNTER — Other Ambulatory Visit: Payer: Self-pay | Admitting: Internal Medicine

## 2023-04-25 DIAGNOSIS — Z13820 Encounter for screening for osteoporosis: Secondary | ICD-10-CM

## 2023-04-25 DIAGNOSIS — Z7952 Long term (current) use of systemic steroids: Secondary | ICD-10-CM

## 2023-05-13 DIAGNOSIS — Z940 Kidney transplant status: Secondary | ICD-10-CM | POA: Diagnosis not present

## 2023-05-14 ENCOUNTER — Ambulatory Visit (INDEPENDENT_AMBULATORY_CARE_PROVIDER_SITE_OTHER): Payer: 59

## 2023-05-14 ENCOUNTER — Ambulatory Visit (INDEPENDENT_AMBULATORY_CARE_PROVIDER_SITE_OTHER): Payer: 59 | Admitting: Podiatry

## 2023-05-14 DIAGNOSIS — M778 Other enthesopathies, not elsewhere classified: Secondary | ICD-10-CM

## 2023-05-14 DIAGNOSIS — M722 Plantar fascial fibromatosis: Secondary | ICD-10-CM | POA: Diagnosis not present

## 2023-05-14 DIAGNOSIS — R5383 Other fatigue: Secondary | ICD-10-CM | POA: Diagnosis not present

## 2023-05-14 DIAGNOSIS — Z79899 Other long term (current) drug therapy: Secondary | ICD-10-CM | POA: Diagnosis not present

## 2023-05-14 DIAGNOSIS — M0609 Rheumatoid arthritis without rheumatoid factor, multiple sites: Secondary | ICD-10-CM | POA: Diagnosis not present

## 2023-05-14 DIAGNOSIS — L732 Hidradenitis suppurativa: Secondary | ICD-10-CM | POA: Diagnosis not present

## 2023-05-14 MED ORDER — METHYLPREDNISOLONE 4 MG PO TBPK: ORAL_TABLET | ORAL | 0 refills | Status: AC

## 2023-05-14 MED ORDER — BETAMETHASONE SOD PHOS & ACET 6 (3-3) MG/ML IJ SUSP
3.0000 mg | Freq: Once | INTRAMUSCULAR | Status: AC
  Administered 2023-05-14: 3 mg via INTRA_ARTICULAR

## 2023-05-14 NOTE — Progress Notes (Signed)
   No chief complaint on file.   Subjective: 62 y.o. female presenting today for evaluation of right heel pain ongoing for about 2-3 months now.  Idiopathic gradual onset.  She does have a history of plantar fasciitis.  Currently she has not been anything for treatment other than purchasing new tennis shoes   Past Medical History:  Diagnosis Date   Allergic rhinitis    sees allergist   Anemia    sickle cell trait, saw hematology   Anxiety    BV (bacterial vaginosis)    CKD (chronic kidney disease)    Complication of anesthesia    itching   Diverticulosis    Endometrial mass    benign   GERD (gastroesophageal reflux disease)    Hydradenitis    sees Dr. Magnus Ivan, s/p multiple surgeries, on disability   Hypertension    Hyperthyroidism    Obesity    Rheumatoid arthritis (HCC)    sees Dr. Nickola Major   Vitamin D deficiency      Objective: Physical Exam General: The patient is alert and oriented x3 in no acute distress.  Dermatology: Skin is warm, dry and supple bilateral lower extremities. Negative for open lesions or macerations bilateral.   Vascular: Dorsalis Pedis and Posterior Tibial pulses palpable bilateral.  Capillary fill time is immediate to all digits.  Neurological: Grossly intact via light touch  Musculoskeletal: Tenderness to palpation to the plantar aspect of the right heel along the plantar fascia. All other joints range of motion within normal limits bilateral. Strength 5/5 in all groups bilateral.   Radiographic exam RT foot 05/14/2023: Normal osseous mineralization. Joint spaces preserved. No fracture/dislocation/boney destruction. No other soft tissue abnormalities or radiopaque foreign bodies.   Assessment: 1. Plantar fasciitis right  Plan of Care:  -Patient evaluated. Xrays reviewed.   -Injection of 0.5cc Celestone soluspan injected into the right plantar fascia  -Kidney transplant recipient.  No NSAIDs -Patient is on chronic 5 mg prednisone daily.   Prescription for Medrol Dosepak, then resume her daily regimen -Continue Brooks running shoes and advised against going barefoot -Return to clinic 4 weeks  *Dr. Leonides Cave referral   Felecia Shelling, DPM [facility]  Dr. Felecia Shelling, DPM    [address] [place], [place] [address]                Office [phone]  Fax [phone]

## 2023-06-09 ENCOUNTER — Encounter (HOSPITAL_COMMUNITY): Payer: Self-pay

## 2023-06-12 DIAGNOSIS — E7800 Pure hypercholesterolemia, unspecified: Secondary | ICD-10-CM | POA: Diagnosis not present

## 2023-06-12 DIAGNOSIS — N184 Chronic kidney disease, stage 4 (severe): Secondary | ICD-10-CM | POA: Diagnosis not present

## 2023-06-12 DIAGNOSIS — E8889 Other specified metabolic disorders: Secondary | ICD-10-CM | POA: Diagnosis not present

## 2023-06-12 DIAGNOSIS — I10 Essential (primary) hypertension: Secondary | ICD-10-CM | POA: Diagnosis not present

## 2023-06-12 DIAGNOSIS — Z940 Kidney transplant status: Secondary | ICD-10-CM | POA: Diagnosis not present

## 2023-06-12 DIAGNOSIS — Z5181 Encounter for therapeutic drug level monitoring: Secondary | ICD-10-CM | POA: Diagnosis not present

## 2023-06-12 DIAGNOSIS — Z79621 Long term (current) use of calcineurin inhibitor: Secondary | ICD-10-CM | POA: Diagnosis not present

## 2023-06-12 DIAGNOSIS — M069 Rheumatoid arthritis, unspecified: Secondary | ICD-10-CM | POA: Diagnosis not present

## 2023-06-12 DIAGNOSIS — Z9889 Other specified postprocedural states: Secondary | ICD-10-CM | POA: Diagnosis not present

## 2023-06-12 DIAGNOSIS — D849 Immunodeficiency, unspecified: Secondary | ICD-10-CM | POA: Diagnosis not present

## 2023-06-12 DIAGNOSIS — R5383 Other fatigue: Secondary | ICD-10-CM | POA: Diagnosis not present

## 2023-06-12 DIAGNOSIS — Z7969 Long term (current) use of other immunomodulators and immunosuppressants: Secondary | ICD-10-CM | POA: Diagnosis not present

## 2023-06-12 DIAGNOSIS — K219 Gastro-esophageal reflux disease without esophagitis: Secondary | ICD-10-CM | POA: Diagnosis not present

## 2023-06-12 DIAGNOSIS — Z4822 Encounter for aftercare following kidney transplant: Secondary | ICD-10-CM | POA: Diagnosis not present

## 2023-06-16 ENCOUNTER — Ambulatory Visit: Payer: 59 | Admitting: Podiatry

## 2023-06-24 ENCOUNTER — Encounter (HOSPITAL_COMMUNITY): Payer: Self-pay

## 2023-06-26 DIAGNOSIS — M069 Rheumatoid arthritis, unspecified: Secondary | ICD-10-CM | POA: Diagnosis not present

## 2023-06-26 DIAGNOSIS — R5383 Other fatigue: Secondary | ICD-10-CM | POA: Diagnosis not present

## 2023-06-26 DIAGNOSIS — N184 Chronic kidney disease, stage 4 (severe): Secondary | ICD-10-CM | POA: Diagnosis not present

## 2023-06-26 DIAGNOSIS — E7800 Pure hypercholesterolemia, unspecified: Secondary | ICD-10-CM | POA: Diagnosis not present

## 2023-06-26 DIAGNOSIS — K219 Gastro-esophageal reflux disease without esophagitis: Secondary | ICD-10-CM | POA: Diagnosis not present

## 2023-06-26 DIAGNOSIS — Z9889 Other specified postprocedural states: Secondary | ICD-10-CM | POA: Diagnosis not present

## 2023-07-07 ENCOUNTER — Other Ambulatory Visit: Payer: Self-pay | Admitting: Obstetrics and Gynecology

## 2023-07-07 DIAGNOSIS — N6310 Unspecified lump in the right breast, unspecified quadrant: Secondary | ICD-10-CM

## 2023-07-08 ENCOUNTER — Ambulatory Visit (INDEPENDENT_AMBULATORY_CARE_PROVIDER_SITE_OTHER): Payer: 59 | Admitting: Podiatry

## 2023-07-08 DIAGNOSIS — M722 Plantar fascial fibromatosis: Secondary | ICD-10-CM

## 2023-07-08 DIAGNOSIS — M62462 Contracture of muscle, left lower leg: Secondary | ICD-10-CM

## 2023-07-08 MED ORDER — BETAMETHASONE SOD PHOS & ACET 6 (3-3) MG/ML IJ SUSP
3.0000 mg | Freq: Once | INTRAMUSCULAR | Status: AC
  Administered 2023-07-08: 3 mg via INTRA_ARTICULAR

## 2023-07-08 NOTE — Progress Notes (Signed)
   Chief Complaint  Patient presents with   Plantar Fasciitis    "It's better but I can't do my walking."    Subjective: 63 y.o. female presenting today for follow-up evaluation of plantar fasciitis to the left heel.  Idiopathic gradual onset ongoing for about 3-4 months now.  She says the injection helped temporarily   Past Medical History:  Diagnosis Date   Allergic rhinitis    sees allergist   Anemia    sickle cell trait, saw hematology   Anxiety    BV (bacterial vaginosis)    CKD (chronic kidney disease)    Complication of anesthesia    itching   Diverticulosis    Endometrial mass    benign   GERD (gastroesophageal reflux disease)    Hydradenitis    sees Dr. Magnus Ivan, s/p multiple surgeries, on disability   Hypertension    Hyperthyroidism    Obesity    Rheumatoid arthritis (HCC)    sees Dr. Nickola Major   Vitamin D deficiency      Objective: Physical Exam General: The patient is alert and oriented x3 in no acute distress.  Dermatology: Skin is warm, dry and supple bilateral lower extremities. Negative for open lesions or macerations bilateral.   Vascular: Dorsalis Pedis and Posterior Tibial pulses palpable bilateral.  Capillary fill time is immediate to all digits.  Neurological: Grossly intact via light touch  Musculoskeletal: There continues to be some tenderness to palpation to the plantar aspect of the left heel along the plantar fascia. All other joints range of motion within normal limits bilateral. Strength 5/5 in all groups bilateral.   Radiographic exam LT foot 05/14/2023: Normal osseous mineralization. Joint spaces preserved. No fracture/dislocation/boney destruction. No other soft tissue abnormalities or radiopaque foreign bodies.   Assessment: 1. Plantar fasciitis left 2.  Equinus deformity left   Plan of Care:  -Patient evaluated.  -Stressed the importance of daily stretching exercises to alleviate the equinus deformity and help stretch out the  lower extremity.  Demonstrated today. -Injection of 0.5cc Celestone soluspan injected into the plantar fascia  -Kidney transplant recipient.  No NSAIDs -Patient is on chronic 5 mg prednisone daily.   -Continue Brooks running shoes and advised against going barefoot -Today we also discussed possibility of custom orthotics however currently they are not a feasible option due to finances -Return to clinic 4 weeks.  Advised the patient that she may require a series of injections to help alleviate her symptoms since we are limited with options and no NSAIDs.   *Dr. Leonides Cave referral   Felecia Shelling, DPM [facility]  Dr. Felecia Shelling, DPM    [address] [place], [place] [address]                Office [phone]  Fax [phone]

## 2023-07-09 ENCOUNTER — Ambulatory Visit: Payer: 59 | Admitting: Podiatry

## 2023-07-10 DIAGNOSIS — Z9889 Other specified postprocedural states: Secondary | ICD-10-CM | POA: Diagnosis not present

## 2023-07-10 DIAGNOSIS — E7800 Pure hypercholesterolemia, unspecified: Secondary | ICD-10-CM | POA: Diagnosis not present

## 2023-07-10 DIAGNOSIS — N184 Chronic kidney disease, stage 4 (severe): Secondary | ICD-10-CM | POA: Diagnosis not present

## 2023-07-10 DIAGNOSIS — M069 Rheumatoid arthritis, unspecified: Secondary | ICD-10-CM | POA: Diagnosis not present

## 2023-07-10 DIAGNOSIS — R5383 Other fatigue: Secondary | ICD-10-CM | POA: Diagnosis not present

## 2023-07-10 DIAGNOSIS — Z940 Kidney transplant status: Secondary | ICD-10-CM | POA: Diagnosis not present

## 2023-07-10 DIAGNOSIS — K219 Gastro-esophageal reflux disease without esophagitis: Secondary | ICD-10-CM | POA: Diagnosis not present

## 2023-07-22 DIAGNOSIS — M722 Plantar fascial fibromatosis: Secondary | ICD-10-CM | POA: Diagnosis not present

## 2023-07-24 DIAGNOSIS — M6281 Muscle weakness (generalized): Secondary | ICD-10-CM | POA: Diagnosis not present

## 2023-07-24 DIAGNOSIS — M79672 Pain in left foot: Secondary | ICD-10-CM | POA: Diagnosis not present

## 2023-07-28 DIAGNOSIS — M6281 Muscle weakness (generalized): Secondary | ICD-10-CM | POA: Diagnosis not present

## 2023-07-28 DIAGNOSIS — M79672 Pain in left foot: Secondary | ICD-10-CM | POA: Diagnosis not present

## 2023-08-04 DIAGNOSIS — M6281 Muscle weakness (generalized): Secondary | ICD-10-CM | POA: Diagnosis not present

## 2023-08-04 DIAGNOSIS — M79672 Pain in left foot: Secondary | ICD-10-CM | POA: Diagnosis not present

## 2023-08-05 ENCOUNTER — Ambulatory Visit: Payer: 59 | Admitting: Podiatry

## 2023-08-06 DIAGNOSIS — M6281 Muscle weakness (generalized): Secondary | ICD-10-CM | POA: Diagnosis not present

## 2023-08-06 DIAGNOSIS — M79672 Pain in left foot: Secondary | ICD-10-CM | POA: Diagnosis not present

## 2023-08-07 ENCOUNTER — Other Ambulatory Visit: Payer: 59

## 2023-08-07 DIAGNOSIS — Z940 Kidney transplant status: Secondary | ICD-10-CM | POA: Diagnosis not present

## 2023-08-07 DIAGNOSIS — D649 Anemia, unspecified: Secondary | ICD-10-CM | POA: Diagnosis not present

## 2023-08-11 DIAGNOSIS — M6281 Muscle weakness (generalized): Secondary | ICD-10-CM | POA: Diagnosis not present

## 2023-08-11 DIAGNOSIS — E7800 Pure hypercholesterolemia, unspecified: Secondary | ICD-10-CM | POA: Diagnosis not present

## 2023-08-11 DIAGNOSIS — Z7952 Long term (current) use of systemic steroids: Secondary | ICD-10-CM | POA: Diagnosis not present

## 2023-08-11 DIAGNOSIS — K219 Gastro-esophageal reflux disease without esophagitis: Secondary | ICD-10-CM | POA: Diagnosis not present

## 2023-08-11 DIAGNOSIS — Z940 Kidney transplant status: Secondary | ICD-10-CM | POA: Diagnosis not present

## 2023-08-11 DIAGNOSIS — Z9889 Other specified postprocedural states: Secondary | ICD-10-CM | POA: Diagnosis not present

## 2023-08-11 DIAGNOSIS — N184 Chronic kidney disease, stage 4 (severe): Secondary | ICD-10-CM | POA: Diagnosis not present

## 2023-08-11 DIAGNOSIS — M79672 Pain in left foot: Secondary | ICD-10-CM | POA: Diagnosis not present

## 2023-08-11 DIAGNOSIS — M069 Rheumatoid arthritis, unspecified: Secondary | ICD-10-CM | POA: Diagnosis not present

## 2023-08-19 DIAGNOSIS — M79672 Pain in left foot: Secondary | ICD-10-CM | POA: Diagnosis not present

## 2023-08-19 DIAGNOSIS — M6281 Muscle weakness (generalized): Secondary | ICD-10-CM | POA: Diagnosis not present

## 2023-08-29 ENCOUNTER — Ambulatory Visit
Admission: RE | Admit: 2023-08-29 | Discharge: 2023-08-29 | Disposition: A | Discharge status: Home / Self Care | Payer: 59 | Source: Ambulatory Visit | Attending: Obstetrics and Gynecology | Admitting: Obstetrics and Gynecology

## 2023-08-29 ENCOUNTER — Ambulatory Visit: Payer: 59

## 2023-08-29 DIAGNOSIS — N641 Fat necrosis of breast: Secondary | ICD-10-CM | POA: Diagnosis not present

## 2023-08-29 DIAGNOSIS — N6310 Unspecified lump in the right breast, unspecified quadrant: Secondary | ICD-10-CM

## 2023-08-29 DIAGNOSIS — Z09 Encounter for follow-up examination after completed treatment for conditions other than malignant neoplasm: Secondary | ICD-10-CM | POA: Diagnosis not present

## 2023-09-08 DIAGNOSIS — Z940 Kidney transplant status: Secondary | ICD-10-CM | POA: Diagnosis not present

## 2023-09-09 DIAGNOSIS — Z9889 Other specified postprocedural states: Secondary | ICD-10-CM | POA: Diagnosis not present

## 2023-09-09 DIAGNOSIS — M069 Rheumatoid arthritis, unspecified: Secondary | ICD-10-CM | POA: Diagnosis not present

## 2023-09-09 DIAGNOSIS — N184 Chronic kidney disease, stage 4 (severe): Secondary | ICD-10-CM | POA: Diagnosis not present

## 2023-09-09 DIAGNOSIS — K219 Gastro-esophageal reflux disease without esophagitis: Secondary | ICD-10-CM | POA: Diagnosis not present

## 2023-09-09 DIAGNOSIS — Z7952 Long term (current) use of systemic steroids: Secondary | ICD-10-CM | POA: Diagnosis not present

## 2023-09-09 DIAGNOSIS — Z940 Kidney transplant status: Secondary | ICD-10-CM | POA: Diagnosis not present

## 2023-09-09 DIAGNOSIS — E7800 Pure hypercholesterolemia, unspecified: Secondary | ICD-10-CM | POA: Diagnosis not present

## 2023-10-07 DIAGNOSIS — K219 Gastro-esophageal reflux disease without esophagitis: Secondary | ICD-10-CM | POA: Diagnosis not present

## 2023-10-07 DIAGNOSIS — N184 Chronic kidney disease, stage 4 (severe): Secondary | ICD-10-CM | POA: Diagnosis not present

## 2023-10-07 DIAGNOSIS — M069 Rheumatoid arthritis, unspecified: Secondary | ICD-10-CM | POA: Diagnosis not present

## 2023-10-07 DIAGNOSIS — Z9889 Other specified postprocedural states: Secondary | ICD-10-CM | POA: Diagnosis not present

## 2023-10-07 DIAGNOSIS — E7800 Pure hypercholesterolemia, unspecified: Secondary | ICD-10-CM | POA: Diagnosis not present

## 2023-10-07 DIAGNOSIS — Z7952 Long term (current) use of systemic steroids: Secondary | ICD-10-CM | POA: Diagnosis not present

## 2023-10-07 DIAGNOSIS — Z940 Kidney transplant status: Secondary | ICD-10-CM | POA: Diagnosis not present

## 2023-10-08 DIAGNOSIS — Z940 Kidney transplant status: Secondary | ICD-10-CM | POA: Diagnosis not present

## 2023-10-22 DIAGNOSIS — Z940 Kidney transplant status: Secondary | ICD-10-CM | POA: Diagnosis not present

## 2023-11-10 DIAGNOSIS — I10 Essential (primary) hypertension: Secondary | ICD-10-CM | POA: Diagnosis not present

## 2023-11-10 DIAGNOSIS — D849 Immunodeficiency, unspecified: Secondary | ICD-10-CM | POA: Diagnosis not present

## 2023-11-10 DIAGNOSIS — M069 Rheumatoid arthritis, unspecified: Secondary | ICD-10-CM | POA: Diagnosis not present

## 2023-11-10 DIAGNOSIS — Z7969 Long term (current) use of other immunomodulators and immunosuppressants: Secondary | ICD-10-CM | POA: Diagnosis not present

## 2023-11-10 DIAGNOSIS — Z4822 Encounter for aftercare following kidney transplant: Secondary | ICD-10-CM | POA: Diagnosis not present

## 2023-11-10 DIAGNOSIS — Z940 Kidney transplant status: Secondary | ICD-10-CM | POA: Diagnosis not present

## 2023-11-14 DIAGNOSIS — D8481 Immunodeficiency due to conditions classified elsewhere: Secondary | ICD-10-CM | POA: Diagnosis not present

## 2023-11-14 DIAGNOSIS — Z940 Kidney transplant status: Secondary | ICD-10-CM | POA: Diagnosis not present

## 2023-11-14 DIAGNOSIS — B354 Tinea corporis: Secondary | ICD-10-CM | POA: Diagnosis not present

## 2023-11-14 DIAGNOSIS — E039 Hypothyroidism, unspecified: Secondary | ICD-10-CM | POA: Diagnosis not present

## 2023-11-14 DIAGNOSIS — K219 Gastro-esophageal reflux disease without esophagitis: Secondary | ICD-10-CM | POA: Diagnosis not present

## 2023-11-14 DIAGNOSIS — I10 Essential (primary) hypertension: Secondary | ICD-10-CM | POA: Diagnosis not present

## 2023-11-25 DIAGNOSIS — E039 Hypothyroidism, unspecified: Secondary | ICD-10-CM | POA: Diagnosis not present

## 2023-11-25 DIAGNOSIS — M069 Rheumatoid arthritis, unspecified: Secondary | ICD-10-CM | POA: Diagnosis not present

## 2023-11-25 DIAGNOSIS — K219 Gastro-esophageal reflux disease without esophagitis: Secondary | ICD-10-CM | POA: Diagnosis not present

## 2023-11-25 DIAGNOSIS — Z940 Kidney transplant status: Secondary | ICD-10-CM | POA: Diagnosis not present

## 2023-11-25 DIAGNOSIS — N184 Chronic kidney disease, stage 4 (severe): Secondary | ICD-10-CM | POA: Diagnosis not present

## 2023-11-25 DIAGNOSIS — J988 Other specified respiratory disorders: Secondary | ICD-10-CM | POA: Diagnosis not present

## 2023-11-25 DIAGNOSIS — G478 Other sleep disorders: Secondary | ICD-10-CM | POA: Diagnosis not present

## 2023-11-25 DIAGNOSIS — J3089 Other allergic rhinitis: Secondary | ICD-10-CM | POA: Diagnosis not present

## 2023-11-25 DIAGNOSIS — R0683 Snoring: Secondary | ICD-10-CM | POA: Diagnosis not present

## 2023-11-26 DIAGNOSIS — M069 Rheumatoid arthritis, unspecified: Secondary | ICD-10-CM | POA: Diagnosis not present

## 2023-11-26 DIAGNOSIS — K219 Gastro-esophageal reflux disease without esophagitis: Secondary | ICD-10-CM | POA: Diagnosis not present

## 2023-11-26 DIAGNOSIS — E7800 Pure hypercholesterolemia, unspecified: Secondary | ICD-10-CM | POA: Diagnosis not present

## 2023-11-26 DIAGNOSIS — N184 Chronic kidney disease, stage 4 (severe): Secondary | ICD-10-CM | POA: Diagnosis not present

## 2023-11-26 DIAGNOSIS — Z9889 Other specified postprocedural states: Secondary | ICD-10-CM | POA: Diagnosis not present

## 2023-12-02 ENCOUNTER — Other Ambulatory Visit: Payer: Self-pay | Admitting: Obstetrics and Gynecology

## 2023-12-02 DIAGNOSIS — Z1231 Encounter for screening mammogram for malignant neoplasm of breast: Secondary | ICD-10-CM

## 2023-12-08 DIAGNOSIS — Z79899 Other long term (current) drug therapy: Secondary | ICD-10-CM | POA: Diagnosis not present

## 2023-12-08 DIAGNOSIS — M0609 Rheumatoid arthritis without rheumatoid factor, multiple sites: Secondary | ICD-10-CM | POA: Diagnosis not present

## 2023-12-08 DIAGNOSIS — R5383 Other fatigue: Secondary | ICD-10-CM | POA: Diagnosis not present

## 2023-12-08 DIAGNOSIS — L732 Hidradenitis suppurativa: Secondary | ICD-10-CM | POA: Diagnosis not present

## 2023-12-09 DIAGNOSIS — Z940 Kidney transplant status: Secondary | ICD-10-CM | POA: Diagnosis not present

## 2023-12-17 ENCOUNTER — Other Ambulatory Visit: Payer: 59

## 2023-12-24 DIAGNOSIS — E7800 Pure hypercholesterolemia, unspecified: Secondary | ICD-10-CM | POA: Diagnosis not present

## 2023-12-24 DIAGNOSIS — M069 Rheumatoid arthritis, unspecified: Secondary | ICD-10-CM | POA: Diagnosis not present

## 2023-12-24 DIAGNOSIS — N184 Chronic kidney disease, stage 4 (severe): Secondary | ICD-10-CM | POA: Diagnosis not present

## 2023-12-24 DIAGNOSIS — Z9889 Other specified postprocedural states: Secondary | ICD-10-CM | POA: Diagnosis not present

## 2023-12-24 DIAGNOSIS — K219 Gastro-esophageal reflux disease without esophagitis: Secondary | ICD-10-CM | POA: Diagnosis not present

## 2023-12-25 DIAGNOSIS — Z7952 Long term (current) use of systemic steroids: Secondary | ICD-10-CM | POA: Diagnosis not present

## 2023-12-25 DIAGNOSIS — G4733 Obstructive sleep apnea (adult) (pediatric): Secondary | ICD-10-CM | POA: Diagnosis not present

## 2024-01-06 DIAGNOSIS — Z940 Kidney transplant status: Secondary | ICD-10-CM | POA: Diagnosis not present

## 2024-01-20 DIAGNOSIS — Z940 Kidney transplant status: Secondary | ICD-10-CM | POA: Diagnosis not present

## 2024-01-22 DIAGNOSIS — N184 Chronic kidney disease, stage 4 (severe): Secondary | ICD-10-CM | POA: Diagnosis not present

## 2024-01-22 DIAGNOSIS — K219 Gastro-esophageal reflux disease without esophagitis: Secondary | ICD-10-CM | POA: Diagnosis not present

## 2024-01-22 DIAGNOSIS — E7800 Pure hypercholesterolemia, unspecified: Secondary | ICD-10-CM | POA: Diagnosis not present

## 2024-01-22 DIAGNOSIS — M069 Rheumatoid arthritis, unspecified: Secondary | ICD-10-CM | POA: Diagnosis not present

## 2024-01-22 DIAGNOSIS — Z9889 Other specified postprocedural states: Secondary | ICD-10-CM | POA: Diagnosis not present

## 2024-01-29 DIAGNOSIS — I770 Arteriovenous fistula, acquired: Secondary | ICD-10-CM | POA: Diagnosis not present

## 2024-01-29 DIAGNOSIS — I129 Hypertensive chronic kidney disease with stage 1 through stage 4 chronic kidney disease, or unspecified chronic kidney disease: Secondary | ICD-10-CM | POA: Diagnosis not present

## 2024-01-29 DIAGNOSIS — Z79899 Other long term (current) drug therapy: Secondary | ICD-10-CM | POA: Diagnosis not present

## 2024-01-29 DIAGNOSIS — Z940 Kidney transplant status: Secondary | ICD-10-CM | POA: Diagnosis not present

## 2024-02-02 ENCOUNTER — Ambulatory Visit
Admission: RE | Admit: 2024-02-02 | Discharge: 2024-02-02 | Disposition: A | Discharge status: Home / Self Care | Source: Ambulatory Visit | Attending: Obstetrics and Gynecology | Admitting: Obstetrics and Gynecology

## 2024-02-02 DIAGNOSIS — Z1231 Encounter for screening mammogram for malignant neoplasm of breast: Secondary | ICD-10-CM | POA: Diagnosis not present

## 2024-02-04 DIAGNOSIS — Z940 Kidney transplant status: Secondary | ICD-10-CM | POA: Diagnosis not present

## 2024-02-19 DIAGNOSIS — M069 Rheumatoid arthritis, unspecified: Secondary | ICD-10-CM | POA: Diagnosis not present

## 2024-02-19 DIAGNOSIS — Z940 Kidney transplant status: Secondary | ICD-10-CM | POA: Diagnosis not present

## 2024-02-19 DIAGNOSIS — Z9889 Other specified postprocedural states: Secondary | ICD-10-CM | POA: Diagnosis not present

## 2024-02-19 DIAGNOSIS — N184 Chronic kidney disease, stage 4 (severe): Secondary | ICD-10-CM | POA: Diagnosis not present

## 2024-02-19 DIAGNOSIS — E7800 Pure hypercholesterolemia, unspecified: Secondary | ICD-10-CM | POA: Diagnosis not present

## 2024-02-19 DIAGNOSIS — K219 Gastro-esophageal reflux disease without esophagitis: Secondary | ICD-10-CM | POA: Diagnosis not present

## 2024-03-02 DIAGNOSIS — I129 Hypertensive chronic kidney disease with stage 1 through stage 4 chronic kidney disease, or unspecified chronic kidney disease: Secondary | ICD-10-CM | POA: Diagnosis not present

## 2024-03-02 DIAGNOSIS — Z940 Kidney transplant status: Secondary | ICD-10-CM | POA: Diagnosis not present

## 2024-03-03 DIAGNOSIS — Z940 Kidney transplant status: Secondary | ICD-10-CM | POA: Diagnosis not present

## 2024-03-18 DIAGNOSIS — M069 Rheumatoid arthritis, unspecified: Secondary | ICD-10-CM | POA: Diagnosis not present

## 2024-03-18 DIAGNOSIS — K219 Gastro-esophageal reflux disease without esophagitis: Secondary | ICD-10-CM | POA: Diagnosis not present

## 2024-03-18 DIAGNOSIS — N184 Chronic kidney disease, stage 4 (severe): Secondary | ICD-10-CM | POA: Diagnosis not present

## 2024-03-18 DIAGNOSIS — Z9889 Other specified postprocedural states: Secondary | ICD-10-CM | POA: Diagnosis not present

## 2024-03-18 DIAGNOSIS — E7800 Pure hypercholesterolemia, unspecified: Secondary | ICD-10-CM | POA: Diagnosis not present

## 2024-03-18 DIAGNOSIS — Z940 Kidney transplant status: Secondary | ICD-10-CM | POA: Diagnosis not present

## 2024-03-31 DIAGNOSIS — Z940 Kidney transplant status: Secondary | ICD-10-CM | POA: Diagnosis not present

## 2024-04-21 ENCOUNTER — Other Ambulatory Visit (HOSPITAL_BASED_OUTPATIENT_CLINIC_OR_DEPARTMENT_OTHER)

## 2024-07-16 ENCOUNTER — Telehealth: Payer: Self-pay | Admitting: Pharmacist

## 2024-07-16 NOTE — Progress Notes (Signed)
" ° °  07/16/2024  Patient ID: Elizabeth Griffin, female   DOB: [birth year], 64 y.o.   MRN: [id number]  Received renal adjustment question for Dr. Vernon regarding osteoporosis medications.  Having difficulty with cost, so they wanted to start Reclast.   I would not suggest it as her CrCl is a 28. For Reclast,  use contraindicated in CrCl <35 due to increased risk of nephrotoxicity and lack of efficacy and safety data. Only option would be Prolia, Forteo/Tymlos (can potentially get free with [employer] Cares PAP), or Evenity (but monitor closesly for low calcium).    Aloysius Lewis, PharmD, [facility] Phone Number: [phone]  "
